# Patient Record
Sex: Male | Born: 1950 | Race: White | Hispanic: No | Marital: Married | Smoking: Former smoker
Health system: Southern US, Community
[De-identification: ages and names within clinical notes are randomized; demographics above are authoritative.]

## PROBLEM LIST (undated history)

## (undated) DIAGNOSIS — I499 Cardiac arrhythmia, unspecified: Secondary | ICD-10-CM

## (undated) DIAGNOSIS — E119 Type 2 diabetes mellitus without complications: Secondary | ICD-10-CM

## (undated) DIAGNOSIS — E785 Hyperlipidemia, unspecified: Secondary | ICD-10-CM

## (undated) DIAGNOSIS — I779 Disorder of arteries and arterioles, unspecified: Secondary | ICD-10-CM

## (undated) DIAGNOSIS — Z87442 Personal history of urinary calculi: Secondary | ICD-10-CM

## (undated) DIAGNOSIS — I2510 Atherosclerotic heart disease of native coronary artery without angina pectoris: Secondary | ICD-10-CM

## (undated) DIAGNOSIS — I639 Cerebral infarction, unspecified: Secondary | ICD-10-CM

## (undated) DIAGNOSIS — E7800 Pure hypercholesterolemia, unspecified: Secondary | ICD-10-CM

## (undated) DIAGNOSIS — K469 Unspecified abdominal hernia without obstruction or gangrene: Secondary | ICD-10-CM

## (undated) DIAGNOSIS — I10 Essential (primary) hypertension: Secondary | ICD-10-CM

## (undated) DIAGNOSIS — K449 Diaphragmatic hernia without obstruction or gangrene: Secondary | ICD-10-CM

## (undated) DIAGNOSIS — J939 Pneumothorax, unspecified: Secondary | ICD-10-CM

## (undated) DIAGNOSIS — N529 Male erectile dysfunction, unspecified: Secondary | ICD-10-CM

## (undated) DIAGNOSIS — M5090 Cervical disc disorder, unspecified, unspecified cervical region: Secondary | ICD-10-CM

## (undated) DIAGNOSIS — Z951 Presence of aortocoronary bypass graft: Secondary | ICD-10-CM

## (undated) DIAGNOSIS — M1990 Unspecified osteoarthritis, unspecified site: Secondary | ICD-10-CM

## (undated) DIAGNOSIS — J449 Chronic obstructive pulmonary disease, unspecified: Secondary | ICD-10-CM

## (undated) HISTORY — DX: Essential (primary) hypertension: I10

## (undated) HISTORY — PX: CORONARY ARTERY BYPASS GRAFT: SHX141

## (undated) HISTORY — DX: Male erectile dysfunction, unspecified: N52.9

## (undated) HISTORY — DX: Cervical disc disorder, unspecified, unspecified cervical region: M50.90

## (undated) HISTORY — DX: Hyperlipidemia, unspecified: E78.5

## (undated) HISTORY — DX: Disorder of arteries and arterioles, unspecified: I77.9

## (undated) HISTORY — PX: HEMORROIDECTOMY: SUR656

## (undated) HISTORY — DX: Type 2 diabetes mellitus without complications: E11.9

## (undated) HISTORY — PX: CAROTID ARTERY ANGIOPLASTY: SHX1300

## (undated) HISTORY — DX: Diaphragmatic hernia without obstruction or gangrene: K44.9

---

## 2002-12-23 ENCOUNTER — Emergency Department (HOSPITAL_COMMUNITY): Admission: EM | Admit: 2002-12-23 | Discharge: 2002-12-23 | Payer: Self-pay

## 2003-08-18 ENCOUNTER — Ambulatory Visit (HOSPITAL_COMMUNITY): Admission: RE | Admit: 2003-08-18 | Discharge: 2003-08-18 | Payer: Self-pay | Admitting: Internal Medicine

## 2004-12-10 ENCOUNTER — Ambulatory Visit (HOSPITAL_COMMUNITY): Admission: RE | Admit: 2004-12-10 | Discharge: 2004-12-10 | Payer: Self-pay | Admitting: Orthopaedic Surgery

## 2005-05-08 ENCOUNTER — Other Ambulatory Visit: Admission: RE | Admit: 2005-05-08 | Discharge: 2005-05-08 | Payer: Self-pay | Admitting: Family Medicine

## 2008-04-01 ENCOUNTER — Observation Stay (HOSPITAL_COMMUNITY): Admission: RE | Admit: 2008-04-01 | Discharge: 2008-04-02 | Payer: Self-pay | Admitting: General Surgery

## 2008-04-01 ENCOUNTER — Encounter (INDEPENDENT_AMBULATORY_CARE_PROVIDER_SITE_OTHER): Payer: Self-pay | Admitting: General Surgery

## 2008-08-19 DIAGNOSIS — Z8673 Personal history of transient ischemic attack (TIA), and cerebral infarction without residual deficits: Secondary | ICD-10-CM

## 2008-08-19 HISTORY — DX: Personal history of transient ischemic attack (TIA), and cerebral infarction without residual deficits: Z86.73

## 2008-11-22 ENCOUNTER — Ambulatory Visit (HOSPITAL_COMMUNITY): Admission: RE | Admit: 2008-11-22 | Discharge: 2008-11-22 | Payer: Self-pay | Admitting: Family Medicine

## 2008-11-24 ENCOUNTER — Encounter: Payer: Self-pay | Admitting: Family Medicine

## 2008-12-01 ENCOUNTER — Encounter: Admission: RE | Admit: 2008-12-01 | Discharge: 2008-12-01 | Payer: Self-pay | Admitting: Cardiovascular Disease

## 2008-12-02 ENCOUNTER — Ambulatory Visit (HOSPITAL_COMMUNITY): Admission: RE | Admit: 2008-12-02 | Discharge: 2008-12-02 | Payer: Self-pay | Admitting: Cardiovascular Disease

## 2008-12-20 ENCOUNTER — Ambulatory Visit (HOSPITAL_COMMUNITY): Admission: RE | Admit: 2008-12-20 | Discharge: 2008-12-21 | Payer: Self-pay | Admitting: Interventional Radiology

## 2009-01-03 ENCOUNTER — Encounter: Payer: Self-pay | Admitting: Interventional Radiology

## 2009-03-27 ENCOUNTER — Ambulatory Visit (HOSPITAL_COMMUNITY): Admission: RE | Admit: 2009-03-27 | Discharge: 2009-03-27 | Payer: Self-pay | Admitting: Interventional Radiology

## 2009-03-31 ENCOUNTER — Ambulatory Visit: Payer: Self-pay | Admitting: Thoracic Surgery (Cardiothoracic Vascular Surgery)

## 2009-04-04 ENCOUNTER — Encounter: Payer: Self-pay | Admitting: Thoracic Surgery (Cardiothoracic Vascular Surgery)

## 2009-04-05 ENCOUNTER — Encounter
Admission: RE | Admit: 2009-04-05 | Discharge: 2009-04-05 | Payer: Self-pay | Admitting: Thoracic Surgery (Cardiothoracic Vascular Surgery)

## 2009-04-06 ENCOUNTER — Ambulatory Visit
Admission: RE | Admit: 2009-04-06 | Discharge: 2009-04-06 | Payer: Self-pay | Admitting: Thoracic Surgery (Cardiothoracic Vascular Surgery)

## 2009-04-06 ENCOUNTER — Inpatient Hospital Stay (HOSPITAL_COMMUNITY)
Admission: RE | Admit: 2009-04-06 | Discharge: 2009-04-10 | Payer: Self-pay | Admitting: Thoracic Surgery (Cardiothoracic Vascular Surgery)

## 2009-04-06 ENCOUNTER — Ambulatory Visit: Payer: Self-pay | Admitting: Thoracic Surgery (Cardiothoracic Vascular Surgery)

## 2009-05-01 ENCOUNTER — Encounter
Admission: RE | Admit: 2009-05-01 | Discharge: 2009-05-01 | Payer: Self-pay | Admitting: Thoracic Surgery (Cardiothoracic Vascular Surgery)

## 2009-05-01 ENCOUNTER — Ambulatory Visit: Payer: Self-pay | Admitting: Thoracic Surgery (Cardiothoracic Vascular Surgery)

## 2009-05-15 ENCOUNTER — Ambulatory Visit: Payer: Self-pay | Admitting: Thoracic Surgery (Cardiothoracic Vascular Surgery)

## 2009-05-15 ENCOUNTER — Encounter
Admission: RE | Admit: 2009-05-15 | Discharge: 2009-05-15 | Payer: Self-pay | Admitting: Thoracic Surgery (Cardiothoracic Vascular Surgery)

## 2009-05-23 ENCOUNTER — Ambulatory Visit (HOSPITAL_COMMUNITY): Admission: RE | Admit: 2009-05-23 | Discharge: 2009-05-23 | Payer: Self-pay | Admitting: Family Medicine

## 2009-06-20 ENCOUNTER — Ambulatory Visit: Payer: Self-pay | Admitting: Thoracic Surgery (Cardiothoracic Vascular Surgery)

## 2009-06-20 ENCOUNTER — Encounter: Admission: RE | Admit: 2009-06-20 | Discharge: 2009-06-20 | Payer: Self-pay | Admitting: Thoracic Surgery

## 2009-09-25 ENCOUNTER — Ambulatory Visit: Payer: Self-pay | Admitting: Thoracic Surgery (Cardiothoracic Vascular Surgery)

## 2010-09-09 ENCOUNTER — Encounter: Payer: Self-pay | Admitting: Family Medicine

## 2010-11-24 LAB — POCT I-STAT 3, ART BLOOD GAS (G3+)
Acid-Base Excess: 1 mmol/L (ref 0.0–2.0)
Acid-Base Excess: 1 mmol/L (ref 0.0–2.0)
Acid-Base Excess: 2 mmol/L (ref 0.0–2.0)
Bicarbonate: 26.4 mEq/L — ABNORMAL HIGH (ref 20.0–24.0)
Bicarbonate: 27.6 mEq/L — ABNORMAL HIGH (ref 20.0–24.0)
Bicarbonate: 28.9 mEq/L — ABNORMAL HIGH (ref 20.0–24.0)
Bicarbonate: 31.1 mEq/L — ABNORMAL HIGH (ref 20.0–24.0)
O2 Saturation: 100 %
O2 Saturation: 100 %
O2 Saturation: 100 %
O2 Saturation: 96 %
O2 Saturation: 96 %
O2 Saturation: 98 %
Patient temperature: 38.1
Patient temperature: 38.2
TCO2: 28 mmol/L (ref 0–100)
TCO2: 29 mmol/L (ref 0–100)
TCO2: 30 mmol/L (ref 0–100)
TCO2: 33 mmol/L (ref 0–100)
pCO2 arterial: 47.1 mmHg — ABNORMAL HIGH (ref 35.0–45.0)
pCO2 arterial: 49.1 mmHg — ABNORMAL HIGH (ref 35.0–45.0)
pCO2 arterial: 58.1 mmHg (ref 35.0–45.0)
pH, Arterial: 7.308 — ABNORMAL LOW (ref 7.350–7.450)
pH, Arterial: 7.372 (ref 7.350–7.450)
pH, Arterial: 7.376 (ref 7.350–7.450)
pH, Arterial: 7.377 (ref 7.350–7.450)
pO2, Arterial: 102 mmHg — ABNORMAL HIGH (ref 80.0–100.0)
pO2, Arterial: 270 mmHg — ABNORMAL HIGH (ref 80.0–100.0)
pO2, Arterial: 92 mmHg (ref 80.0–100.0)

## 2010-11-24 LAB — BASIC METABOLIC PANEL
BUN: 14 mg/dL (ref 6–23)
BUN: 20 mg/dL (ref 6–23)
BUN: 8 mg/dL (ref 6–23)
BUN: 13 mg/dL (ref 6–23)
CO2: 27 mEq/L (ref 19–32)
CO2: 32 mEq/L (ref 19–32)
Calcium: 7.9 mg/dL — ABNORMAL LOW (ref 8.4–10.5)
Calcium: 8.5 mg/dL (ref 8.4–10.5)
Calcium: 9 mg/dL (ref 8.4–10.5)
Chloride: 102 mEq/L (ref 96–112)
Chloride: 105 mEq/L (ref 96–112)
Creatinine, Ser: 0.9 mg/dL (ref 0.4–1.5)
GFR calc non Af Amer: >60 mL/min (ref >60)
GFR calc non Af Amer: >60 mL/min (ref >60)
Glucose, Bld: 126 mg/dL — ABNORMAL HIGH (ref 70–99)
Glucose, Bld: 181 mg/dL — ABNORMAL HIGH (ref 70–99)
Glucose, Bld: 166 mg/dL — ABNORMAL HIGH (ref 70–99)
Potassium: 3.9 mEq/L (ref 3.5–5.1)
Sodium: 134 mEq/L — ABNORMAL LOW (ref 135–145)
Sodium: 139 mEq/L (ref 135–145)

## 2010-11-24 LAB — CBC
HCT: 31.8 % — ABNORMAL LOW (ref 39.0–52.0)
HCT: 32.8 % — ABNORMAL LOW (ref 39.0–52.0)
HCT: 32.9 % — ABNORMAL LOW (ref 39.0–52.0)
HCT: 33 % — ABNORMAL LOW (ref 39.0–52.0)
HCT: 43.8 % (ref 39.0–52.0)
HCT: 45.4 % (ref 39.0–52.0)
Hemoglobin: 11.1 g/dL — ABNORMAL LOW (ref 13.0–17.0)
Hemoglobin: 11.3 g/dL — ABNORMAL LOW (ref 13.0–17.0)
Hemoglobin: 11.5 g/dL — ABNORMAL LOW (ref 13.0–17.0)
Hemoglobin: 15.3 g/dL (ref 13.0–17.0)
MCHC: 34.8 g/dL (ref 30.0–36.0)
MCHC: 34.8 g/dL (ref 30.0–36.0)
MCHC: 35 g/dL (ref 30.0–36.0)
MCHC: 35.2 g/dL (ref 30.0–36.0)
MCHC: 35.1 g/dL (ref 30.0–36.0)
MCV: 95.8 fL (ref 78.0–100.0)
MCV: 95.9 fL (ref 78.0–100.0)
MCV: 98.2 fL (ref 78.0–100.0)
MCV: 95.5 fL (ref 78.0–100.0)
Platelets: 110 10*3/uL — ABNORMAL LOW (ref 150–400)
Platelets: 126 10*3/uL — ABNORMAL LOW (ref 150–400)
Platelets: 147 10*3/uL — ABNORMAL LOW (ref 150–400)
Platelets: 149 10*3/uL — ABNORMAL LOW (ref 150–400)
Platelets: 223 10*3/uL (ref 150–400)
Platelets: 222 10*3/uL (ref 150–400)
RBC: 3.36 MIL/uL — ABNORMAL LOW (ref 4.22–5.81)
RBC: 4.57 MIL/uL (ref 4.22–5.81)
RDW: 12.9 % (ref 11.5–15.5)
RDW: 12.9 % (ref 11.5–15.5)
RDW: 13.2 % (ref 11.5–15.5)
RDW: 13.2 % (ref 11.5–15.5)
RDW: 13.4 % (ref 11.5–15.5)
RDW: 13.1 % (ref 11.5–15.5)
WBC: 14.9 10*3/uL — ABNORMAL HIGH (ref 4.0–10.5)
WBC: 15.2 10*3/uL — ABNORMAL HIGH (ref 4.0–10.5)
WBC: 9.1 10*3/uL (ref 4.0–10.5)

## 2010-11-24 LAB — BLOOD GAS, ARTERIAL
Acid-Base Excess: 3.8 mmol/L — ABNORMAL HIGH (ref 0.0–2.0)
Bicarbonate: 27.9 mEq/L — ABNORMAL HIGH (ref 20.0–24.0)
Drawn by: 206361
FIO2: 0.21 %
O2 Saturation: 96.9 %
Patient temperature: 98.6
TCO2: 29.2 mmol/L (ref 0–100)
pCO2 arterial: 42.9 mmHg (ref 35.0–45.0)
pH, Arterial: 7.429 (ref 7.350–7.450)
pO2, Arterial: 83 mmHg (ref 80.0–100.0)

## 2010-11-24 LAB — GLUCOSE, CAPILLARY
Glucose-Capillary: 109 mg/dL — ABNORMAL HIGH (ref 70–99)
Glucose-Capillary: 113 mg/dL — ABNORMAL HIGH (ref 70–99)
Glucose-Capillary: 126 mg/dL — ABNORMAL HIGH (ref 70–99)
Glucose-Capillary: 126 mg/dL — ABNORMAL HIGH (ref 70–99)
Glucose-Capillary: 127 mg/dL — ABNORMAL HIGH (ref 70–99)
Glucose-Capillary: 142 mg/dL — ABNORMAL HIGH (ref 70–99)
Glucose-Capillary: 142 mg/dL — ABNORMAL HIGH (ref 70–99)
Glucose-Capillary: 152 mg/dL — ABNORMAL HIGH (ref 70–99)
Glucose-Capillary: 162 mg/dL — ABNORMAL HIGH (ref 70–99)
Glucose-Capillary: 206 mg/dL — ABNORMAL HIGH (ref 70–99)
Glucose-Capillary: 87 mg/dL (ref 70–99)
Glucose-Capillary: 94 mg/dL (ref 70–99)

## 2010-11-24 LAB — URINALYSIS, ROUTINE W REFLEX MICROSCOPIC
Hgb urine dipstick: NEGATIVE
Leukocytes, UA: NEGATIVE
Nitrite: NEGATIVE
Protein, ur: NEGATIVE mg/dL
Specific Gravity, Urine: 1.021 (ref 1.005–1.030)
Urobilinogen, UA: 0.2 mg/dL (ref 0.0–1.0)

## 2010-11-24 LAB — POCT I-STAT 4, (NA,K, GLUC, HGB,HCT)
Glucose, Bld: 172 mg/dL — ABNORMAL HIGH (ref 70–99)
Glucose, Bld: 172 mg/dL — ABNORMAL HIGH (ref 70–99)
Glucose, Bld: 180 mg/dL — ABNORMAL HIGH (ref 70–99)
Glucose, Bld: 194 mg/dL — ABNORMAL HIGH (ref 70–99)
HCT: 29 % — ABNORMAL LOW (ref 39.0–52.0)
HCT: 31 % — ABNORMAL LOW (ref 39.0–52.0)
HCT: 32 % — ABNORMAL LOW (ref 39.0–52.0)
HCT: 42 % (ref 39.0–52.0)
Hemoglobin: 10.5 g/dL — ABNORMAL LOW (ref 13.0–17.0)
Hemoglobin: 10.9 g/dL — ABNORMAL LOW (ref 13.0–17.0)
Hemoglobin: 13.6 g/dL (ref 13.0–17.0)
Hemoglobin: 14.3 g/dL (ref 13.0–17.0)
Potassium: 3.6 mEq/L (ref 3.5–5.1)
Potassium: 4.4 mEq/L (ref 3.5–5.1)
Potassium: 4.5 mEq/L (ref 3.5–5.1)
Sodium: 128 mEq/L — ABNORMAL LOW (ref 135–145)
Sodium: 132 mEq/L — ABNORMAL LOW (ref 135–145)
Sodium: 134 mEq/L — ABNORMAL LOW (ref 135–145)
Sodium: 137 mEq/L (ref 135–145)

## 2010-11-24 LAB — COMPREHENSIVE METABOLIC PANEL
ALT: 21 U/L (ref 0–53)
AST: 20 U/L (ref 0–37)
Albumin: 3.8 g/dL (ref 3.5–5.2)
CO2: 24 mEq/L (ref 19–32)
Calcium: 9.5 mg/dL (ref 8.4–10.5)
Chloride: 105 mEq/L (ref 96–112)
GFR calc Af Amer: >60 mL/min (ref >60)
GFR calc non Af Amer: >60 mL/min (ref >60)
Sodium: 138 mEq/L (ref 135–145)

## 2010-11-24 LAB — POCT I-STAT, CHEM 8
BUN: 9 mg/dL (ref 6–23)
Chloride: 98 mEq/L (ref 96–112)
Creatinine, Ser: 0.9 mg/dL (ref 0.4–1.5)
Glucose, Bld: 160 mg/dL — ABNORMAL HIGH (ref 70–99)
HCT: 34 % — ABNORMAL LOW (ref 39.0–52.0)
Hemoglobin: 11.2 g/dL — ABNORMAL LOW (ref 13.0–17.0)
Hemoglobin: 11.6 g/dL — ABNORMAL LOW (ref 13.0–17.0)
Potassium: 4 mEq/L (ref 3.5–5.1)
Sodium: 136 mEq/L (ref 135–145)
Sodium: 140 mEq/L (ref 135–145)
TCO2: 25 mmol/L (ref 0–100)

## 2010-11-24 LAB — POCT I-STAT 3, VENOUS BLOOD GAS (G3P V)
Bicarbonate: 28.2 mEq/L — ABNORMAL HIGH (ref 20.0–24.0)
O2 Saturation: 75 %
TCO2: 30 mmol/L (ref 0–100)
pCO2, Ven: 52.4 mmHg — ABNORMAL HIGH (ref 45.0–50.0)
pH, Ven: 7.338 — ABNORMAL HIGH (ref 7.250–7.300)

## 2010-11-24 LAB — CREATININE, SERUM
Creatinine, Ser: 0.82 mg/dL (ref 0.4–1.5)
Creatinine, Ser: 0.87 mg/dL (ref 0.4–1.5)
GFR calc Af Amer: >60 mL/min (ref >60)
GFR calc non Af Amer: >60 mL/min (ref >60)
GFR calc non Af Amer: >60 mL/min (ref >60)

## 2010-11-24 LAB — PROTIME-INR
INR: 1 (ref 0.00–1.49)
INR: 1.3 (ref 0.00–1.49)
Prothrombin Time: 16.5 seconds — ABNORMAL HIGH (ref 11.6–15.2)

## 2010-11-24 LAB — URINE MICROSCOPIC-ADD ON

## 2010-11-24 LAB — ABO/RH: ABO/RH(D): A POS

## 2010-11-24 LAB — TYPE AND SCREEN: ABO/RH(D): A POS

## 2010-11-24 LAB — MAGNESIUM
Magnesium: 2.3 mg/dL (ref 1.5–2.5)
Magnesium: 2.6 mg/dL — ABNORMAL HIGH (ref 1.5–2.5)

## 2010-11-24 LAB — HEMOGLOBIN AND HEMATOCRIT, BLOOD: HCT: 29.9 % — ABNORMAL LOW (ref 39.0–52.0)

## 2010-11-24 LAB — PLATELET COUNT: Platelets: 163 10*3/uL (ref 150–400)

## 2010-11-27 LAB — CBC
HCT: 43.9 % (ref 39.0–52.0)
MCV: 97.9 fL (ref 78.0–100.0)
Platelets: 219 10*3/uL (ref 150–400)
WBC: 9.8 10*3/uL (ref 4.0–10.5)

## 2010-11-27 LAB — BASIC METABOLIC PANEL
BUN: 7 mg/dL (ref 6–23)
Chloride: 103 mEq/L (ref 96–112)
Glucose, Bld: 135 mg/dL — ABNORMAL HIGH (ref 70–99)
Potassium: 3.8 mEq/L (ref 3.5–5.1)

## 2010-11-27 LAB — URINALYSIS, DIPSTICK ONLY
Glucose, UA: NEGATIVE mg/dL
Ketones, ur: 15 mg/dL — AB
Protein, ur: NEGATIVE mg/dL

## 2010-11-27 LAB — HEPARIN LEVEL (UNFRACTIONATED): Heparin Unfractionated: <0.1 IU/mL — ABNORMAL LOW (ref 0.30–0.70)

## 2010-11-28 LAB — HEPATIC FUNCTION PANEL
ALT: 33 U/L (ref 0–53)
AST: 27 U/L (ref 0–37)
Albumin: 3.6 g/dL (ref 3.5–5.2)
Alkaline Phosphatase: 67 U/L (ref 39–117)
Total Bilirubin: 1.8 mg/dL — ABNORMAL HIGH (ref 0.3–1.2)
Total Protein: 6.4 g/dL (ref 6.0–8.3)

## 2010-11-28 LAB — CBC
HCT: 50.4 % (ref 39.0–52.0)
Hemoglobin: 17.5 g/dL — ABNORMAL HIGH (ref 13.0–17.0)
RBC: 5.12 MIL/uL (ref 4.22–5.81)
RDW: 12.4 % (ref 11.5–15.5)
WBC: 9.6 10*3/uL (ref 4.0–10.5)

## 2010-11-28 LAB — BASIC METABOLIC PANEL
Calcium: 10.2 mg/dL (ref 8.4–10.5)
GFR calc Af Amer: >60 mL/min (ref >60)
GFR calc non Af Amer: >60 mL/min (ref >60)
Potassium: 5.8 mEq/L — ABNORMAL HIGH (ref 3.5–5.1)
Sodium: 139 mEq/L (ref 135–145)

## 2010-11-28 LAB — LIPID PANEL
Cholesterol: 205 mg/dL — ABNORMAL HIGH (ref 0–200)
HDL: 27 mg/dL — ABNORMAL LOW (ref >39)
Total CHOL/HDL Ratio: 7.6 RATIO
VLDL: 48 mg/dL — ABNORMAL HIGH (ref 0–40)

## 2010-11-28 LAB — DIFFERENTIAL
Basophils Absolute: 0.1 10*3/uL (ref 0.0–0.1)
Eosinophils Relative: 7 % — ABNORMAL HIGH (ref 0–5)
Lymphocytes Relative: 20 % (ref 12–46)
Lymphs Abs: 1.9 10*3/uL (ref 0.7–4.0)
Monocytes Absolute: 1 10*3/uL (ref 0.1–1.0)
Neutro Abs: 6 10*3/uL (ref 1.7–7.7)

## 2010-11-28 LAB — PROTIME-INR: Prothrombin Time: 13.7 seconds (ref 11.6–15.2)

## 2010-11-28 LAB — APTT: aPTT: 30 seconds (ref 24–37)
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
## 2011-01-01 NOTE — Op Note (Signed)
Mark Rios, Mark Rios NO.:  [id number]   MEDICAL RECORD NO.:  [id number]          PATIENT TYPE:  INP   LOCATION:  2305                         FACILITY:  [facility]   PHYSICIAN:  Guadalupe Maple, M.D.  DATE OF BIRTH:  1951/03/19   DATE OF PROCEDURE:  04/06/2009  DATE OF DISCHARGE:                               OPERATIVE REPORT   PROCEDURE:  Intraoperative transesophageal echocardiography.   [name] is a 60 year old white male with a history of severe  coronary artery disease and left ventricular dysfunction who is  scheduled to undergo coronary artery bypass grafting by Dr. Dorris Fetch.  Intraoperative transesophageal echocardiography was requested to  evaluate the left and right ventricular function to serve as a monitor  for intraoperative volume status and to assess for any valvular  pathology.   The patient was brought to the operating room at [facility] and  following uneventful induction of general anesthesia and endotracheal  intubation, the transesophageal echocardiography probe was inserted into  the esophagus without difficulty.   IMPRESSION:  Prebypass Findings:  1. Aortic valve.  The aortic valve was trileaflet and opened normally.      There were no calcification of the leaflets, there was no evidence      of aortic stenosis, and no evidence of aortic insufficiency.  2. Mitral valve.  There was trace mitral insufficiency.  The leaflets      coapted well without prolapse or fluttering.  There was mild      thickening of the leaflets, but the valve otherwise appeared      normal.  3. Left ventricle.  There appeared to be diffuse hypokinesis of the      left ventricular myocardium, which appeared nonfocal.  Ejection      fraction was estimated to 25% to 30%.  Contractility appeared      reduced in all segments.  There was no thrombus noted in the left      ventricular apex.  Left ventricular wall thickness measured 0.9 to      1.0 cm at  the end diastole at the mid papillary level      concentrically.  4. Right ventricle.  The right ventricular function appeared to be      within normal limits.  There was good contractility of the right      ventricular free wall.  5. Tricuspid valve.  There was no tricuspid insufficiency that could      be appreciated and the valve leaflets appeared normal.  6. Interatrial septum.  The interatrial septum was intact without      evidence of patent foramen ovale or atrial septal defect by color      Doppler and bubble study.  7. Left atrium.  There was no thrombus noted in the left atrium or      left atrial appendage.  8. Ascending aorta.  The ascending aorta showed a well-defined aortic      root and sinotubular ridge and there did not appear to be any      severe atheromatous plaques noted or  mobile plaques appreciated.  9. Descending aorta.  The descending aorta appeared normal with no      atheromatous disease appreciated and measured 1.9 to 2.0 cm in      diameter.   Postbypass Findings:  1. Aortic valve.  The aortic valve was unchanged from the prebypass      study.  There was no aortic insufficiency and the valve opened      normally.  2. Mitral valve.  The mitral valve again showed trace mitral      insufficiency with good coaptation of the leaflets and no prolapse      or fluttering.  3. Left ventricle.  Again the left ventricular function was diminished      and there appeared to be global hypokinesis of the left ventricular      myocardium and ejection fraction was estimated at 30% to 35%.  4. Right ventricle.  There was good contractility of the right      ventricular free wall and normal-appearing right      ventricular function.  5. Tricuspid valve.  The tricuspid valve was unchanged from the      prebypass study.  There was no tricuspid insufficiency appreciated.           ______________________________  Guadalupe Maple, M.D.     DCJ/MEDQ  D:  04/06/2009  T:   04/07/2009  Job:  [id number]
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
## 2011-01-01 NOTE — Op Note (Signed)
NAMELAKEEM, ROZO NO.:  [id number]   MEDICAL RECORD NO.:  [id number]          PATIENT TYPE:  OBV   LOCATION:  A313                          FACILITY:  [facility]   PHYSICIAN:  Barbaraann Barthel, M.D. DATE OF BIRTH:  1950-10-14   DATE OF PROCEDURE:  04/01/2008  DATE OF DISCHARGE:                               OPERATIVE REPORT   This is 60 year old white male who had prolapsed bleeding internal  hemorrhoids.  These were treated with vigorous nonoperative care as an  outpatient.  He had really very little response to treatment.  He has  had no previous colonoscopy.  We tried to allow the inflammation to  subside somewhat with vigorous treatment a week or 4-5 days  preoperatively.  There was some response, but not very much.  Certainly,  we could not get a colonoscopy due to the amount of inflammation that  will be addressed later after we take care of this acute problem.   GROSS OPERATIVE FINDINGS:  The patient had some prolapsed bleeding with  focal necrosis area of hemorrhoids primarily in the left lateral bundle  with some perianal inflammation, it was subsiding.   Rigid proctoscopy was performed to 20 cm, this was normal other than the  inflammation at hand.   PROCEDURE:  1. Rigid proctoscopy.  2. Internal hemorrhoidectomy.   SPECIMEN:  Left lateral bundle hemorrhoid.   WOUND CLASSIFICATION:  Contaminated.   TECHNIQUE:  The patient was placed in the jackknife prone position.  The  buttocks were separated with tape.  Digital examination proctoscopy was  performed to 20 cm.  There were no abnormalities beyond the area that  was acutely inflamed to my vision.  We then prepped and draped in the  usual manner with Betadine and then irrigated his rectum as well and  then dissected then clamped the largest hemorrhoid which really took  care of most of the inflammation on the left lateral side and these were  clamped with a Buie hemorrhoidal clamp and removed and  oversewn doubly  using 2-0 gut suture.  This really improved the area quite a bit.  I did  not feel like any further  dissection was necessary in order to avoid any possibility of anal  stenosis.  We irrigated.  I used 0.5% Sensorcaine for perianal block and  used a dressing with 2% viscous Xylocaine for comfort measures.  The  patient will be admitted for observation, we will discharge him in the  a.m.      Barbaraann Barthel, M.D.  Electronically Signed     WB/MEDQ  D:  04/01/2008  T:  04/02/2008  Job:  [id number]   cc:   Angus G. Renard Matter, MD  Fax: [phone]

## 2011-01-01 NOTE — Assessment & Plan Note (Signed)
OFFICE VISIT   Mark, Rios  DOB:  [birth year]                                        May 01, 2009  CHART #:  [id number]   HISTORY:  The patient is a 60 year old gentleman who underwent coronary  bypass grafting x3 on August 19.  Postoperatively, he did well.  He is  not having significant complications.  He now returns for his scheduled  postoperative followup visit.  He says that he has been having pain.  He  has been taking 2-3 hydrocodone a day.  He has had some trouble  sleeping.  He had difficulty lying flat.  He has had some cough.  His  appetite has been poor.  He has been walking on a regular basis, but his  wife says he sleeps all the time and he is generally tired.   CURRENT MEDICATIONS:  Aspirin 325 mg daily, Plavix 75 mg daily,  simvastatin 40 mg daily, Niaspan 500 mg daily, metformin 500 mg b.i.d.,  Coreg 12.5 mg b.i.d., and p.r.n. Lortab 7.5/500.   ALLERGIES:  He has no known drug allergies.   PHYSICAL EXAMINATION:  The patient is a 60 year old gentleman in no  acute distress.  Blood pressure is 139/85, pulse 80, respirations were  18.  His oxygen saturation is 98% on room air.  His general affect is  somewhat flat.  Neurologically, he has no focal motor deficits.  He does  have some numbness on the fourth and fifth fingers of the left hand.  His sternal wound is healing well and the sternum is stable.  Chest tube  sites are healing well.  His leg incisions are healing well.  He does  have some hematoma along this saphenous venectomy tract.  Lungs have  diminished breath sounds at the left base.   IMAGING:  His chest x-ray shows a moderate left pleural effusion.   IMPRESSION:  The patient is a 60 year old gentleman.  He is now about 3  weeks out from surgery, and he is sort of at the low point in the  recovery phase.  I did encourage him to see some significant improvement  over the next 2-3 weeks.  He is still requiring some  pain medication.  I  gave him a prescription for additional 40 Lortab 7.5/500 tablets, he can  take one of those 3 times a day as needed for pain.  I discussed our  options of thoracentesis versus trying a steroid taper for his pleural  effusion biopsy.  Particularly with him on Plavix, I think it could be  reasonable to try a steroid taper first.  I am going to give him taper  from 50 mg to 10 mg over a 5-day period and then off.  He is still not  to lift any heavy objects, and he should not drive a car until he sees  me back in 2 weeks, at which time we will check another chest x-ray to  check on his progress.  Overall, I think the patient is doing fine.  He  should recover well, but he is still sort of at the nadir of the  recovery process at the present time.   Mark Rios, M.D.  Electronically Signed   SCH/MEDQ  D:  05/01/2009  T:  05/02/2009  Job:  [id number]   cc:  [name], M.D.  Mila Homer. Sudie Bailey, M.D.
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
## 2011-01-01 NOTE — Assessment & Plan Note (Signed)
OFFICE VISIT   Mark, Rios  DOB:  1950-10-09                                        June 20, 2009  CHART #:  [id number]   HISTORY OF PRESENT ILLNESS:  The patient returns today for followup.  He  is a 60 year old gentleman who had coronary bypass grafting on April 06, 2009.  He has been followed since that time.  He states that  presently he is having still some discomfort primarily along the right  side a couple of inches away from the incision.  He also still gets  short of breath particularly if he is walking up an incline.  He has  seen Dr. Allyson Sabal since his surgery.  He says he has a stress test  scheduled in December.  He has not had any chest pain consistent with  angina since his operation.   PHYSICAL EXAMINATION:  The patient is a 60 year old gentleman.  He is  anxious but no acute distress.  Blood pressure 129/76, pulse 87,  respirations 18, his oxygen saturation is 99% on room air.  His lungs  have diminished breath sounds at the left base.  Cardiac exam has a  regular rate and rhythm.  Normal S1 and S2.  No rubs, murmurs, or  gallops.  The sternum is stable.  The incision is well healed.  There is  point tenderness along the costal margin to the right of the sternum  primarily around the third and fourth costal cartilages.   IMPRESSION:  The patient is a 60 year old gentleman who is status post  coronary bypass grafting.  He is still having some discomfort which I  think is costochondritis.  I recommended that he try either Aleve or  Advil for that.  He also was complaining of shortness of breath with  exertion.  This has really been persistent since the surgery.  It is not  anything new.  I think a big component of this is his rather large  diaphragmatic hernia.  He has got a large amount of fatty tissue visible  in the left pleural space on his CT scan which was done back in August.  I reviewed those films with him and his wife today,  so they could be  clear as to what we are dealing with.  Roughly half the volume in his  left chest is filled with fatty omental tissue.  I once again explained  to them I thought it was absolutely necessary to have this repaired to  avoid permanent loss of lung function, and my initial impression would  be best to do this from the chest so that any adhesion in the chest  could be taken down to allow the tissue to be reduced and then defect in  the diaphragm could be closed.  This is a central defect that is not  associated with the hiatus and there does not appear to be any vital  organs in the chest which would prefer more urgent operation.  He also  has an umbilical hernia that he has talked to Dr. Malvin Johns in [place]  about needing that repaired.  He is going to check with Dr. Malvin Johns to  see if he would be comfortable trying to take care of the diaphragmatic  hernia as well, potentially that could be done as a laparoscopic type  approach and then this umbilical hernia could be fixed at the same time.  Otherwise, I would be happy to take care of the diaphragmatic hernia  through a thoracoscopic approach.  The patient will call us back after  he has seen Dr. Malvin Johns and left [place] know how he would like to proceed.   Mark Rios, M.D.  Electronically Signed   SCH/MEDQ  D:  06/20/2009  T:  06/20/2009  Job:  [id number]   cc:   Mark Rios, M.D.  Mark Rios, M.D.

## 2011-01-01 NOTE — Assessment & Plan Note (Signed)
OFFICE VISIT   Mark Rios, Mark Rios  DOB:  November 23, 1950                                        May 15, 2009  CHART #:  [id number]   The patient returns for followup.  He had coronary bypass grafting on  April 06, 2009.  He was last seen in the office on May 01, 2009,  at which time, his chest x-ray showed moderate pleural effusion.  He has  a known diaphragmatic hernia that we diagnosed preoperatively over  there.  He was treated with steroids, but now returns for followup of  that.  The patient states that he is still having some significant  incisional pain.  He has been taking 1-2 hydrocodone daily and still is  a little tender in the chest.  He has had a cough with some mucus  production.  He denies any fevers or chills.   PHYSICAL EXAMINATION:  General:  The patient is a 60 year old gentleman  in no acute distress.  Vital Signs:  His blood pressure is 137/80, pulse  88, respirations 18, and his oxygen saturation is 98% on room air.  Lungs:  Diminished breath sounds at the left base with dullness to  percussion.  Cardiac:  Regular rate and rhythm.  Normal S1 and S2.  No  rubs or murmurs.  Extremities:  There is no peripheral edema.  Chest:  His incision is clean, dry, and intact.  The sternum is stable.   His chest x-ray shows a pleural effusion in association with a  preexisting diaphragmatic hernia.  I recommended to the patient that we  attempt to drain this fluid with thoracentesis.  The risk of bleeding  and pneumothorax were discussed.  He understands and accepts the risks  and agrees to proceed.   PROCEDURE NOTE:  Using sterile technique and 1% lidocaine local  anesthetic, attempts were made to aspirate a left pleural effusion and 2  different interspaces.  No fluid was obtained on either attempt.  The  patient did have some discomfort particularly with injection of  lidocaine, but otherwise tolerated well.   IMPRESSION:  The patient  is a 60 year old gentleman status post coronary  bypass grafting.  He has a left diaphragmatic hernia with some  peritoneal fat within the chest cavity, it appears that he has pleural  effusion on top of that, but an attempt to drain the pleural effusion  was unsuccessful.  I suspect the fluids are relatively loculated or  maybe displacements of normal location because of the diaphragmatic  hernia.  I recommended to the patient that we treat again with steroids.  I will see him back in about 5 weeks.  I once again discussed the need  for eventual repair of that hernia.  There is no vital organs within the  hernia sac at present time, but that could be a subjective change, but  that is not an urgent procedure and can be done once he is fully  recovered from his bypass surgery.   I will plan to see the patient back in about 5 weeks.  We did give a  prescription for prednisone taper from 50 to 10 mg over 5 days.  He does  not need any additional pain medication at this time, but we will call  if he does need some.   Salvatore Decent  Dorris Fetch, M.D.  Electronically Signed   SCH/MEDQ  D:  05/15/2009  T:  05/16/2009  Job:  [id number]   cc:   [name], M.D.  Mila Homer. Sudie Bailey, M.D.

## 2011-05-17 LAB — BASIC METABOLIC PANEL
CO2: 31
Calcium: 9.1
Chloride: 103
Creatinine, Ser: 0.88
GFR calc Af Amer: >60
Sodium: 137

## 2011-05-17 LAB — CBC
Hemoglobin: 16.9
RBC: 4.9
WBC: 9.8

## 2011-05-17 LAB — DIFFERENTIAL
Lymphs Abs: 2.4
Monocytes Absolute: 0.9
Monocytes Relative: 9
Neutro Abs: 6.1
Neutrophils Relative %: 62

## 2013-03-01 ENCOUNTER — Emergency Department (HOSPITAL_COMMUNITY): Payer: Medicare Other

## 2013-03-01 ENCOUNTER — Encounter (HOSPITAL_COMMUNITY): Payer: Self-pay | Admitting: Emergency Medicine

## 2013-03-01 ENCOUNTER — Emergency Department (HOSPITAL_COMMUNITY)
Admission: EM | Admit: 2013-03-01 | Discharge: 2013-03-01 | Disposition: A | Discharge status: Home / Self Care | Payer: Medicare Other | Attending: Emergency Medicine | Admitting: Emergency Medicine

## 2013-03-01 DIAGNOSIS — E119 Type 2 diabetes mellitus without complications: Secondary | ICD-10-CM | POA: Insufficient documentation

## 2013-03-01 DIAGNOSIS — Z7982 Long term (current) use of aspirin: Secondary | ICD-10-CM | POA: Insufficient documentation

## 2013-03-01 DIAGNOSIS — R630 Anorexia: Secondary | ICD-10-CM | POA: Insufficient documentation

## 2013-03-01 DIAGNOSIS — R300 Dysuria: Secondary | ICD-10-CM | POA: Insufficient documentation

## 2013-03-01 DIAGNOSIS — R109 Unspecified abdominal pain: Secondary | ICD-10-CM

## 2013-03-01 DIAGNOSIS — I10 Essential (primary) hypertension: Secondary | ICD-10-CM | POA: Insufficient documentation

## 2013-03-01 DIAGNOSIS — F17200 Nicotine dependence, unspecified, uncomplicated: Secondary | ICD-10-CM | POA: Insufficient documentation

## 2013-03-01 DIAGNOSIS — R509 Fever, unspecified: Secondary | ICD-10-CM | POA: Insufficient documentation

## 2013-03-01 DIAGNOSIS — Z8709 Personal history of other diseases of the respiratory system: Secondary | ICD-10-CM | POA: Insufficient documentation

## 2013-03-01 DIAGNOSIS — R61 Generalized hyperhidrosis: Secondary | ICD-10-CM | POA: Insufficient documentation

## 2013-03-01 DIAGNOSIS — J449 Chronic obstructive pulmonary disease, unspecified: Secondary | ICD-10-CM | POA: Insufficient documentation

## 2013-03-01 DIAGNOSIS — Z8719 Personal history of other diseases of the digestive system: Secondary | ICD-10-CM | POA: Insufficient documentation

## 2013-03-01 DIAGNOSIS — G8929 Other chronic pain: Secondary | ICD-10-CM | POA: Insufficient documentation

## 2013-03-01 DIAGNOSIS — Z8679 Personal history of other diseases of the circulatory system: Secondary | ICD-10-CM | POA: Insufficient documentation

## 2013-03-01 DIAGNOSIS — H539 Unspecified visual disturbance: Secondary | ICD-10-CM | POA: Insufficient documentation

## 2013-03-01 DIAGNOSIS — Z79899 Other long term (current) drug therapy: Secondary | ICD-10-CM | POA: Insufficient documentation

## 2013-03-01 DIAGNOSIS — E7800 Pure hypercholesterolemia, unspecified: Secondary | ICD-10-CM | POA: Insufficient documentation

## 2013-03-01 DIAGNOSIS — J4489 Other specified chronic obstructive pulmonary disease: Secondary | ICD-10-CM | POA: Insufficient documentation

## 2013-03-01 HISTORY — DX: Type 2 diabetes mellitus without complications: E11.9

## 2013-03-01 HISTORY — DX: Essential (primary) hypertension: I10

## 2013-03-01 HISTORY — DX: Pure hypercholesterolemia, unspecified: E78.00

## 2013-03-01 HISTORY — DX: Unspecified abdominal hernia without obstruction or gangrene: K46.9

## 2013-03-01 HISTORY — DX: Cerebral infarction, unspecified: I63.9

## 2013-03-01 HISTORY — DX: Cardiac arrhythmia, unspecified: I49.9

## 2013-03-01 HISTORY — DX: Pneumothorax, unspecified: J93.9

## 2013-03-01 HISTORY — DX: Chronic obstructive pulmonary disease, unspecified: J44.9

## 2013-03-01 LAB — CBC WITH DIFFERENTIAL/PLATELET
Basophils Relative: 5 % — ABNORMAL HIGH (ref 0–1)
Eosinophils Absolute: 0.2 10*3/uL (ref 0.0–0.7)
Eosinophils Relative: 1 % (ref 0–5)
HCT: 44.7 % (ref 39.0–52.0)
Hemoglobin: 15.7 g/dL (ref 13.0–17.0)
Lymphs Abs: 6.3 10*3/uL — ABNORMAL HIGH (ref 0.7–4.0)
MCH: 31.6 pg (ref 26.0–34.0)
MCHC: 35.1 g/dL (ref 30.0–36.0)
MCV: 89.9 fL (ref 78.0–100.0)
Monocytes Absolute: 1.2 10*3/uL — ABNORMAL HIGH (ref 0.1–1.0)
Monocytes Relative: 11 % (ref 3–12)
RBC: 4.97 MIL/uL (ref 4.22–5.81)

## 2013-03-01 LAB — URINALYSIS, ROUTINE W REFLEX MICROSCOPIC: Glucose, UA: NEGATIVE mg/dL

## 2013-03-01 LAB — BASIC METABOLIC PANEL
BUN: 19 mg/dL (ref 6–23)
CO2: 27 mEq/L (ref 19–32)
Calcium: 9.3 mg/dL (ref 8.4–10.5)
Creatinine, Ser: 0.96 mg/dL (ref 0.50–1.35)
Glucose, Bld: 116 mg/dL — ABNORMAL HIGH (ref 70–99)
Sodium: 132 mEq/L — ABNORMAL LOW (ref 135–145)

## 2013-03-01 LAB — GLUCOSE, CAPILLARY: Glucose-Capillary: 107 mg/dL — ABNORMAL HIGH (ref 70–99)

## 2013-03-01 MED ORDER — IOHEXOL 300 MG/ML  SOLN
50.0000 mL | Freq: Once | INTRAMUSCULAR | Status: AC | PRN
  Administered 2013-03-01: 50 mL via ORAL

## 2013-03-01 MED ORDER — HYDROCODONE-ACETAMINOPHEN 5-325 MG PO TABS: 1.0000 | ORAL_TABLET | Freq: Four times a day (QID) | ORAL | Status: DC | PRN

## 2013-03-01 MED ORDER — IOHEXOL 300 MG/ML  SOLN
100.0000 mL | Freq: Once | INTRAMUSCULAR | Status: AC | PRN
  Administered 2013-03-01: 100 mL via INTRAVENOUS

## 2013-03-01 MED ORDER — SODIUM CHLORIDE 0.9 % IV SOLN: INTRAVENOUS | Status: DC

## 2013-03-01 MED ORDER — ONDANSETRON HCL 4 MG/2ML IJ SOLN
4.0000 mg | Freq: Once | INTRAMUSCULAR | Status: AC
  Administered 2013-03-01: 4 mg via INTRAVENOUS
  Filled 2013-03-01: qty 2

## 2013-03-01 MED ORDER — SODIUM CHLORIDE 0.9 % IV BOLUS (SEPSIS)
1000.0000 mL | Freq: Once | INTRAVENOUS | Status: AC
  Administered 2013-03-01: 1000 mL via INTRAVENOUS

## 2013-03-01 MED ORDER — HYDROMORPHONE HCL PF 1 MG/ML IJ SOLN
1.0000 mg | Freq: Once | INTRAMUSCULAR | Status: AC
  Administered 2013-03-01: 1 mg via INTRAVENOUS
  Filled 2013-03-01: qty 1

## 2013-03-01 NOTE — ED Notes (Addendum)
Patient c/o lower abd pain. Per patient has had abd pain x4 years but has gotten worse in past 2 weeks. Per patient decrease in appetite, "hot flashes, cold chills, and occasional dizzy spells." Patient reports abd hernia. Denies any nausea, vomiting, diarrhea, or constipation. Last BM this morning-per patient normal.

## 2013-03-01 NOTE — ED Notes (Signed)
Per wife patient is extremely sleepy with no energy recently. Per wife patient is usually very active.

## 2013-03-01 NOTE — ED Notes (Signed)
Patient would like to know results. MD at bedside.

## 2013-03-01 NOTE — ED Notes (Signed)
MD at bedside. 

## 2013-03-01 NOTE — ED Provider Notes (Signed)
History  This chart was scribed for Shelda Jakes, MD by Bennett Scrape, ED Scribe. This patient was seen in room [facility]09/[facility]09 and the patient's care was started at 9:03 AM.  CSN: [id number]  Arrival date & time 03/01/13  0825   First MD Initiated Contact with Patient 03/01/13 [phone]     Chief Complaint  Patient presents with  . Abdominal Pain    Patient is a 62 y.o. male presenting with abdominal pain. The history is provided by the patient. No language interpreter was used.  Abdominal Pain This is a chronic problem. The current episode started more than 1 week ago. The problem occurs constantly. The problem has been gradually worsening. Associated symptoms include abdominal pain. Pertinent negatives include no chest pain, no headaches and no shortness of breath. Exacerbated by: touch.    HPI Comments: Mark Rios is a 63 y.o. male with a h/o umbilical hernia x4 years who presents to the Emergency Department complaining of 2 weeks of worsening of chronic RLQ and periumblical abdominal pain. The pain occasionally radiates into the LLQ and he rates his pain a 6 out of 10 currently and a 10 out of 10 at its worst. He reports associated decreased appetite, chills, episodes of diaphoresis, blurred vision and subjective fevers since the onset. Pt denies any noticeable changes in the hernia since the onset. He reports that his PCP is aware of the hernia but has not referred him to a surgeon or GI specialist. He denies nausea, emesis, diarrhea and SOB as associated symptoms. He has a h/o DM, HTN and COPD. Pt is a current everyday smoker but denies alcohol use.  PCP is Dr. Sudie Bailey   Past Medical History  Diagnosis Date  . Abdominal hernia   . Diabetes mellitus without complication   . COPD (chronic obstructive pulmonary disease)   . Hypertension   . High cholesterol   . Stroke   . Arrhythmia   . Pneumothorax    Past Surgical History  Procedure Laterality Date  . Coronary artery  bypass graft      x3  . Hemorroidectomy     History reviewed. No pertinent family history. History  Substance Use Topics  . Smoking status: Current Some Day Smoker -- 48 years    Types: Cigarettes  . Smokeless tobacco: Never Used  . Alcohol Use: No    Review of Systems  Constitutional: Positive for fever, chills, diaphoresis and appetite change.  HENT: Negative for congestion, sore throat and neck pain.   Eyes: Positive for visual disturbance (blurred vision x2-3 weeks).  Respiratory: Negative for cough and shortness of breath.   Cardiovascular: Negative for chest pain and leg swelling.  Gastrointestinal: Positive for abdominal pain. Negative for nausea, vomiting and diarrhea.  Genitourinary: Positive for dysuria (mild). Negative for hematuria.  Musculoskeletal: Negative for back pain.  Skin: Negative for rash.  Neurological: Negative for headaches.  Hematological: Does not bruise/bleed easily.  Psychiatric/Behavioral: Negative for confusion.    Allergies  Review of patient's allergies indicates no known allergies.  Home Medications   Current Outpatient Rx  Name  Route  Sig  Dispense  Refill  . albuterol (PROVENTIL HFA;VENTOLIN HFA) 108 (90 BASE) MCG/ACT inhaler   Inhalation   Inhale 2 puffs into the lungs every 6 (six) hours as needed for wheezing or shortness of breath.         Marland Kitchen albuterol (PROVENTIL) (2.5 MG/3ML) 0.083% nebulizer solution   Nebulization   Take 2.5 mg by nebulization every  4 (four) hours as needed for wheezing.         Marland Kitchen aspirin 325 MG EC tablet   Oral   Take 325 mg by mouth daily.         . carvedilol (COREG) 12.5 MG tablet   Oral   Take 12.5 mg by mouth 2 (two) times daily with a meal.         . ipratropium (ATROVENT) 0.02 % nebulizer solution   Nebulization   Take 500 mcg by nebulization every 4 (four) hours as needed for wheezing.         Marland Kitchen lisinopril (PRINIVIL,ZESTRIL) 10 MG tablet   Oral   Take 10 mg by mouth daily.          . metFORMIN (GLUCOPHAGE) 500 MG tablet   Oral   Take 500 mg by mouth 2 (two) times daily with a meal.         . niacin (NIASPAN) 500 MG CR tablet   Oral   Take 500 mg by mouth at bedtime.         . simvastatin (ZOCOR) 40 MG tablet   Oral   Take 40 mg by mouth every evening.         Marland Kitchen HYDROcodone-acetaminophen (NORCO/VICODIN) 5-325 MG per tablet   Oral   Take 1-2 tablets by mouth every 6 (six) hours as needed for pain.   14 tablet   0    Triage Vitals: BP 84/46  Pulse 102  Temp(Src) 98 F (36.7 C) (Oral)  Resp 16  Ht 5\' 8"  (1.727 m)  Wt 165 lb (74.844 kg)  BMI 25.09 kg/m2  SpO2 93%  Physical Exam  Nursing note and vitals reviewed. Constitutional: He is oriented to person, place, and time. He appears well-developed and well-nourished. No distress.  HENT:  Head: Normocephalic and atraumatic.  Dry MM  Eyes: Conjunctivae and EOM are normal. Pupils are equal, round, and reactive to light.  Sclera are clear  Neck: Neck supple. No tracheal deviation present.  Cardiovascular: Normal rate and regular rhythm.   No murmur heard. Pulses:      Dorsalis pedis pulses are 2+ on the right side, and 2+ on the left side.  Pulmonary/Chest: Effort normal and breath sounds normal. No respiratory distress. He has no wheezes.  Abdominal: Soft. Bowel sounds are normal. He exhibits no distension. There is tenderness.  Tenderness at the umbilicus with a small bulge, no erythema   Musculoskeletal: Normal range of motion. He exhibits no edema (no ankle swelling).  Lymphadenopathy:    He has no cervical adenopathy.  Neurological: He is alert and oriented to person, place, and time. No cranial nerve deficit.  Pt able to move both sets of fingers and toes  Skin: Skin is warm and dry. No rash noted.  Psychiatric: He has a normal mood and affect. His behavior is normal.    ED Course  Procedures (including critical care time)  DIAGNOSTIC STUDIES: Oxygen Saturation is 94% on room air,  adequate by my interpretation.    COORDINATION OF CARE: 9:33 AM-Discussed treatment plan which includes CT of abdomen, CBC panel, BMP and UA with pt at bedside and pt agreed to plan.   Labs Reviewed  CBC WITH DIFFERENTIAL - Abnormal; Notable for the following:    WBC 10.9 (*)    Neutrophils Relative % 25 (*)    Lymphocytes Relative 58 (*)    Lymphs Abs 6.3 (*)    Monocytes Absolute 1.2 (*)  Basophils Relative 5 (*)    Basophils Absolute 0.5 (*)    All other components within normal limits  BASIC METABOLIC PANEL - Abnormal; Notable for the following:    Sodium 132 (*)    Glucose, Bld 116 (*)    GFR calc non Af Amer 88 (*)    All other components within normal limits  URINALYSIS, ROUTINE W REFLEX MICROSCOPIC - Abnormal; Notable for the following:    Hgb urine dipstick TRACE (*)    Bilirubin Urine SMALL (*)    Ketones, ur TRACE (*)    Protein, ur TRACE (*)    All other components within normal limits  GLUCOSE, CAPILLARY - Abnormal; Notable for the following:    Glucose-Capillary 107 (*)    All other components within normal limits  URINE MICROSCOPIC-ADD ON  PATHOLOGIST SMEAR REVIEW    Date: 03/01/2013  Rate: 100  Rhythm: normal sinus rhythm  QRS Axis: normal  Intervals: normal  ST/T Wave abnormalities: normal  Conduction Disutrbances:none  Narrative Interpretation:   Old EKG Reviewed: changes noted  Today's EKG with occasional premature ventricular traction. Occasional premature atrial contraction. Has left axis deviation. Other than the PVCs no significant change in EKG compared to 04/07/2009.  Results for orders placed during the hospital encounter of 03/01/13  CBC WITH DIFFERENTIAL      Result Value Range   WBC 10.9 (*) 4.0 - 10.5 K/uL   RBC 4.97  4.22 - 5.81 MIL/uL   Hemoglobin 15.7  13.0 - 17.0 g/dL   HCT 47.8  29.5 - 62.1 %   MCV 89.9  78.0 - 100.0 fL   MCH 31.6  26.0 - 34.0 pg   MCHC 35.1  30.0 - 36.0 g/dL   RDW 30.8  65.7 - 84.6 %   Platelets 177  150 -  400 K/uL   Neutrophils Relative % 25 (*) 43 - 77 %   Neutro Abs 2.8  1.7 - 7.7 K/uL   Lymphocytes Relative 58 (*) 12 - 46 %   Lymphs Abs 6.3 (*) 0.7 - 4.0 K/uL   Monocytes Relative 11  3 - 12 %   Monocytes Absolute 1.2 (*) 0.1 - 1.0 K/uL   Eosinophils Relative 1  0 - 5 %   Eosinophils Absolute 0.2  0.0 - 0.7 K/uL   Basophils Relative 5 (*) 0 - 1 %   Basophils Absolute 0.5 (*) 0.0 - 0.1 K/uL   WBC Morphology ATYPICAL LYMPHOCYTES    BASIC METABOLIC PANEL      Result Value Range   Sodium 132 (*) 135 - 145 mEq/L   Potassium 4.5  3.5 - 5.1 mEq/L   Chloride 97  96 - 112 mEq/L   CO2 27  19 - 32 mEq/L   Glucose, Bld 116 (*) 70 - 99 mg/dL   BUN 19  6 - 23 mg/dL   Creatinine, Ser 9.62  0.50 - 1.35 mg/dL   Calcium 9.3  8.4 - 95.2 mg/dL   GFR calc non Af Amer 88 (*) >90 mL/min   GFR calc Af Amer >90  >90 mL/min  URINALYSIS, ROUTINE W REFLEX MICROSCOPIC      Result Value Range   Color, Urine YELLOW  YELLOW   APPearance CLEAR  CLEAR   Specific Gravity, Urine 1.020  1.005 - 1.030   pH 6.0  5.0 - 8.0   Glucose, UA NEGATIVE  NEGATIVE mg/dL   Hgb urine dipstick TRACE (*) NEGATIVE   Bilirubin Urine SMALL (*) NEGATIVE  Ketones, ur TRACE (*) NEGATIVE mg/dL   Protein, ur TRACE (*) NEGATIVE mg/dL   Urobilinogen, UA 0.2  0.0 - 1.0 mg/dL   Nitrite NEGATIVE  NEGATIVE   Leukocytes, UA NEGATIVE  NEGATIVE  GLUCOSE, CAPILLARY      Result Value Range   Glucose-Capillary 107 (*) 70 - 99 mg/dL  URINE MICROSCOPIC-ADD ON      Result Value Range   RBC / HPF 0-2  <3 RBC/hpf     Ct Abdomen Pelvis W Contrast  03/01/2013   *RADIOLOGY REPORT*  Clinical Data: Right upper quadrant abdominal pain for 2 weeks, history of diabetes, hypertension, COPD, hemorrhoids, coronary artery disease post CABG, umbilical hernia, hypercholesterolemia  CT ABDOMEN AND PELVIS WITH CONTRAST  Technique:  Multidetector CT imaging of the abdomen and pelvis was performed following the standard protocol during bolus administration of  intravenous contrast. Sagittal and coronal MPR images reconstructed from axial data set.  Contrast: 50mL OMNIPAQUE IOHEXOL 300 MG/ML  SOLN, OMNIPAQUE IOHEXOL 300 MG/ML  SOLN  Comparison: None  Findings: Subsegmental atelectasis right lower lobe. Large amount of fat at inferior aspect of the left hemithorax appears to be due to diaphragmatic herniation of intra-abdominal fat, diaphragmatic defect approximately 2.2 cm diameter image 13. Small nodules within the fat likely represent lymph nodes, largest 9 mm short axis. Dependent density in gallbladder question tiny gallstones. No biliary dilatation.  Small cyst near dome of liver 11 x 8 mm image 12. Remainder of liver, spleen, pancreas, kidneys, and adrenal glands normal appearance. Tiny umbilical hernia containing fat, fascial defect 7 mm diameter. Diverticulosis of the descending and sigmoid colon without evidence of diverticulitis. Atherosclerotic calcifications aorta and coronary arteries with significant calcified plaque at proximal SMA and at celiac artery origin.  Stomach and remaining bowel loops normal appearance. Normal appendix. Mildly enlarged periportal lymph nodes 11-13 mm short axis images 19, 20, & 22. No mass, free fluid, inflammatory process or free air. No acute osseous findings.  IMPRESSION: Nonspecific minimally enlarged periportal lymph nodes. Question cholelithiasis without gross evidence of cholecystitis or biliary dilatation. Tiny umbilical hernia containing fat. 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 Sigmoid and descending colonic diverticulosis. Significant atherosclerotic disease.   Original Report Authenticated By: Ulyses Southward, M.D.    1. Abdominal pain     MDM  Patient workup without specific findings. Patient feeling much better currently once to be discharged home. Review of vital signs while in the emergency department showed one isolated blood pressure  below 90. Patient's had some other blood pressures in the 90s mostly upper 90s. Patient feels fine abdomen soft nontender no acute surgical abdomen. We'll discharge patient home he will return for any new or worse symptoms. He has referral to followup with his primary care Dr. Maryclare Labrador treat with pain medication.  I personally performed the services described in this documentation, which was scribed in my presence. The recorded information has been reviewed and is accurate.      Shelda Jakes, MD 03/01/13 [phone]

## 2014-06-28 ENCOUNTER — Emergency Department (HOSPITAL_COMMUNITY)
Admission: EM | Admit: 2014-06-28 | Discharge: 2014-06-28 | Disposition: A | Discharge status: Home / Self Care | Payer: Medicare Other | Source: Home / Self Care | Attending: Emergency Medicine | Admitting: Emergency Medicine

## 2014-06-28 ENCOUNTER — Emergency Department (HOSPITAL_COMMUNITY): Payer: Medicare Other

## 2014-06-28 ENCOUNTER — Encounter (HOSPITAL_COMMUNITY): Payer: Self-pay

## 2014-06-28 DIAGNOSIS — R1033 Periumbilical pain: Secondary | ICD-10-CM | POA: Insufficient documentation

## 2014-06-28 DIAGNOSIS — Z9889 Other specified postprocedural states: Secondary | ICD-10-CM

## 2014-06-28 DIAGNOSIS — R109 Unspecified abdominal pain: Secondary | ICD-10-CM

## 2014-06-28 DIAGNOSIS — J449 Chronic obstructive pulmonary disease, unspecified: Secondary | ICD-10-CM

## 2014-06-28 DIAGNOSIS — Z7982 Long term (current) use of aspirin: Secondary | ICD-10-CM

## 2014-06-28 DIAGNOSIS — I10 Essential (primary) hypertension: Secondary | ICD-10-CM

## 2014-06-28 DIAGNOSIS — Z8719 Personal history of other diseases of the digestive system: Secondary | ICD-10-CM | POA: Insufficient documentation

## 2014-06-28 DIAGNOSIS — Z8673 Personal history of transient ischemic attack (TIA), and cerebral infarction without residual deficits: Secondary | ICD-10-CM | POA: Insufficient documentation

## 2014-06-28 DIAGNOSIS — K449 Diaphragmatic hernia without obstruction or gangrene: Secondary | ICD-10-CM | POA: Diagnosis not present

## 2014-06-28 DIAGNOSIS — Z720 Tobacco use: Secondary | ICD-10-CM | POA: Insufficient documentation

## 2014-06-28 DIAGNOSIS — Z951 Presence of aortocoronary bypass graft: Secondary | ICD-10-CM

## 2014-06-28 DIAGNOSIS — Z79899 Other long term (current) drug therapy: Secondary | ICD-10-CM | POA: Insufficient documentation

## 2014-06-28 DIAGNOSIS — R1084 Generalized abdominal pain: Secondary | ICD-10-CM | POA: Diagnosis not present

## 2014-06-28 DIAGNOSIS — E119 Type 2 diabetes mellitus without complications: Secondary | ICD-10-CM

## 2014-06-28 DIAGNOSIS — E780 Pure hypercholesterolemia: Secondary | ICD-10-CM

## 2014-06-28 LAB — CBC WITH DIFFERENTIAL/PLATELET
BASOS ABS: 0.1 10*3/uL (ref 0.0–0.1)
Basophils Relative: 1 % (ref 0–1)
EOS ABS: 0.4 10*3/uL (ref 0.0–0.7)
EOS PCT: 3 % (ref 0–5)
HCT: 47.9 % (ref 39.0–52.0)
Hemoglobin: 16.9 g/dL (ref 13.0–17.0)
Lymphocytes Relative: 34 % (ref 12–46)
Lymphs Abs: 4.2 10*3/uL — ABNORMAL HIGH (ref 0.7–4.0)
MCH: 32.2 pg (ref 26.0–34.0)
MCHC: 35.3 g/dL (ref 30.0–36.0)
MCV: 91.2 fL (ref 78.0–100.0)
Monocytes Absolute: 0.9 10*3/uL (ref 0.1–1.0)
Monocytes Relative: 7 % (ref 3–12)
Neutro Abs: 7 10*3/uL (ref 1.7–7.7)
Neutrophils Relative %: 55 % (ref 43–77)
PLATELETS: 227 10*3/uL (ref 150–400)
RBC: 5.25 MIL/uL (ref 4.22–5.81)
RDW: 13.6 % (ref 11.5–15.5)
WBC: 12.6 10*3/uL — ABNORMAL HIGH (ref 4.0–10.5)

## 2014-06-28 LAB — COMPREHENSIVE METABOLIC PANEL
ALK PHOS: 60 U/L (ref 39–117)
ALT: 16 U/L (ref 0–53)
AST: 18 U/L (ref 0–37)
Albumin: 4.7 g/dL (ref 3.5–5.2)
Anion gap: 14 (ref 5–15)
BILIRUBIN TOTAL: 0.6 mg/dL (ref 0.3–1.2)
BUN: 21 mg/dL (ref 6–23)
CHLORIDE: 100 meq/L (ref 96–112)
CO2: 26 meq/L (ref 19–32)
CREATININE: 0.87 mg/dL (ref 0.50–1.35)
Calcium: 10 mg/dL (ref 8.4–10.5)
GFR calc Af Amer: >90 mL/min (ref >90)
Glucose, Bld: 130 mg/dL — ABNORMAL HIGH (ref 70–99)
Potassium: 4.1 mEq/L (ref 3.7–5.3)
SODIUM: 140 meq/L (ref 137–147)
Total Protein: 7.9 g/dL (ref 6.0–8.3)

## 2014-06-28 LAB — URINE MICROSCOPIC-ADD ON

## 2014-06-28 LAB — TROPONIN I: Troponin I: <0.3 ng/mL (ref <0.30)

## 2014-06-28 LAB — URINALYSIS, ROUTINE W REFLEX MICROSCOPIC
Bilirubin Urine: NEGATIVE
GLUCOSE, UA: NEGATIVE mg/dL
LEUKOCYTES UA: NEGATIVE
Nitrite: NEGATIVE
PH: 6 (ref 5.0–8.0)
Protein, ur: NEGATIVE mg/dL
Specific Gravity, Urine: 1.015 (ref 1.005–1.030)
Urobilinogen, UA: 0.2 mg/dL (ref 0.0–1.0)

## 2014-06-28 LAB — LIPASE, BLOOD: LIPASE: 73 U/L — AB (ref 11–59)

## 2014-06-28 MED ORDER — PROMETHAZINE HCL 25 MG PO TABS: 25.0000 mg | ORAL_TABLET | Freq: Four times a day (QID) | ORAL | Status: DC | PRN

## 2014-06-28 MED ORDER — ONDANSETRON HCL 4 MG/2ML IJ SOLN
4.0000 mg | Freq: Once | INTRAMUSCULAR | Status: AC
  Administered 2014-06-28: 4 mg via INTRAMUSCULAR
  Filled 2014-06-28: qty 2

## 2014-06-28 MED ORDER — ONDANSETRON HCL 4 MG/2ML IJ SOLN
4.0000 mg | Freq: Once | INTRAMUSCULAR | Status: AC
  Administered 2014-06-28: 4 mg via INTRAVENOUS
  Filled 2014-06-28: qty 2

## 2014-06-28 MED ORDER — MORPHINE SULFATE 4 MG/ML IJ SOLN
4.0000 mg | Freq: Once | INTRAMUSCULAR | Status: AC
  Administered 2014-06-28: 4 mg via INTRAVENOUS
  Filled 2014-06-28: qty 1

## 2014-06-28 MED ORDER — IOHEXOL 300 MG/ML  SOLN
100.0000 mL | Freq: Once | INTRAMUSCULAR | Status: AC | PRN
  Administered 2014-06-28: 100 mL via INTRAVENOUS

## 2014-06-28 MED ORDER — SODIUM CHLORIDE 0.9 % IV SOLN
Freq: Once | INTRAVENOUS | Status: AC
  Administered 2014-06-28: 01:00:00 via INTRAVENOUS

## 2014-06-28 MED ORDER — OXYCODONE-ACETAMINOPHEN 5-325 MG PO TABS: 2.0000 | ORAL_TABLET | ORAL | Status: DC | PRN

## 2014-06-28 MED ORDER — IOHEXOL 300 MG/ML  SOLN
50.0000 mL | Freq: Once | INTRAMUSCULAR | Status: AC | PRN
  Administered 2014-06-28: 50 mL via ORAL

## 2014-06-28 NOTE — Discharge Instructions (Signed)
Prescriptions for pain and nausea medicine. Follow-up with Dr. Malvin JohnsBradford on Wednesday.

## 2014-06-28 NOTE — ED Notes (Signed)
I have a hernia. I am supposed to see the doctor on Wednesday. Around 3 hours ago I started having real bad shooting pain.

## 2014-06-29 ENCOUNTER — Emergency Department (HOSPITAL_COMMUNITY): Payer: Medicare Other

## 2014-06-29 ENCOUNTER — Encounter (HOSPITAL_COMMUNITY): Payer: Self-pay | Admitting: *Deleted

## 2014-06-29 ENCOUNTER — Inpatient Hospital Stay (HOSPITAL_COMMUNITY)
Admission: EM | Admit: 2014-06-29 | Discharge: 2014-07-07 | DRG: 327 | Disposition: A | Discharge status: Home Health | Payer: Medicare Other | Attending: Thoracic Surgery (Cardiothoracic Vascular Surgery) | Admitting: Thoracic Surgery (Cardiothoracic Vascular Surgery)

## 2014-06-29 DIAGNOSIS — R1084 Generalized abdominal pain: Secondary | ICD-10-CM | POA: Diagnosis present

## 2014-06-29 DIAGNOSIS — K913 Postprocedural intestinal obstruction: Secondary | ICD-10-CM | POA: Diagnosis not present

## 2014-06-29 DIAGNOSIS — I10 Essential (primary) hypertension: Secondary | ICD-10-CM | POA: Diagnosis present

## 2014-06-29 DIAGNOSIS — E780 Pure hypercholesterolemia: Secondary | ICD-10-CM | POA: Diagnosis present

## 2014-06-29 DIAGNOSIS — Z7982 Long term (current) use of aspirin: Secondary | ICD-10-CM

## 2014-06-29 DIAGNOSIS — K429 Umbilical hernia without obstruction or gangrene: Secondary | ICD-10-CM | POA: Diagnosis present

## 2014-06-29 DIAGNOSIS — K567 Ileus, unspecified: Secondary | ICD-10-CM

## 2014-06-29 DIAGNOSIS — I2510 Atherosclerotic heart disease of native coronary artery without angina pectoris: Secondary | ICD-10-CM | POA: Diagnosis present

## 2014-06-29 DIAGNOSIS — F17210 Nicotine dependence, cigarettes, uncomplicated: Secondary | ICD-10-CM | POA: Diagnosis present

## 2014-06-29 DIAGNOSIS — J9811 Atelectasis: Secondary | ICD-10-CM | POA: Diagnosis not present

## 2014-06-29 DIAGNOSIS — N179 Acute kidney failure, unspecified: Secondary | ICD-10-CM | POA: Diagnosis not present

## 2014-06-29 DIAGNOSIS — H5462 Unqualified visual loss, left eye, normal vision right eye: Secondary | ICD-10-CM | POA: Diagnosis present

## 2014-06-29 DIAGNOSIS — R109 Unspecified abdominal pain: Secondary | ICD-10-CM

## 2014-06-29 DIAGNOSIS — I69898 Other sequelae of other cerebrovascular disease: Secondary | ICD-10-CM | POA: Diagnosis not present

## 2014-06-29 DIAGNOSIS — T17590A Other foreign object in bronchus causing asphyxiation, initial encounter: Secondary | ICD-10-CM | POA: Diagnosis not present

## 2014-06-29 DIAGNOSIS — D72829 Elevated white blood cell count, unspecified: Secondary | ICD-10-CM | POA: Diagnosis not present

## 2014-06-29 DIAGNOSIS — J449 Chronic obstructive pulmonary disease, unspecified: Secondary | ICD-10-CM | POA: Diagnosis present

## 2014-06-29 DIAGNOSIS — K449 Diaphragmatic hernia without obstruction or gangrene: Principal | ICD-10-CM | POA: Diagnosis present

## 2014-06-29 DIAGNOSIS — Z951 Presence of aortocoronary bypass graft: Secondary | ICD-10-CM | POA: Diagnosis not present

## 2014-06-29 DIAGNOSIS — K560 Paralytic ileus: Secondary | ICD-10-CM

## 2014-06-29 DIAGNOSIS — E119 Type 2 diabetes mellitus without complications: Secondary | ICD-10-CM | POA: Diagnosis present

## 2014-06-29 DIAGNOSIS — R079 Chest pain, unspecified: Secondary | ICD-10-CM

## 2014-06-29 HISTORY — DX: Diaphragmatic hernia without obstruction or gangrene: K44.9

## 2014-06-29 LAB — CBC WITH DIFFERENTIAL/PLATELET
Basophils Absolute: 0 10*3/uL (ref 0.0–0.1)
Basophils Relative: 0 % (ref 0–1)
EOS PCT: 1 % (ref 0–5)
Eosinophils Absolute: 0.2 10*3/uL (ref 0.0–0.7)
HCT: 49.8 % (ref 39.0–52.0)
HEMOGLOBIN: 17.5 g/dL — AB (ref 13.0–17.0)
LYMPHS ABS: 3.5 10*3/uL (ref 0.7–4.0)
LYMPHS PCT: 26 % (ref 12–46)
MCH: 32.3 pg (ref 26.0–34.0)
MCHC: 35.1 g/dL (ref 30.0–36.0)
MCV: 92.1 fL (ref 78.0–100.0)
MONOS PCT: 13 % — AB (ref 3–12)
Monocytes Absolute: 1.8 10*3/uL — ABNORMAL HIGH (ref 0.1–1.0)
NEUTROS ABS: 8.3 10*3/uL — AB (ref 1.7–7.7)
Neutrophils Relative %: 60 % (ref 43–77)
Platelets: 219 10*3/uL (ref 150–400)
RBC: 5.41 MIL/uL (ref 4.22–5.81)
RDW: 13.7 % (ref 11.5–15.5)
WBC: 13.7 10*3/uL — AB (ref 4.0–10.5)

## 2014-06-29 LAB — URINALYSIS, ROUTINE W REFLEX MICROSCOPIC
BILIRUBIN URINE: NEGATIVE
GLUCOSE, UA: NEGATIVE mg/dL
Ketones, ur: 40 mg/dL — AB
Leukocytes, UA: NEGATIVE
Nitrite: NEGATIVE
PH: 6 (ref 5.0–8.0)
Protein, ur: NEGATIVE mg/dL
SPECIFIC GRAVITY, URINE: 1.008 (ref 1.005–1.030)
UROBILINOGEN UA: 0.2 mg/dL (ref 0.0–1.0)

## 2014-06-29 LAB — COMPREHENSIVE METABOLIC PANEL
ALT: 15 U/L (ref 0–53)
AST: 15 U/L (ref 0–37)
Albumin: 4.2 g/dL (ref 3.5–5.2)
Alkaline Phosphatase: 56 U/L (ref 39–117)
Anion gap: 12 (ref 5–15)
BUN: 12 mg/dL (ref 6–23)
CALCIUM: 9.7 mg/dL (ref 8.4–10.5)
CHLORIDE: 96 meq/L (ref 96–112)
CO2: 29 meq/L (ref 19–32)
CREATININE: 0.75 mg/dL (ref 0.50–1.35)
GFR calc Af Amer: >90 mL/min (ref >90)
GFR calc non Af Amer: >90 mL/min (ref >90)
Glucose, Bld: 121 mg/dL — ABNORMAL HIGH (ref 70–99)
Potassium: 3.9 mEq/L (ref 3.7–5.3)
SODIUM: 137 meq/L (ref 137–147)
Total Bilirubin: 1.5 mg/dL — ABNORMAL HIGH (ref 0.3–1.2)
Total Protein: 7.3 g/dL (ref 6.0–8.3)

## 2014-06-29 LAB — LIPASE, BLOOD: LIPASE: 18 U/L (ref 11–59)

## 2014-06-29 LAB — URINE MICROSCOPIC-ADD ON

## 2014-06-29 LAB — I-STAT CG4 LACTIC ACID, ED: Lactic Acid, Venous: 1.24 mmol/L (ref 0.5–2.2)

## 2014-06-29 MED ORDER — OXYCODONE HCL 5 MG PO TABS
5.0000 mg | ORAL_TABLET | ORAL | Status: DC | PRN
  Administered 2014-06-30 – 2014-07-01 (×5): 5 mg via ORAL
  Filled 2014-06-29 (×5): qty 1

## 2014-06-29 MED ORDER — ONDANSETRON HCL 4 MG PO TABS: 4.0000 mg | ORAL_TABLET | Freq: Once | ORAL | Status: DC

## 2014-06-29 MED ORDER — ENOXAPARIN SODIUM 30 MG/0.3ML ~~LOC~~ SOLN
30.0000 mg | SUBCUTANEOUS | Status: DC
Start: 2014-06-30 — End: 2014-07-01
  Administered 2014-06-30: 30 mg via SUBCUTANEOUS
  Filled 2014-06-29 (×2): qty 0.3

## 2014-06-29 MED ORDER — ONDANSETRON HCL 4 MG PO TABS: 4.0000 mg | ORAL_TABLET | Freq: Four times a day (QID) | ORAL | Status: DC | PRN

## 2014-06-29 MED ORDER — OXYCODONE-ACETAMINOPHEN 5-325 MG PO TABS
1.0000 | ORAL_TABLET | Freq: Once | ORAL | Status: AC
  Administered 2014-06-29: 1 via ORAL
  Filled 2014-06-29: qty 1

## 2014-06-29 MED ORDER — NICOTINE 14 MG/24HR TD PT24
14.0000 mg | MEDICATED_PATCH | Freq: Every day | TRANSDERMAL | Status: DC
  Administered 2014-06-30: 14 mg via TRANSDERMAL
  Filled 2014-06-29 (×2): qty 1

## 2014-06-29 MED ORDER — CARVEDILOL 12.5 MG PO TABS
12.5000 mg | ORAL_TABLET | Freq: Two times a day (BID) | ORAL | Status: DC
  Administered 2014-06-30 – 2014-07-04 (×10): 12.5 mg via ORAL
  Filled 2014-06-29 (×13): qty 1

## 2014-06-29 MED ORDER — LEVALBUTEROL HCL 0.63 MG/3ML IN NEBU
0.6300 mg | INHALATION_SOLUTION | Freq: Four times a day (QID) | RESPIRATORY_TRACT | Status: DC | PRN
  Administered 2014-06-30 (×2): 0.63 mg via RESPIRATORY_TRACT
  Filled 2014-06-29 (×2): qty 3

## 2014-06-29 MED ORDER — BUDESONIDE-FORMOTEROL FUMARATE 160-4.5 MCG/ACT IN AERO
2.0000 | INHALATION_SPRAY | Freq: Two times a day (BID) | RESPIRATORY_TRACT | Status: DC
  Administered 2014-06-30 – 2014-07-06 (×12): 2 via RESPIRATORY_TRACT
  Filled 2014-06-29 (×2): qty 6

## 2014-06-29 MED ORDER — KCL IN DEXTROSE-NACL 10-5-0.45 MEQ/L-%-% IV SOLN
INTRAVENOUS | Status: DC
  Administered 2014-06-30: 100 mL/h via INTRAVENOUS
  Administered 2014-07-01: 02:00:00 via INTRAVENOUS
  Filled 2014-06-29 (×6): qty 1000

## 2014-06-29 MED ORDER — INSULIN ASPART 100 UNIT/ML ~~LOC~~ SOLN: 0.0000 [IU] | Freq: Every day | SUBCUTANEOUS | Status: DC

## 2014-06-29 MED ORDER — OXYCODONE-ACETAMINOPHEN 5-325 MG PO TABS: 1.0000 | ORAL_TABLET | Freq: Once | ORAL | Status: DC

## 2014-06-29 MED ORDER — ASPIRIN EC 81 MG PO TBEC
81.0000 mg | DELAYED_RELEASE_TABLET | Freq: Every day | ORAL | Status: DC
  Administered 2014-06-30: 81 mg via ORAL
  Filled 2014-06-29 (×2): qty 1

## 2014-06-29 MED ORDER — SODIUM CHLORIDE 0.9 % IV BOLUS (SEPSIS)
1000.0000 mL | Freq: Once | INTRAVENOUS | Status: AC
  Administered 2014-06-29: 1000 mL via INTRAVENOUS

## 2014-06-29 MED ORDER — IOHEXOL 350 MG/ML SOLN
100.0000 mL | Freq: Once | INTRAVENOUS | Status: AC | PRN
  Administered 2014-06-29: 100 mL via INTRAVENOUS

## 2014-06-29 MED ORDER — ONDANSETRON HCL 4 MG/2ML IJ SOLN: 4.0000 mg | Freq: Four times a day (QID) | INTRAMUSCULAR | Status: DC | PRN

## 2014-06-29 MED ORDER — FENTANYL CITRATE 0.05 MG/ML IJ SOLN
25.0000 ug | INTRAMUSCULAR | Status: DC | PRN
  Administered 2014-06-30 – 2014-07-01 (×3): 25 ug via INTRAVENOUS
  Filled 2014-06-29 (×3): qty 2

## 2014-06-29 MED ORDER — PIPERACILLIN-TAZOBACTAM 3.375 G IVPB
3.3750 g | Freq: Three times a day (TID) | INTRAVENOUS | Status: DC
  Administered 2014-06-30 – 2014-07-01 (×5): 3.375 g via INTRAVENOUS
  Filled 2014-06-29 (×8): qty 50

## 2014-06-29 MED ORDER — INSULIN ASPART 100 UNIT/ML ~~LOC~~ SOLN
0.0000 [IU] | Freq: Three times a day (TID) | SUBCUTANEOUS | Status: DC
Start: 2014-06-30 — End: 2014-07-01
  Administered 2014-06-30: 2 [IU] via SUBCUTANEOUS
  Administered 2014-07-01: 1 [IU] via SUBCUTANEOUS

## 2014-06-29 NOTE — ED Notes (Signed)
Surgeon at the bedside.
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
## 2014-06-29 NOTE — ED Provider Notes (Signed)
CSN: [id number]     Arrival date & time 06/29/14  1543 History   First MD Initiated Contact with Patient 06/29/14 1552     Chief Complaint  Patient presents with  . Abdominal Pain  . Hernia     (Consider location/radiation/quality/duration/timing/severity/associated sxs/prior Treatment) Patient is a 63 y.o. male presenting with abdominal pain.  Abdominal Pain Pain location:  Generalized Pain quality: cramping   Pain radiates to:  Does not radiate Pain severity:  Moderate Onset quality:  Gradual Duration:  2 days Timing:  Constant Progression:  Worsening Chronicity:  Recurrent Context comment:  Known diaphragmatic/periumbilical/bil inguinal hernias Relieved by:  Nothing Exacerbated by: standing. Associated symptoms: anorexia, chills and nausea   Associated symptoms: no constipation, no diarrhea, no fever and no vomiting     Past Medical History  Diagnosis Date  . Abdominal hernia   . Diabetes mellitus without complication   . COPD (chronic obstructive pulmonary disease)   . Hypertension   . High cholesterol   . Stroke   . Arrhythmia   . Pneumothorax    Past Surgical History  Procedure Laterality Date  . Coronary artery bypass graft      x3  . Hemorroidectomy     History reviewed. No pertinent family history. History  Substance Use Topics  . Smoking status: Current Some Day Smoker -- 48 years    Types: Cigarettes  . Smokeless tobacco: Never Used  . Alcohol Use: No    Review of Systems  Constitutional: Positive for chills. Negative for fever.  Gastrointestinal: Positive for nausea, abdominal pain and anorexia. Negative for vomiting, diarrhea and constipation.  All other systems reviewed and are negative.     Allergies  Review of patient's allergies indicates no known allergies.  Home Medications   Prior to Admission medications   Medication Sig Start Date End Date Taking? Authorizing Provider  albuterol (PROVENTIL) (2.5 MG/3ML) 0.083% nebulizer  solution Take 2.5 mg by nebulization every 4 (four) hours as needed for wheezing.   Yes Historical Provider, MD  aspirin 325 MG EC tablet Take 325 mg by mouth at bedtime.    Yes Historical Provider, MD  carvedilol (COREG) 12.5 MG tablet Take 18.25 mg by mouth 2 (two) times daily with a meal.    Yes Historical Provider, MD  ipratropium (ATROVENT) 0.02 % nebulizer solution Take 500 mcg by nebulization every 4 (four) hours as needed for wheezing.   Yes Historical Provider, MD  lisinopril (PRINIVIL,ZESTRIL) 10 MG tablet Take 10 mg by mouth daily.   Yes Historical Provider, MD  metFORMIN (GLUCOPHAGE) 500 MG tablet Take 500 mg by mouth 2 (two) times daily with a meal.   Yes Historical Provider, MD  oxyCODONE-acetaminophen (PERCOCET) 5-325 MG per tablet Take 2 tablets by mouth every 4 (four) hours as needed. Patient taking differently: Take 1-2 tablets by mouth every 4 (four) hours as needed for moderate pain.  06/28/14  Yes Donnetta HutchingBrian Cook, MD  promethazine (PHENERGAN) 25 MG tablet Take 1 tablet (25 mg total) by mouth every 6 (six) hours as needed. 06/28/14  Yes Donnetta HutchingBrian Cook, MD  simvastatin (ZOCOR) 40 MG tablet Take 40 mg by mouth every evening.   Yes Historical Provider, MD  albuterol (PROVENTIL HFA;VENTOLIN HFA) 108 (90 BASE) MCG/ACT inhaler Inhale 2 puffs into the lungs every 6 (six) hours as needed for wheezing or shortness of breath.    Historical Provider, MD   BP 138/85 mmHg  Pulse 113  Temp(Src) 97.6 F (36.4 C) (Oral)  Resp 24  SpO2 91% Physical Exam  Constitutional: He is oriented to person, place, and time. He appears well-developed and well-nourished.  HENT:  Head: Normocephalic and atraumatic.  Eyes: Conjunctivae and EOM are normal.  Neck: Normal range of motion. Neck supple.  Cardiovascular: Normal rate, regular rhythm and normal heart sounds.   Pulmonary/Chest: Effort normal and breath sounds normal. No respiratory distress.  Abdominal: He exhibits no distension. There is no tenderness.  There is no rebound and no guarding.  Musculoskeletal: Normal range of motion.  Neurological: He is alert and oriented to person, place, and time.  Skin: Skin is warm and dry.  Vitals reviewed.   ED Course  Procedures (including critical care time) Labs Review Labs Reviewed  COMPREHENSIVE METABOLIC PANEL - Abnormal; Notable for the following:    Glucose, Bld 121 (*)    Total Bilirubin 1.5 (*)    All other components within normal limits  CBC WITH DIFFERENTIAL - Abnormal; Notable for the following:    WBC 13.7 (*)    Hemoglobin 17.5 (*)    Neutro Abs 8.3 (*)    Monocytes Relative 13 (*)    Monocytes Absolute 1.8 (*)    All other components within normal limits  URINALYSIS, ROUTINE W REFLEX MICROSCOPIC - Abnormal; Notable for the following:    APPearance CLOUDY (*)    Hgb urine dipstick SMALL (*)    Ketones, ur 40 (*)    All other components within normal limits  LIPASE, BLOOD  URINE MICROSCOPIC-ADD ON  LACTIC ACID, PLASMA  CBC  CREATININE, SERUM  BLOOD GAS, ARTERIAL  CBC  COMPREHENSIVE METABOLIC PANEL  PROTIME-INR  APTT  I-STAT CG4 LACTIC ACID, ED    Imaging Review Dg Chest 2 View  06/28/2014   CLINICAL DATA:  Lower abdominal pain.  History of COPD.  EXAM: CHEST  2 VIEW  COMPARISON:  CT abdomen and pelvis 03/01/2013. PA and lateral chest 03/01/2013.  FINDINGS: The chest is hyperexpanded but the lungs are clear. Chronic blunting of the left costophrenic angle compatible with diaphragmatic hernia as seen on prior CT is unchanged. Heart size is normal. The patient is status post CABG. No focal bony abnormality is identified.  IMPRESSION: No acute disease.  Stable compared to prior exam.   Electronically Signed   By: Drusilla Kanner M.D.   On: 06/28/2014 03:07   Ct Angio Abdomen W/cm &/or Wo Contrast  06/29/2014   CLINICAL DATA:  Abdominal pain.  Chest pain.  EXAM: CT ANGIOGRAPHY CHEST AND ABDOMEN  TECHNIQUE: Multidetector CT imaging of the chest and abdomen was performed  using the standard protocol during bolus administration of intravenous contrast. Multiplanar CT image reconstructions and MIPs were obtained to evaluate the vascular anatomy.  CONTRAST:  OMNIPAQUE IOHEXOL 350 MG/ML SOLN  COMPARISON:  None.  FINDINGS: CTA CHEST FINDINGS  THORACIC INLET/BODY WALL:  No acute abnormality.  MEDIASTINUM:  Normal heart size. No pericardial effusion. There is diffuse coronary atherosclerosis, status post CABG. Due to motion, cannot confidently evaluate the patency of the saphenous vein and LIMA graft. There is no evidence of intramural hematoma within the aorta. No aortic dissection or aneurysm. 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  LUNG WINDOWS:  There is subtle ground-glass centrilobular nodularity throughout the lungs. This is often seen with arterial times imaging, but could also be related to a respiratory bronchiolitis (as seen with smoking). No consolidation, edema, effusion, or pneumothorax.  Patchy filling defects within the right mainstem bronchus have an appearance most suggestive of mucus.  Narrow necked left diaphragmatic hernia containing fat and splenic flexure. No evidence for obstruction.  OSSEOUS:  No acute fracture.  No suspicious lytic or blastic lesions.  Review of the MIP images confirms the above findings.  CTA ABDOMEN FINDINGS  BODY WALL: Unremarkable.  Liver: Few small low-density foci within liver are too small to characterize, but likely reflects cysts. Transient, based on previous imaging, perfusion anomaly in the right liver.  Biliary: Layering gallstones.  Pancreas: Unremarkable.  Spleen: Unremarkable.  Adrenals: Unremarkable.  Kidneys and ureters: No hydronephrosis or stone.  Bladder: Unremarkable.  Reproductive: Unremarkable.  Bowel: Splenic flexure herniation through the left diaphragm, as above. There is no related bowel obstruction.  When accounting for under distended segments, no colonic wall thickening. Distal colonic diverticulosis. Normal appendix.  Retroperitoneum: No mass or adenopathy.  Peritoneum: No ascites or pneumoperitoneum.  Vascular: Standard aortic branching pattern. There is diffuse atheromatous changes with advanced narrowing of the proximal SMA. Celiac axis is patent and there is no significant collateral formation. Extensive plaque deposition within the bilateral iliac arteries with moderate diffuse common iliac artery stenosis. Corrugated appearance of the renal arteries on axial imaging is not confirmed on reformatted imaging to suggest fibromuscular dysplasia.  OSSEOUS: Multiple remote appearing superior endplate concavities in the thoracic spine.  Review of the MIP images confirms the above findings.  IMPRESSION: 1. No aortic hematoma or dissection. 2. 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 3. Narrow necked left diaphragm hernia containing omental fat and the splenic flexure. The colonic herniation is new from 2014. 4. Cholelithiasis. 5. Colonic diverticulosis.   Electronically Signed   By: Tiburcio Pea M.D.   On: 06/29/2014 21:11   Ct Abdomen Pelvis W Contrast  06/28/2014   CLINICAL DATA:  Shooting abdominal pain beginning approximately 3 hours prior to the CT scan. Initial encounter.  EXAM: CT ABDOMEN AND PELVIS WITH CONTRAST  TECHNIQUE: Multidetector CT imaging of the abdomen and pelvis was performed using the standard protocol following bolus administration of intravenous contrast.  CONTRAST:  50 mL OMNIPAQUE IOHEXOL 300 MG/ML SOLN, 100 mL OMNIPAQUE IOHEXOL 300 MG/ML SOLN  COMPARISON:  CT abdomen and pelvis 03/01/2013.  FINDINGS: Diaphragmatic hernia on the left containing and the splenic flexure of the colon is identified. On the prior examination, the hernia did not contain bowel but there is no bowel obstruction or other complicating  feature. Small hiatal hernia is identified. There is no pleural or pericardial effusion.  Small stones are seen in the gallbladder without evidence of cholecystitis. The adrenal glands, spleen, pancreas and kidneys appear normal. Two small hypo attenuating liver lesions are most consistent with cysts.  The small bowel appears normal. Scattered aortoiliac atherosclerosis without aneurysm is identified. Tiny fat containing umbilical hernia is unchanged. No lymphadenopathy or fluid collection. No focal bony abnormality is identified.  IMPRESSION: No acute finding abdomen or pelvis.  Diaphragmatic hernia on the left contains the splenic flexure of the colon on today's study without obstruction other complicating feature.  Gallstones without evidence of cholecystitis.  Atherosclerosis.   Electronically Signed   By: Drusilla Kanner M.D.   On: 06/28/2014 03:22   Ct Angio Chest Aorta W/cm &/or Wo/cm  06/29/2014   CLINICAL DATA:  Abdominal pain.  Chest pain.  EXAM: CT ANGIOGRAPHY CHEST AND ABDOMEN  TECHNIQUE: Multidetector CT imaging of the chest and abdomen was  performed using the standard protocol during bolus administration of intravenous contrast. Multiplanar CT image reconstructions and MIPs were obtained to evaluate the vascular anatomy.  CONTRAST:  OMNIPAQUE IOHEXOL 350 MG/ML SOLN  COMPARISON:  None.  FINDINGS: CTA CHEST FINDINGS  THORACIC INLET/BODY WALL:  No acute abnormality.  MEDIASTINUM:  Normal heart size. No pericardial effusion. There is diffuse coronary atherosclerosis, status post CABG. Due to motion, cannot confidently evaluate the patency of the saphenous vein and LIMA graft. There is no evidence of intramural hematoma within the aorta. No aortic dissection or aneurysm. 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  LUNG WINDOWS:  There is subtle ground-glass centrilobular nodularity  throughout the lungs. This is often seen with arterial times imaging, but could also be related to a respiratory bronchiolitis (as seen with smoking). No consolidation, edema, effusion, or pneumothorax.  Patchy filling defects within the right mainstem bronchus have an appearance most suggestive of mucus.  Narrow necked left diaphragmatic hernia containing fat and splenic flexure. No evidence for obstruction.  OSSEOUS:  No acute fracture.  No suspicious lytic or blastic lesions.  Review of the MIP images confirms the above findings.  CTA ABDOMEN FINDINGS  BODY WALL: Unremarkable.  Liver: Few small low-density foci within liver are too small to characterize, but likely reflects cysts. Transient, based on previous imaging, perfusion anomaly in the right liver.  Biliary: Layering gallstones.  Pancreas: Unremarkable.  Spleen: Unremarkable.  Adrenals: Unremarkable.  Kidneys and ureters: No hydronephrosis or stone.  Bladder: Unremarkable.  Reproductive: Unremarkable.  Bowel: Splenic flexure herniation through the left diaphragm, as above. There is no related bowel obstruction. When accounting for under distended segments, no colonic wall thickening. Distal colonic diverticulosis. Normal appendix.  Retroperitoneum: No mass or adenopathy.  Peritoneum: No ascites or pneumoperitoneum.  Vascular: Standard aortic branching pattern. There is diffuse atheromatous changes with advanced narrowing of the proximal SMA. Celiac axis is patent and there is no significant collateral formation. Extensive plaque deposition within the bilateral iliac arteries with moderate diffuse common iliac artery stenosis. Corrugated appearance of the renal arteries on axial imaging is not confirmed on reformatted imaging to suggest fibromuscular dysplasia.  OSSEOUS: Multiple remote appearing superior endplate concavities in the thoracic spine.  Review of the MIP images confirms the above findings.  IMPRESSION: 1. No aortic hematoma or dissection. 2.  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 3. Narrow necked left diaphragm hernia containing omental fat and the splenic flexure. The colonic herniation is new from 2014. 4. Cholelithiasis. 5. Colonic diverticulosis.   Electronically Signed   By: Tiburcio Pea M.D.   On: 06/29/2014 21:11     EKG Interpretation None      MDM   Final diagnoses:  Abdominal pain, acute  Chest pain   63 y.o. male with pertinent PMH of known diaphragmatic hernia, COPD, DM, CAD presents with acute on chronic abd pain.  Pt was seen by Dr. Malvin Johns in [place] earlier today for same and told to present to the ED.  I spoke with Dr. 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 Pt had a CT scan yesterday which showed continued herniation.  CTA chest/abd/pelvis obtained and without acute dissection or other emergent pathology, however demonstrated atherosclerosis of SMA,  as well as sigmoid colon through hernia.  Consulted thoracic surgery and pt admitted.    1. Abdominal pain, acute   2. Chest pain   3. Diaphragmatic hernia without obstruction         Mirian MoMatthew Gentry, MD 06/30/14 0004

## 2014-06-29 NOTE — ED Notes (Signed)
Patient returned from CT

## 2014-06-29 NOTE — ED Notes (Signed)
Pt reports having multiple hernias and abd pain. Pt was seen at [facility] yesterday for same. Went to surgeons office today and was told unable to do surgery at this time, came here due to still having pain despite meds at home.

## 2014-06-30 ENCOUNTER — Inpatient Hospital Stay (HOSPITAL_COMMUNITY): Payer: Medicare Other

## 2014-06-30 DIAGNOSIS — I2510 Atherosclerotic heart disease of native coronary artery without angina pectoris: Secondary | ICD-10-CM

## 2014-06-30 LAB — GLUCOSE, CAPILLARY
Glucose-Capillary: 120 mg/dL — ABNORMAL HIGH (ref 70–99)
Glucose-Capillary: 155 mg/dL — ABNORMAL HIGH (ref 70–99)
Glucose-Capillary: 99 mg/dL (ref 70–99)
Glucose-Capillary: 128 mg/dL — ABNORMAL HIGH (ref 70–99)

## 2014-06-30 LAB — COMPREHENSIVE METABOLIC PANEL
ALT: 15 U/L (ref 0–53)
AST: 14 U/L (ref 0–37)
Albumin: 3.6 g/dL (ref 3.5–5.2)
Alkaline Phosphatase: 49 U/L (ref 39–117)
Anion gap: 16 — ABNORMAL HIGH (ref 5–15)
BUN: 12 mg/dL (ref 6–23)
CO2: 24 mEq/L (ref 19–32)
Calcium: 8.9 mg/dL (ref 8.4–10.5)
Chloride: 94 mEq/L — ABNORMAL LOW (ref 96–112)
Creatinine, Ser: 0.68 mg/dL (ref 0.50–1.35)
GFR calc Af Amer: >90 mL/min (ref >90)
GFR calc non Af Amer: >90 mL/min (ref >90)
Glucose, Bld: 114 mg/dL — ABNORMAL HIGH (ref 70–99)
Potassium: 3.5 mEq/L — ABNORMAL LOW (ref 3.7–5.3)
Sodium: 134 mEq/L — ABNORMAL LOW (ref 137–147)
Total Bilirubin: 2.2 mg/dL — ABNORMAL HIGH (ref 0.3–1.2)
Total Protein: 6.6 g/dL (ref 6.0–8.3)

## 2014-06-30 LAB — CBC
HCT: 46.3 % (ref 39.0–52.0)
Hemoglobin: 16.3 g/dL (ref 13.0–17.0)
MCH: 31.5 pg (ref 26.0–34.0)
MCHC: 35.2 g/dL (ref 30.0–36.0)
MCV: 89.6 fL (ref 78.0–100.0)
Platelets: 191 10*3/uL (ref 150–400)
RBC: 5.17 MIL/uL (ref 4.22–5.81)
RDW: 13.6 % (ref 11.5–15.5)
WBC: 14.9 10*3/uL — ABNORMAL HIGH (ref 4.0–10.5)

## 2014-06-30 LAB — BLOOD GAS, ARTERIAL
Acid-Base Excess: 0.9 mmol/L (ref 0.0–2.0)
Bicarbonate: 24.6 mEq/L — ABNORMAL HIGH (ref 20.0–24.0)
Drawn by: 418751
FIO2: 0.21 %
O2 Saturation: 93.7 %
Patient temperature: 98.6
TCO2: 25.7 mmol/L (ref 0–100)
pCO2 arterial: 36.6 mmHg (ref 35.0–45.0)
pH, Arterial: 7.443 (ref 7.350–7.450)
pO2, Arterial: 65.2 mmHg — ABNORMAL LOW (ref 80.0–100.0)

## 2014-06-30 LAB — PROTIME-INR
INR: 1.2 (ref 0.00–1.49)
Prothrombin Time: 15.4 seconds — ABNORMAL HIGH (ref 11.6–15.2)

## 2014-06-30 LAB — SURGICAL PCR SCREEN
MRSA, PCR: NEGATIVE
STAPHYLOCOCCUS AUREUS: NEGATIVE

## 2014-06-30 LAB — APTT: aPTT: 36 seconds (ref 24–37)

## 2014-06-30 LAB — TYPE AND SCREEN
ABO/RH(D): A POS
Antibody Screen: NEGATIVE

## 2014-06-30 LAB — LACTIC ACID, PLASMA: Lactic Acid, Venous: 1 mmol/L (ref 0.5–2.2)

## 2014-06-30 MED ORDER — CETYLPYRIDINIUM CHLORIDE 0.05 % MT LIQD
7.0000 mL | Freq: Two times a day (BID) | OROMUCOSAL | Status: DC
  Administered 2014-06-30 – 2014-07-07 (×12): 7 mL via OROMUCOSAL

## 2014-06-30 MED ORDER — VANCOMYCIN HCL IN DEXTROSE 1-5 GM/200ML-% IV SOLN
1000.0000 mg | INTRAVENOUS | Status: AC
  Administered 2014-07-01: 1000 mg via INTRAVENOUS
  Filled 2014-06-30: qty 200

## 2014-06-30 NOTE — Progress Notes (Signed)
Utilization review completed. Eunique Balik, RN, BSN. 

## 2014-06-30 NOTE — Progress Notes (Signed)
Echocardiogram 2D Echocardiogram has been performed.  Mark Rios 06/30/2014, 10:48 AM

## 2014-06-30 NOTE — Progress Notes (Signed)
Procedure(s) (LRB): repair left diaphragmatic hernia (N/A) Subjective: Still having some discomfort in abdomen C/o hernia in right groin being swollen from time to time No nausea/ vomiting  No chest pain  Objective: Vital signs in last 24 hours: Temp:  [98.7 F (37.1 C)-99.9 F (37.7 C)] 99.2 F (37.3 C) (11/12 1551) Pulse Rate:  [94-122] 107 (11/12 1500) Cardiac Rhythm:  [-] Sinus tachycardia (11/12 0800) Resp:  [16-24] 21 (11/12 1500) BP: (102-166)/(64-90) 130/77 mmHg (11/12 1500) SpO2:  [89 %-95 %] 92 % (11/12 1500) Weight:  [147 lb 11.3 oz (67 kg)] 147 lb 11.3 oz (67 kg) (11/12 0500)  Hemodynamic parameters for last 24 hours:    Intake/Output from previous day: 11/11 0701 - 11/12 0700 In: 50 [IV Piggyback:50] Out: 700 [Urine:700] Intake/Output this shift: Total I/O In: 1294.2 [I.V.:1256.7; IV Piggyback:37.5] Out: 600 [Urine:600]  General appearance: alert, cooperative and no distress Neurologic: intact Heart: regular rate and rhythm Lungs: diminished breath sounds left base Abdomen: non distended, mildly tender in epigastric area with no peritoneal signs  Lab Results:  Recent Labs  06/29/14 1626 06/30/14 0509  WBC 13.7* 14.9*  HGB 17.5* 16.3  HCT 49.8 46.3  PLT 219 191   BMET:  Recent Labs  06/29/14 1626 06/30/14 0509  NA 137 134*  K 3.9 3.5*  CL 96 94*  CO2 29 24  GLUCOSE 121* 114*  BUN 12 12  CREATININE 0.75 0.68  CALCIUM 9.7 8.9    PT/INR:  Recent Labs  06/30/14 0509  LABPROT 15.4*  INR 1.20   ABG    Component Value Date/Time   PHART 7.443 06/30/2014 0417   HCO3 24.6* 06/30/2014 0417   TCO2 25.7 06/30/2014 0417   O2SAT 93.7 06/30/2014 0417   CBG (last 3)   Recent Labs  06/30/14 0754 06/30/14 1202  GLUCAP 120* 155*    Assessment/Plan: S/P Procedure(s) (LRB): repair left diaphragmatic hernia (N/A) 63 yo man with a 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. We discussed the expected hospital stay, overall recovery and short and long term outcomes. I informed him of the indications, risks, benefits and alternatives. He understands the risks include, but are not limited to death, stroke, MI, DVT/PE, bleeding, possible need for transfusion, infections, possible need for bowel resection, air leaks, as well as other organ system dysfunction including respiratory, renal, or GI complications.   He accepts the risks and agrees to proceed    LOS: 1 day    Rios,Mark C 06/30/2014
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
## 2014-06-30 NOTE — Progress Notes (Signed)
eLink Physician-Brief Progress Note Patient Name: Mark Rios DOB: 06-Feb-1951 MRN: [id number]   Date of Service  06/30/2014  HPI/Events of Note  [facility] of remote diaphragmatic hernia, now symptomatic. May require repair - currently stable in ICU.   eICU Interventions  Will follow. No interventions needed at this time. Consider stress PPI if he is to remain NPO      Intervention Category Evaluation Type: New Patient Evaluation  Antanette Richwine S. 06/30/2014, 12:55 AM

## 2014-07-01 ENCOUNTER — Encounter (HOSPITAL_COMMUNITY)
Admission: EM | Disposition: A | Discharge status: Home Health | Payer: Self-pay | Source: Home / Self Care | Attending: Thoracic Surgery (Cardiothoracic Vascular Surgery)

## 2014-07-01 ENCOUNTER — Encounter (HOSPITAL_COMMUNITY): Payer: Self-pay | Admitting: Certified Registered Nurse Anesthetist

## 2014-07-01 ENCOUNTER — Inpatient Hospital Stay (HOSPITAL_COMMUNITY): Payer: Medicare Other

## 2014-07-01 ENCOUNTER — Inpatient Hospital Stay (HOSPITAL_COMMUNITY): Payer: Medicare Other | Admitting: Anesthesiology

## 2014-07-01 HISTORY — PX: VIDEO ASSISTED THORACOSCOPY (VATS)/THOROCOTOMY: SHX6173

## 2014-07-01 HISTORY — PX: VIDEO BRONCHOSCOPY: SHX5072

## 2014-07-01 LAB — POCT I-STAT 3, ART BLOOD GAS (G3+)
Acid-Base Excess: 1 mmol/L (ref 0.0–2.0)
Acid-base deficit: 5 mmol/L — ABNORMAL HIGH (ref 0.0–2.0)
Bicarbonate: 21.8 mEq/L (ref 20.0–24.0)
Bicarbonate: 26.4 mEq/L — ABNORMAL HIGH (ref 20.0–24.0)
O2 Saturation: 99 %
O2 Saturation: 96 %
TCO2: 23 mmol/L (ref 0–100)
TCO2: 28 mmol/L (ref 0–100)
pCO2 arterial: 44 mmHg (ref 35.0–45.0)
pCO2 arterial: 44.3 mmHg (ref 35.0–45.0)
pH, Arterial: 7.303 — ABNORMAL LOW (ref 7.350–7.450)
pH, Arterial: 7.383 (ref 7.350–7.450)
pO2, Arterial: 138 mmHg — ABNORMAL HIGH (ref 80.0–100.0)
pO2, Arterial: 87 mmHg (ref 80.0–100.0)

## 2014-07-01 LAB — GLUCOSE, CAPILLARY
Glucose-Capillary: 128 mg/dL — ABNORMAL HIGH (ref 70–99)
Glucose-Capillary: 115 mg/dL — ABNORMAL HIGH (ref 70–99)
Glucose-Capillary: 128 mg/dL — ABNORMAL HIGH (ref 70–99)
Glucose-Capillary: 212 mg/dL — ABNORMAL HIGH (ref 70–99)

## 2014-07-01 SURGERY — VIDEO ASSISTED THORACOSCOPY (VATS)/THOROCOTOMY
Anesthesia: General | Site: Chest

## 2014-07-01 MED ORDER — ASPIRIN EC 325 MG PO TBEC
325.0000 mg | DELAYED_RELEASE_TABLET | Freq: Every day | ORAL | Status: DC
  Administered 2014-07-02 – 2014-07-07 (×5): 325 mg via ORAL
  Filled 2014-07-01 (×7): qty 1

## 2014-07-01 MED ORDER — SODIUM CHLORIDE 0.9 % IJ SOLN: 9.0000 mL | INTRAMUSCULAR | Status: DC | PRN

## 2014-07-01 MED ORDER — PHENYLEPHRINE HCL 10 MG/ML IJ SOLN
INTRAMUSCULAR | Status: DC | PRN
  Administered 2014-07-01: 40 ug via INTRAVENOUS
  Administered 2014-07-01 (×3): 80 ug via INTRAVENOUS
  Administered 2014-07-01: 40 ug via INTRAVENOUS
  Administered 2014-07-01: 80 ug via INTRAVENOUS

## 2014-07-01 MED ORDER — FENTANYL CITRATE 0.05 MG/ML IJ SOLN
INTRAMUSCULAR | Status: DC | PRN
  Administered 2014-07-01 (×4): 50 ug via INTRAVENOUS
  Administered 2014-07-01: 100 ug via INTRAVENOUS
  Administered 2014-07-01 (×2): 50 ug via INTRAVENOUS
  Administered 2014-07-01: 100 ug via INTRAVENOUS

## 2014-07-01 MED ORDER — OXYCODONE HCL 5 MG/5ML PO SOLN: 5.0000 mg | Freq: Once | ORAL | Status: DC | PRN

## 2014-07-01 MED ORDER — 0.9 % SODIUM CHLORIDE (POUR BTL) OPTIME
TOPICAL | Status: DC | PRN
  Administered 2014-07-01 (×3): 1000 mL

## 2014-07-01 MED ORDER — INSULIN ASPART 100 UNIT/ML ~~LOC~~ SOLN
0.0000 [IU] | SUBCUTANEOUS | Status: DC
  Administered 2014-07-01: 5 [IU] via SUBCUTANEOUS
  Administered 2014-07-02: 3 [IU] via SUBCUTANEOUS
  Administered 2014-07-02: 2 [IU] via SUBCUTANEOUS
  Administered 2014-07-02 (×2): 3 [IU] via SUBCUTANEOUS
  Administered 2014-07-03: 2 [IU] via SUBCUTANEOUS
  Administered 2014-07-03: 3 [IU] via SUBCUTANEOUS
  Administered 2014-07-03 – 2014-07-04 (×6): 2 [IU] via SUBCUTANEOUS

## 2014-07-01 MED ORDER — HYDROMORPHONE HCL 1 MG/ML IJ SOLN: 0.2500 mg | INTRAMUSCULAR | Status: DC | PRN

## 2014-07-01 MED ORDER — VECURONIUM BROMIDE 10 MG IV SOLR
INTRAVENOUS | Status: AC
  Filled 2014-07-01: qty 10

## 2014-07-01 MED ORDER — ALBUTEROL SULFATE (2.5 MG/3ML) 0.083% IN NEBU
2.5000 mg | INHALATION_SOLUTION | RESPIRATORY_TRACT | Status: DC | PRN
  Administered 2014-07-02 – 2014-07-04 (×3): 2.5 mg via RESPIRATORY_TRACT
  Filled 2014-07-01 (×4): qty 3

## 2014-07-01 MED ORDER — NALOXONE HCL 0.4 MG/ML IJ SOLN: 0.4000 mg | INTRAMUSCULAR | Status: DC | PRN

## 2014-07-01 MED ORDER — MIDAZOLAM HCL 2 MG/2ML IJ SOLN
INTRAMUSCULAR | Status: AC
  Filled 2014-07-01: qty 2

## 2014-07-01 MED ORDER — OXYCODONE HCL 5 MG PO TABS: 5.0000 mg | ORAL_TABLET | Freq: Once | ORAL | Status: DC | PRN

## 2014-07-01 MED ORDER — SODIUM CHLORIDE 0.9 % IV SOLN: INTRAVENOUS | Status: DC | PRN

## 2014-07-01 MED ORDER — SIMVASTATIN 40 MG PO TABS
40.0000 mg | ORAL_TABLET | Freq: Every evening | ORAL | Status: DC
  Filled 2014-07-01 (×2): qty 1

## 2014-07-01 MED ORDER — SENNOSIDES-DOCUSATE SODIUM 8.6-50 MG PO TABS
1.0000 | ORAL_TABLET | Freq: Every day | ORAL | Status: DC
  Administered 2014-07-02 – 2014-07-07 (×5): 1 via ORAL
  Filled 2014-07-01 (×8): qty 1

## 2014-07-01 MED ORDER — ONDANSETRON HCL 4 MG/2ML IJ SOLN
INTRAMUSCULAR | Status: DC | PRN
  Administered 2014-07-01: 4 mg via INTRAVENOUS

## 2014-07-01 MED ORDER — PROPOFOL 10 MG/ML IV BOLUS
INTRAVENOUS | Status: AC
  Filled 2014-07-01: qty 20

## 2014-07-01 MED ORDER — DIPHENHYDRAMINE HCL 12.5 MG/5ML PO ELIX
12.5000 mg | ORAL_SOLUTION | Freq: Four times a day (QID) | ORAL | Status: DC | PRN
  Filled 2014-07-01: qty 5

## 2014-07-01 MED ORDER — DEXTROSE 5 % IV SOLN: 1.5000 g | Freq: Two times a day (BID) | INTRAVENOUS | Status: DC

## 2014-07-01 MED ORDER — DEXAMETHASONE SODIUM PHOSPHATE 4 MG/ML IJ SOLN
INTRAMUSCULAR | Status: AC
  Filled 2014-07-01: qty 2

## 2014-07-01 MED ORDER — LORAZEPAM 2 MG/ML IJ SOLN
INTRAMUSCULAR | Status: AC
  Filled 2014-07-01: qty 1

## 2014-07-01 MED ORDER — DIPHENHYDRAMINE HCL 50 MG/ML IJ SOLN: 12.5000 mg | Freq: Four times a day (QID) | INTRAMUSCULAR | Status: DC | PRN

## 2014-07-01 MED ORDER — FENTANYL CITRATE 0.05 MG/ML IJ SOLN
INTRAMUSCULAR | Status: AC
  Filled 2014-07-01: qty 5

## 2014-07-01 MED ORDER — DEXAMETHASONE SODIUM PHOSPHATE 4 MG/ML IJ SOLN
INTRAMUSCULAR | Status: DC | PRN
  Administered 2014-07-01: 8 mg via INTRAVENOUS

## 2014-07-01 MED ORDER — ROCURONIUM BROMIDE 100 MG/10ML IV SOLN
INTRAVENOUS | Status: DC | PRN
  Administered 2014-07-01: 30 mg via INTRAVENOUS
  Administered 2014-07-01: 20 mg via INTRAVENOUS

## 2014-07-01 MED ORDER — BUPIVACAINE HCL (PF) 0.5 % IJ SOLN
INTRAMUSCULAR | Status: DC | PRN
  Administered 2014-07-01: 10 mL

## 2014-07-01 MED ORDER — FENTANYL CITRATE 0.05 MG/ML IJ SOLN
50.0000 ug | INTRAMUSCULAR | Status: DC | PRN
  Administered 2014-07-01: 50 ug via INTRAVENOUS

## 2014-07-01 MED ORDER — BISACODYL 5 MG PO TBEC
10.0000 mg | DELAYED_RELEASE_TABLET | Freq: Every day | ORAL | Status: DC
  Administered 2014-07-03: 10 mg via ORAL
  Filled 2014-07-01 (×3): qty 2

## 2014-07-01 MED ORDER — ONDANSETRON HCL 4 MG/2ML IJ SOLN: 4.0000 mg | Freq: Four times a day (QID) | INTRAMUSCULAR | Status: DC | PRN

## 2014-07-01 MED ORDER — ACETAMINOPHEN 500 MG PO TABS
1000.0000 mg | ORAL_TABLET | Freq: Four times a day (QID) | ORAL | Status: AC
  Administered 2014-07-02 – 2014-07-06 (×15): 1000 mg via ORAL
  Filled 2014-07-01 (×19): qty 2

## 2014-07-01 MED ORDER — STERILE WATER FOR INJECTION IJ SOLN
INTRAMUSCULAR | Status: AC
  Filled 2014-07-01: qty 10

## 2014-07-01 MED ORDER — FENTANYL CITRATE 0.05 MG/ML IJ SOLN
INTRAMUSCULAR | Status: AC
  Filled 2014-07-01: qty 2

## 2014-07-01 MED ORDER — ALBUTEROL SULFATE HFA 108 (90 BASE) MCG/ACT IN AERS
INHALATION_SPRAY | RESPIRATORY_TRACT | Status: DC | PRN
  Administered 2014-07-01 (×2): 4 via RESPIRATORY_TRACT

## 2014-07-01 MED ORDER — PIPERACILLIN-TAZOBACTAM 3.375 G IVPB
3.3750 g | Freq: Three times a day (TID) | INTRAVENOUS | Status: DC
  Administered 2014-07-01 – 2014-07-03 (×6): 3.375 g via INTRAVENOUS
  Filled 2014-07-01 (×8): qty 50

## 2014-07-01 MED ORDER — LACTATED RINGERS IV SOLN
INTRAVENOUS | Status: DC | PRN
  Administered 2014-07-01 (×2): via INTRAVENOUS

## 2014-07-01 MED ORDER — NEOSTIGMINE METHYLSULFATE 10 MG/10ML IV SOLN
INTRAVENOUS | Status: DC | PRN
Start: 2014-07-01 — End: 2014-07-01
  Administered 2014-07-01: 3 mg via INTRAVENOUS

## 2014-07-01 MED ORDER — DEXMEDETOMIDINE HCL IN NACL 200 MCG/50ML IV SOLN
INTRAVENOUS | Status: AC
  Filled 2014-07-01: qty 50

## 2014-07-01 MED ORDER — LACTATED RINGERS IV SOLN
INTRAVENOUS | Status: DC
  Administered 2014-07-01 (×2): via INTRAVENOUS

## 2014-07-01 MED ORDER — SUCCINYLCHOLINE CHLORIDE 20 MG/ML IJ SOLN
INTRAMUSCULAR | Status: DC | PRN
  Administered 2014-07-01: 100 mg via INTRAVENOUS

## 2014-07-01 MED ORDER — POTASSIUM CHLORIDE 10 MEQ/50ML IV SOLN
10.0000 meq | Freq: Every day | INTRAVENOUS | Status: DC | PRN
  Filled 2014-07-01: qty 50

## 2014-07-01 MED ORDER — GLYCOPYRROLATE 0.2 MG/ML IJ SOLN
INTRAMUSCULAR | Status: DC | PRN
  Administered 2014-07-01: 0.4 mg via INTRAVENOUS

## 2014-07-01 MED ORDER — FENTANYL CITRATE 0.05 MG/ML IJ SOLN: 25.0000 ug | INTRAMUSCULAR | Status: DC | PRN

## 2014-07-01 MED ORDER — BUPIVACAINE HCL (PF) 0.5 % IJ SOLN
INTRAMUSCULAR | Status: AC
  Filled 2014-07-01: qty 10

## 2014-07-01 MED ORDER — ALBUTEROL SULFATE HFA 108 (90 BASE) MCG/ACT IN AERS
INHALATION_SPRAY | RESPIRATORY_TRACT | Status: AC
  Filled 2014-07-01: qty 6.7

## 2014-07-01 MED ORDER — PHENYLEPHRINE HCL 10 MG/ML IJ SOLN
10.0000 mg | INTRAVENOUS | Status: DC | PRN
  Administered 2014-07-01: 20 ug/min via INTRAVENOUS

## 2014-07-01 MED ORDER — VECURONIUM BROMIDE 10 MG IV SOLR
INTRAVENOUS | Status: DC | PRN
  Administered 2014-07-01 (×2): 1 mg via INTRAVENOUS

## 2014-07-01 MED ORDER — ALBUMIN HUMAN 5 % IV SOLN
INTRAVENOUS | Status: DC | PRN
  Administered 2014-07-01 (×2): via INTRAVENOUS

## 2014-07-01 MED ORDER — ACETAMINOPHEN 160 MG/5ML PO SOLN
1000.0000 mg | Freq: Four times a day (QID) | ORAL | Status: AC
  Filled 2014-07-01: qty 40

## 2014-07-01 MED ORDER — OXYCODONE HCL 5 MG PO TABS: 5.0000 mg | ORAL_TABLET | ORAL | Status: DC | PRN

## 2014-07-01 MED ORDER — BUPIVACAINE 0.5 % ON-Q PUMP SINGLE CATH 400 ML
400.0000 mL | INJECTION | Status: AC
  Administered 2014-07-01: 400 mL
  Filled 2014-07-01: qty 400

## 2014-07-01 MED ORDER — LORAZEPAM 2 MG/ML IJ SOLN
1.0000 mg | Freq: Once | INTRAMUSCULAR | Status: AC
  Administered 2014-07-01: 1 mg via INTRAVENOUS

## 2014-07-01 MED ORDER — IPRATROPIUM BROMIDE 0.02 % IN SOLN
500.0000 ug | RESPIRATORY_TRACT | Status: DC | PRN
  Administered 2014-07-02: 500 ug via RESPIRATORY_TRACT
  Filled 2014-07-01: qty 2.5

## 2014-07-01 MED ORDER — PROPOFOL 10 MG/ML IV BOLUS
INTRAVENOUS | Status: DC | PRN
  Administered 2014-07-01: 20 mg via INTRAVENOUS
  Administered 2014-07-01: 50 mg via INTRAVENOUS
  Administered 2014-07-01: 30 mg via INTRAVENOUS
  Administered 2014-07-01: 150 mg via INTRAVENOUS
  Administered 2014-07-01: 20 mg via INTRAVENOUS

## 2014-07-01 MED ORDER — ENOXAPARIN SODIUM 40 MG/0.4ML ~~LOC~~ SOLN
40.0000 mg | SUBCUTANEOUS | Status: DC
  Administered 2014-07-02: 40 mg via SUBCUTANEOUS
  Filled 2014-07-01 (×2): qty 0.4

## 2014-07-01 MED ORDER — IPRATROPIUM BROMIDE 0.02 % IN SOLN: 0.5000 mg | RESPIRATORY_TRACT | Status: DC

## 2014-07-01 MED ORDER — DEXTROSE-NACL 5-0.9 % IV SOLN
INTRAVENOUS | Status: DC
  Administered 2014-07-01: 16:00:00 via INTRAVENOUS
  Administered 2014-07-02: 125 mL/h via INTRAVENOUS
  Administered 2014-07-02: via INTRAVENOUS
  Administered 2014-07-02 – 2014-07-03 (×2): 125 mL/h via INTRAVENOUS
  Administered 2014-07-04: 18:00:00 via INTRAVENOUS
  Filled 2014-07-01: qty 1000

## 2014-07-01 MED ORDER — TRAMADOL HCL 50 MG PO TABS: 50.0000 mg | ORAL_TABLET | Freq: Four times a day (QID) | ORAL | Status: DC | PRN

## 2014-07-01 MED ORDER — FENTANYL 10 MCG/ML IV SOLN
INTRAVENOUS | Status: DC
  Administered 2014-07-01: 17:00:00 via INTRAVENOUS
  Administered 2014-07-01: 0 ug via INTRAVENOUS
  Administered 2014-07-02 – 2014-07-03 (×6): 15 ug via INTRAVENOUS
  Administered 2014-07-04: 30 ug via INTRAVENOUS
  Administered 2014-07-04 (×4): 0 ug via INTRAVENOUS
  Administered 2014-07-04: 15 ug via INTRAVENOUS
  Administered 2014-07-05 (×2): 0 ug via INTRAVENOUS
  Filled 2014-07-01: qty 50

## 2014-07-01 SURGICAL SUPPLY — 135 items
ADH SKN CLS APL DERMABOND .7 (GAUZE/BANDAGES/DRESSINGS) ×2
BANDAGE HEMOSTAT MRDH 4X4 STRL (MISCELLANEOUS) IMPLANT
BNDG HEMOSTAT MRDH 4X4 STRL (MISCELLANEOUS)
CANISTER SUCTION 2500CC (MISCELLANEOUS) ×6 IMPLANT
CATH FOLEY 2WAY SLVR 30CC 16FR (CATHETERS) ×2 IMPLANT
CATH KIT ON Q 5IN SLV (PAIN MANAGEMENT) ×2 IMPLANT
CATH ROBINSON RED A/P 14FR (CATHETERS) IMPLANT
CATH ROBINSON RED A/P 18FR (CATHETERS) ×4 IMPLANT
CATH THORACIC 28FR (CATHETERS) IMPLANT
CATH THORACIC 36FR (CATHETERS) IMPLANT
CATH THORACIC 36FR RT ANG (CATHETERS) IMPLANT
CLIP TI LARGE 6 (CLIP) IMPLANT
CLIP TI MEDIUM 24 (CLIP) IMPLANT
CLIP TI MEDIUM 6 (CLIP) ×4 IMPLANT
CLIP TI WIDE RED SMALL 24 (CLIP) IMPLANT
CLOSURE WOUND 1/2 X4 (GAUZE/BANDAGES/DRESSINGS) ×1
CONN ST 1/4X3/8  BEN (MISCELLANEOUS) ×2
CONN ST 1/4X3/8 BEN (MISCELLANEOUS) IMPLANT
CONT SPEC 4OZ CLIKSEAL STRL BL (MISCELLANEOUS) ×2 IMPLANT
COVER SURGICAL LIGHT HANDLE (MISCELLANEOUS) ×6 IMPLANT
DERMABOND ADVANCED (GAUZE/BANDAGES/DRESSINGS) ×2
DERMABOND ADVANCED .7 DNX12 (GAUZE/BANDAGES/DRESSINGS) IMPLANT
DRAIN CHANNEL 32F RND 10.7 FF (WOUND CARE) ×2 IMPLANT
DRAIN PENROSE 1/2X36 STERILE (WOUND CARE) ×4 IMPLANT
DRAIN SUMP SARATOGA 24F (WOUND CARE) ×2 IMPLANT
DRAPE BILATERAL SPLIT (DRAPES) ×2 IMPLANT
DRAPE CAMERA VIDEO/LASER (DRAPES) ×2 IMPLANT
DRAPE CV SPLIT W-CLR ANES SCRN (DRAPES) ×2 IMPLANT
DRAPE INCISE IOBAN 66X45 STRL (DRAPES) ×4 IMPLANT
DRAPE PROXIMA HALF (DRAPES) ×4 IMPLANT
DRAPE SLUSH/WARMER DISC (DRAPES) IMPLANT
DRAPE STERI WOUND 35X35 8 5/8 (DRAPES) ×2 IMPLANT
DRSG COVADERM 4X14 (GAUZE/BANDAGES/DRESSINGS) ×2 IMPLANT
ELECT BLADE 4.0 EZ CLEAN MEGAD (MISCELLANEOUS) ×4
ELECT BLADE 6.5 EXT (BLADE) ×4 IMPLANT
ELECT NDL TIP 2.8 STRL (NEEDLE) ×2 IMPLANT
ELECT NEEDLE TIP 2.8 STRL (NEEDLE) IMPLANT
ELECT REM PT RETURN 9FT ADLT (ELECTROSURGICAL) ×4
ELECTRODE BLDE 4.0 EZ CLN MEGD (MISCELLANEOUS) ×2 IMPLANT
ELECTRODE REM PT RTRN 9FT ADLT (ELECTROSURGICAL) ×4 IMPLANT
EVACUATOR SILICONE 100CC (DRAIN) IMPLANT
GAUZE SPONGE 4X4 12PLY STRL (GAUZE/BANDAGES/DRESSINGS) ×4 IMPLANT
GLOVE BIO SURGEON STRL SZ 6 (GLOVE) ×2 IMPLANT
GLOVE BIO SURGEON STRL SZ 6.5 (GLOVE) ×4 IMPLANT
GLOVE BIO SURGEON STRL SZ7.5 (GLOVE) ×4 IMPLANT
GLOVE BIO SURGEONS STRL SZ 6.5 (GLOVE)
GLOVE BIOGEL PI IND STRL 6.5 (GLOVE) IMPLANT
GLOVE BIOGEL PI INDICATOR 6.5 (GLOVE) ×8
GLOVE SURG SS PI 6.5 STRL IVOR (GLOVE) ×2 IMPLANT
GLOVE SURG SS PI 7.0 STRL IVOR (GLOVE) ×6 IMPLANT
GOWN BRE IMP SLV AUR LG STRL (GOWN DISPOSABLE) ×4 IMPLANT
GOWN BRE IMP SLV AUR XL STRL (GOWN DISPOSABLE) ×4 IMPLANT
GOWN STRL REUS W/ TWL LRG LVL3 (GOWN DISPOSABLE) ×4 IMPLANT
GOWN STRL REUS W/ TWL XL LVL3 (GOWN DISPOSABLE) IMPLANT
GOWN STRL REUS W/TWL LRG LVL3 (GOWN DISPOSABLE) ×8
GOWN STRL REUS W/TWL XL LVL3 (GOWN DISPOSABLE) ×16
HEMOSTAT SURGICEL 2X14 (HEMOSTASIS) ×2 IMPLANT
INSERT FOGARTY 61MM (MISCELLANEOUS) IMPLANT
KIT BASIN OR (CUSTOM PROCEDURE TRAY) ×4 IMPLANT
KIT ROOM TURNOVER OR (KITS) ×4 IMPLANT
LIGASURE IMPACT 36 18CM CVD LR (INSTRUMENTS) ×2 IMPLANT
LOOP VESSEL MAXI BLUE (MISCELLANEOUS) ×2 IMPLANT
LOOP VESSEL MINI RED (MISCELLANEOUS) IMPLANT
NDL 18GX1X1/2 (RX/OR ONLY) (NEEDLE) IMPLANT
NEEDLE 18GX1X1/2 (RX/OR ONLY) (NEEDLE) ×4 IMPLANT
NS IRRIG 1000ML POUR BTL (IV SOLUTION) ×8 IMPLANT
PACK CHEST (CUSTOM PROCEDURE TRAY) ×4 IMPLANT
PAD ARMBOARD 7.5X6 YLW CONV (MISCELLANEOUS) ×8 IMPLANT
PATCH GORETEX 5X7 (Vascular Products) ×2 IMPLANT
PENCIL BUTTON HOLSTER BLD 10FT (ELECTRODE) ×2 IMPLANT
PIN SAFETY STERILE (MISCELLANEOUS) IMPLANT
PLUG CATH AND CAP STER (CATHETERS) ×4 IMPLANT
PROBE NERVBE PRASS .33 (MISCELLANEOUS) ×2 IMPLANT
RETAINER VISCERA MED (MISCELLANEOUS) ×2 IMPLANT
SOLUTION ANTI FOG 6CC (MISCELLANEOUS) ×2 IMPLANT
SPECIMEN JAR LARGE (MISCELLANEOUS) IMPLANT
SPECIMEN JAR MEDIUM (MISCELLANEOUS) ×2 IMPLANT
SPONGE GAUZE 4X4 12PLY STER LF (GAUZE/BANDAGES/DRESSINGS) ×2 IMPLANT
SPONGE LAP 18X18 X RAY DECT (DISPOSABLE) ×8 IMPLANT
SPONGE TONSIL 1.25 RF SGL STRG (GAUZE/BANDAGES/DRESSINGS) IMPLANT
STAPLER VISISTAT 35W (STAPLE) ×2 IMPLANT
STRIP CLOSURE SKIN 1/2X4 (GAUZE/BANDAGES/DRESSINGS) ×1 IMPLANT
SUCTION POOLE TIP (SUCTIONS) IMPLANT
SUT ETHILON 2 0 FS 18 (SUTURE) ×2 IMPLANT
SUT ETHILON 3 0 FSL (SUTURE) ×2 IMPLANT
SUT PROLENE 0 CT (SUTURE) ×10 IMPLANT
SUT PROLENE 0 CT 1 CR/8 (SUTURE) IMPLANT
SUT PROLENE 1 XLH (SUTURE) ×6 IMPLANT
SUT PROLENE 2 0 MH 48 (SUTURE) ×2 IMPLANT
SUT PROLENE 2 TP 1 (SUTURE) ×4 IMPLANT
SUT PROLENE 3 0 RB 1 (SUTURE) IMPLANT
SUT PROLENE 4 0 RB 1 (SUTURE)
SUT PROLENE 4-0 RB1 .5 CRCL 36 (SUTURE) IMPLANT
SUT SILK  1 MH (SUTURE) ×4
SUT SILK 1 MH (SUTURE) ×2 IMPLANT
SUT SILK 1 TIES 10X30 (SUTURE) ×2 IMPLANT
SUT SILK 2 0 (SUTURE) ×4
SUT SILK 2 0 SH CR/8 (SUTURE) ×2 IMPLANT
SUT SILK 2 0SH CR/8 30 (SUTURE) ×2 IMPLANT
SUT SILK 2-0 18XBRD TIE 12 (SUTURE) IMPLANT
SUT SILK 3 0 SH 30 (SUTURE) ×2 IMPLANT
SUT SILK 3 0 SH CR/8 (SUTURE) IMPLANT
SUT SILK 3 0SH CR/8 30 (SUTURE) IMPLANT
SUT SILK 4 0 SH CR/8 (SUTURE) ×2 IMPLANT
SUT VIC AB 1 CTX 27 (SUTURE) ×2 IMPLANT
SUT VIC AB 1 CTX 36 (SUTURE) ×4
SUT VIC AB 1 CTX36XBRD ANBCTR (SUTURE) IMPLANT
SUT VIC AB 2-0 CT1 18 (SUTURE) ×2 IMPLANT
SUT VIC AB 2-0 CTX 36 (SUTURE) ×6 IMPLANT
SUT VIC AB 3-0 MH 27 (SUTURE) IMPLANT
SUT VIC AB 3-0 SH 18 (SUTURE) IMPLANT
SUT VIC AB 3-0 X1 27 (SUTURE) ×8 IMPLANT
SUT VIC AB 4-0 SH 18 (SUTURE) ×4 IMPLANT
SUT VICRYL 2 TP 1 (SUTURE) ×2 IMPLANT
SYR 20CC LL (SYRINGE) IMPLANT
SYR 5ML LUER SLIP (SYRINGE) ×2 IMPLANT
SYR CONTROL 10ML LL (SYRINGE) ×2 IMPLANT
SYR TOOMEY 50ML (SYRINGE) ×2 IMPLANT
SYRINGE 10CC LL (SYRINGE) IMPLANT
SYSTEM SAHARA CHEST DRAIN RE-I (WOUND CARE) ×6 IMPLANT
TAPE CLOTH SURG 4X10 WHT LF (GAUZE/BANDAGES/DRESSINGS) ×2 IMPLANT
TAPE UMBILICAL 1/8 X36 TWILL (MISCELLANEOUS) ×2 IMPLANT
TOWEL OR 17X24 6PK STRL BLUE (TOWEL DISPOSABLE) ×2 IMPLANT
TOWEL OR 17X26 10 PK STRL BLUE (TOWEL DISPOSABLE) ×4 IMPLANT
TRAP SPECIMEN MUCOUS 40CC (MISCELLANEOUS) ×2 IMPLANT
TRAY FOLEY CATH 14FRSI W/METER (CATHETERS) ×2 IMPLANT
TRAY FOLEY CATH 16FRSI W/METER (SET/KITS/TRAYS/PACK) ×2 IMPLANT
TROCAR XCEL BLADELESS 5X75MML (TROCAR) ×2 IMPLANT
TUBE CONNECTING 12'X1/4 (SUCTIONS)
TUBE CONNECTING 12X1/4 (SUCTIONS) IMPLANT
TUBE ENDOTRAC EMG 7X10.2 (MISCELLANEOUS) IMPLANT
TUBE ENDOTRAC EMG 8X11.3 (MISCELLANEOUS) IMPLANT
TUBE ENDOTRACH  EMG 6MMTUBE EN (MISCELLANEOUS)
TUBE ENDOTRACH EMG 6MMTUBE EN (MISCELLANEOUS) IMPLANT
WATER STERILE IRR 1000ML POUR (IV SOLUTION) ×4 IMPLANT

## 2014-07-01 NOTE — Progress Notes (Addendum)
NUTRITION FOLLOW UP  INTERVENTION: No nutrition intervention warranted at this time RD to continue to follow  NUTRITION DIAGNOSIS: Inadequate oral intake, resolved  Goal: Pt to meet >/= 90% of their estimated nutrition needs, met  Monitor:  PO intake, weight, labs, I/O's  ASSESSMENT: 63 year old Male seen in the ED at [facility] with complaints of cramping abdominal pain. CT scans performed showed evidence of splenic flexure colon in the hernia.  Patient s/p procedures 11/14: LEFT THORACOTOMY REPAIR OF DIAPHRAGMATIC HERNIA  Pt currently on a Carbohydrate Modified.  Reports a good appetite.  PO intake 100% for breakfast this AM.  Declined addition of oral nutrition supplements.  Height: Ht Readings from Last 1 Encounters:  06/30/14 _0  (1.676 m)    Weight: Wt Readings from Last 1 Encounters:  07/01/14 147 lb 11.3 oz (67 kg)    BMI:  Body mass index is 23.85 kg/(m^2).  Estimated Nutritional Needs: Kcal: 1800-2000 Protein: 100-110 gm Fluid: 1.8-2.0 L  Skin: Intact  Diet Order: Carbohydrate Modified    Intake/Output Summary (Last 24 hours) at 07/01/14 1148 Last data filed at 07/01/14 1145  Gross per 24 hour  Intake   1295 ml  Output   1740 ml  Net   -445 ml    Labs:   Recent Labs Lab 06/28/14 0110 06/29/14 1626 06/30/14 0509  NA 140 137 134*  K 4.1 3.9 3.5*  CL 100 96 94*  CO2 _1 BUN _2 CREATININE 0.87 0.75 0.68  CALCIUM 10.0 9.7 8.9  GLUCOSE 130* 121* 114*    CBG (last 3)   Recent Labs  06/30/14 1811 06/30/14 2138 07/01/14 0727  GLUCAP 99 128* 128*    Scheduled Meds: . [MAR Hold] antiseptic oral rinse  7 mL Mouth Rinse BID  . aspirin EC  81 mg Oral Daily  . [MAR Hold] budesonide-formoterol  2 puff Inhalation BID  . [MAR Hold] carvedilol  12.5 mg Oral BID WC  . [MAR Hold] enoxaparin (LOVENOX) injection  30 mg Subcutaneous Q24H  . insulin aspart  0-5 Units Subcutaneous QHS  . insulin aspart  0-9 Units Subcutaneous  TID WC  . LORazepam      . nicotine  14 mg Transdermal Daily  . piperacillin-tazobactam (ZOSYN)  IV  3.375 g Intravenous 3 times per day  . vancomycin  1,000 mg Intravenous On Call to OR    Continuous Infusions: . dextrose 5 % and 0.45 % NaCl with KCl 10 mEq/L 100 mL/hr at 07/01/14 1000  . lactated ringers 50 mL/hr at 07/01/14 1044    Past Medical History  Diagnosis Date  . Abdominal hernia   . Diabetes mellitus without complication   . COPD (chronic obstructive pulmonary disease)   . Hypertension   . High cholesterol   . Stroke   . Arrhythmia   . Pneumothorax     Past Surgical History  Procedure Laterality Date  . Coronary artery bypass graft      x3  . Hemorroidectomy      Arthur Holms, RD, LDN Pager #: [phone] After-Hours Pager #: [phone]

## 2014-07-01 NOTE — Anesthesia Preprocedure Evaluation (Signed)
Anesthesia Evaluation  Patient identified by MRN, date of birth, ID band Patient awake    Reviewed: Allergy & Precautions, H&P , NPO status , Patient's Chart, lab work & pertinent test results  Airway Mallampati: II   Neck ROM: full    Dental   Pulmonary COPDCurrent Smoker,          Cardiovascular hypertension, + CAD and + CABG + dysrhythmias     Neuro/Psych  Neuromuscular disease CVA    GI/Hepatic   Endo/Other  diabetes, Type 2  Renal/GU      Musculoskeletal   Abdominal   Peds  Hematology   Anesthesia Other Findings   Reproductive/Obstetrics                             Anesthesia Physical Anesthesia Plan  ASA: III  Anesthesia Plan: General   Post-op Pain Management:    Induction: Intravenous  Airway Management Planned: Double Lumen EBT  Additional Equipment: Arterial line, CVP and Ultrasound Guidance Line Placement  Intra-op Plan:   Post-operative Plan: Extubation in OR  Informed Consent: I have reviewed the patients History and Physical, chart, labs and discussed the procedure including the risks, benefits and alternatives for the proposed anesthesia with the patient or authorized representative who has indicated his/her understanding and acceptance.     Plan Discussed with: CRNA, Anesthesiologist and Surgeon  Anesthesia Plan Comments:         Anesthesia Quick Evaluation

## 2014-07-01 NOTE — Anesthesia Procedure Notes (Signed)
Procedure Name: Intubation Date/Time: 07/01/2014 11:09 AM Performed by: Adonis HousekeeperNGELL, Rondel Episcopo M Pre-anesthesia Checklist: Patient identified, Emergency Drugs available, Suction available and Patient being monitored Patient Re-evaluated:Patient Re-evaluated prior to inductionOxygen Delivery Method: Circle system utilized Preoxygenation: Pre-oxygenation with 100% oxygen Intubation Type: IV induction and Rapid sequence Laryngoscope Size: Mac and 4 Grade View: Grade I Endobronchial tube: Left, Double lumen EBT, EBT position confirmed by auscultation and EBT position confirmed by fiberoptic bronchoscope and 37 Fr Number of attempts: 1 Airway Equipment and Method: Stylet Placement Confirmation: ETT inserted through vocal cords under direct vision,  positive ETCO2 and breath sounds checked- equal and bilateral Tube secured with: Tape Dental Injury: Teeth and Oropharynx as per pre-operative assessment

## 2014-07-01 NOTE — Progress Notes (Signed)
TCTS BRIEF SICU PROGRESS NOTE  Day of Surgery  S/P Procedure(s) (LRB): with repair left diaphragmatic hernia  (Left) VIDEO BRONCHOSCOPY (N/A)   Extubated approx 30 min ago Awake and alert, breathing comfortably NSR w/ stable vitals Chest tube output low UOP adequate  Plan: Continue routine early postop  OWEN,CLARENCE H 07/01/2014 8:20 PM

## 2014-07-01 NOTE — Brief Op Note (Addendum)
      [address]       [place] [id number]             [phone]     06/29/2014 - 07/01/2014  2:52 PM  PATIENT:  Mark Rios  63 y.o. male  PRE-OPERATIVE DIAGNOSIS:  LEFT DIAPHRAGMATIC HERNIA  POST-OPERATIVE DIAGNOSIS:  LEFT DIAPHRAGMATIC HERNIA  PROCEDURE:  Procedure(s): LEFT THORACOTOMY WITH REPAIR OF DIAPHRAGMATIC HERNIA WITH GORTEX PATCH VIDEO BRONCHOSCOPY  SURGEON:  Surgeon(s): Loreli SlotSteven C Anistyn Graddy, MD  PHYSICIAN ASSISTANT: WAYNE GOLD PA-C  ANESTHESIA:   general  SPECIMEN:  Source of Specimen:  OMENTUM  DISPOSITION OF SPECIMEN:  Pathology  DRAINS: (1 26F) Blake drain(s) in the LEFT HEMITHORAX   PATIENT CONDITION:  PACU - hemodynamically stable.  PRE-OPERATIVE WEIGHT: 67kg  EBL 100 CC  COMPLICATIONS: NO KNOWN  FINDINGS: Defect in central tendon of left hemidiaphragm. Omentum and colon herniated into chest. Omentum resected. Colon viable. Mucous plugging prevented left lung from re-expanding. Bronchoscopy performed to evacuate mucous plugs.

## 2014-07-01 NOTE — Procedures (Signed)
Extubation Procedure Note  Patient Details:   Name: Alanda AmassWoodrow S Earll DOB: Jun 17, 1951 MRN: [id number]   Airway Documentation:  Airway 8.5 mm (Active)  Secured at (cm) 23 cm 07/01/2014  3:57 PM  Measured From Lips 07/01/2014  3:57 PM  Secured Location Right 07/01/2014  3:57 PM  Secured By Pink Tape 07/01/2014  3:57 PM  Site Condition Dry 07/01/2014  3:57 PM    Evaluation  O2 sats: stable throughout Complications: No apparent complications Patient did tolerate procedure well. Bilateral Breath Sounds: Clear Suctioning: Airway, Oral Yes   Patient demonstrated NIF -30, VC 1.1L prior to extubation.  Patient was able to follow commands, stick out tongue, and leak was present when cuff was deflated before extubation.  Patient extubated to 4L [place], sats 95%.  Donnelly AngelicaMcCollum, Zuly Belkin Ann 07/01/2014, 7:54 PM

## 2014-07-01 NOTE — Interval H&P Note (Signed)
History and Physical Interval Note:  07/01/2014 9:51 AM  Mark Rios  has presented today for surgery, with the diagnosis of DIAPHRAGMATIC HERNIA  The various methods of treatment have been discussed with the patient and family. After consideration of risks, benefits and other options for treatment, the patient has consented to  Procedure(s): repair left diaphragmatic hernia (N/A) as a surgical intervention .  The patient's history has been reviewed, patient examined, no change in status, stable for surgery.  I have reviewed the patient's chart and labs.  Questions were answered to the patient's satisfaction.     [name]

## 2014-07-01 NOTE — Progress Notes (Signed)
Pt. Awake sitting at the side of the bed moaning, stated not feeling good as if he is gonna pass out, with severe abdominal pain, flushed face. Abdomen softly distended with hypoactive bowel sound. HR 110's-120's due to pain. BP stable. Dr. Dorris FetchHendrickson was paged and with orders made. Fentanyl dose was changed and given 50mcg. IV and ativan 1mg . X1.  Pt. Back lying in bed and appears to be comfortable. Will continue to monitor pts. pain and vital signs.

## 2014-07-01 NOTE — Plan of Care (Signed)
Problem: Phase I Progression Outcomes Goal: Pain controlled with appropriate interventions Outcome: Progressing Goal: Incision/dressings dry and intact Outcome: Completed/Met Date Met:  07/01/14 Goal: Tubes/drains patent Outcome: Completed/Met Date Met:  07/01/14

## 2014-07-01 NOTE — Plan of Care (Signed)
Problem: Phase I Progression Outcomes Goal: Vital signs/hemodynamically stable Outcome: Completed/Met Date Met:  07/01/14

## 2014-07-01 NOTE — Transfer of Care (Signed)
Immediate Anesthesia Transfer of Care Note  Patient: Mark Rios  Procedure(s) Performed: Procedure(s): with repair left diaphragmatic hernia  (Left) VIDEO BRONCHOSCOPY (N/A)  Patient Location: SICU  Anesthesia Type:General  Level of Consciousness: sedated  Airway & Oxygen Therapy: Patient Spontanous Breathing, Patient remains intubated per anesthesia plan and Patient placed on Ventilator (see vital sign flow sheet for setting)  Post-op Assessment: Report given to PACU RN and Post -op Vital signs reviewed and stable  Post vital signs: Reviewed and stable  Complications: No apparent anesthesia complications

## 2014-07-01 NOTE — Progress Notes (Signed)
Pt transported with transporter via bed to holding area (pre-op).  Per Dr. Dorris FetchHendrickson, okay to travel without RN.  VSS. Pt alert and oriented.  Wife at bedside.  Cell phone sent with wife.

## 2014-07-02 ENCOUNTER — Inpatient Hospital Stay (HOSPITAL_COMMUNITY): Payer: Medicare Other

## 2014-07-02 LAB — BASIC METABOLIC PANEL
ANION GAP: 13 (ref 5–15)
Anion gap: 13 (ref 5–15)
BUN: 26 mg/dL — ABNORMAL HIGH (ref 6–23)
BUN: 29 mg/dL — ABNORMAL HIGH (ref 6–23)
CALCIUM: 8.1 mg/dL — AB (ref 8.4–10.5)
CHLORIDE: 100 meq/L (ref 96–112)
CHLORIDE: 99 meq/L (ref 96–112)
CO2: 23 mEq/L (ref 19–32)
CO2: 22 mEq/L (ref 19–32)
Calcium: 7.9 mg/dL — ABNORMAL LOW (ref 8.4–10.5)
Creatinine, Ser: 2.43 mg/dL — ABNORMAL HIGH (ref 0.50–1.35)
Creatinine, Ser: 3.04 mg/dL — ABNORMAL HIGH (ref 0.50–1.35)
GFR calc Af Amer: 31 mL/min — ABNORMAL LOW (ref >90)
GFR calc non Af Amer: 27 mL/min — ABNORMAL LOW (ref >90)
GFR calc non Af Amer: 21 mL/min — ABNORMAL LOW (ref >90)
GFR, EST AFRICAN AMERICAN: 24 mL/min — AB (ref >90)
Glucose, Bld: 154 mg/dL — ABNORMAL HIGH (ref 70–99)
Glucose, Bld: 154 mg/dL — ABNORMAL HIGH (ref 70–99)
POTASSIUM: 4.2 meq/L (ref 3.7–5.3)
Potassium: 4.3 mEq/L (ref 3.7–5.3)
Sodium: 136 mEq/L — ABNORMAL LOW (ref 137–147)
Sodium: 134 mEq/L — ABNORMAL LOW (ref 137–147)

## 2014-07-02 LAB — CBC
HCT: 40.7 % (ref 39.0–52.0)
Hemoglobin: 14 g/dL (ref 13.0–17.0)
MCH: 31.6 pg (ref 26.0–34.0)
MCHC: 34.4 g/dL (ref 30.0–36.0)
MCV: 91.9 fL (ref 78.0–100.0)
PLATELETS: 184 10*3/uL (ref 150–400)
RBC: 4.43 MIL/uL (ref 4.22–5.81)
RDW: 14 % (ref 11.5–15.5)
WBC: 15.4 10*3/uL — AB (ref 4.0–10.5)

## 2014-07-02 LAB — GLUCOSE, CAPILLARY
Glucose-Capillary: 153 mg/dL — ABNORMAL HIGH (ref 70–99)
Glucose-Capillary: 128 mg/dL — ABNORMAL HIGH (ref 70–99)
Glucose-Capillary: 186 mg/dL — ABNORMAL HIGH (ref 70–99)

## 2014-07-02 LAB — POCT I-STAT 3, ART BLOOD GAS (G3+)
Acid-base deficit: 1 mmol/L (ref 0.0–2.0)
Bicarbonate: 24.6 mEq/L — ABNORMAL HIGH (ref 20.0–24.0)
O2 Saturation: 96 %
Patient temperature: 98
TCO2: 26 mmol/L (ref 0–100)
pCO2 arterial: 40.9 mmHg (ref 35.0–45.0)
pH, Arterial: 7.385 (ref 7.350–7.450)
pO2, Arterial: 80 mmHg (ref 80.0–100.0)

## 2014-07-02 MED ORDER — ENOXAPARIN SODIUM 30 MG/0.3ML ~~LOC~~ SOLN
30.0000 mg | SUBCUTANEOUS | Status: DC
  Administered 2014-07-03 – 2014-07-07 (×4): 30 mg via SUBCUTANEOUS
  Filled 2014-07-02 (×6): qty 0.3

## 2014-07-02 NOTE — ED Provider Notes (Signed)
CSN: [id number]     Arrival date & time 06/28/14  0048 History   First MD Initiated Contact with Patient 06/28/14 0050     Chief Complaint  Patient presents with  . Hernia     (Consider location/radiation/quality/duration/timing/severity/associated sxs/prior Treatment) HPI.Marland Kitchen.Marland Kitchen.Marland Kitchen.Marland Kitchen.periumbilical abdominal pain for 24 hours. Patient has known diaphragmatic and periumbilical hernia. He is able to eat without vomiting. No fever or chills.he is scheduled to see general surgeon in the morning for his symptoms. Severity is moderate. Palpation makes symptoms worse. No radiation of pain.    Past Medical History  Diagnosis Date  . Abdominal hernia   . Diabetes mellitus without complication   . COPD (chronic obstructive pulmonary disease)   . Hypertension   . High cholesterol   . Stroke   . Arrhythmia   . Pneumothorax    Past Surgical History  Procedure Laterality Date  . Coronary artery bypass graft      x3  . Hemorroidectomy     No family history on file. History  Substance Use Topics  . Smoking status: Current Some Day Smoker -- 48 years    Types: Cigarettes  . Smokeless tobacco: Never Used  . Alcohol Use: No    Review of Systems  All other systems reviewed and are negative.     Allergies  Review of patient's allergies indicates no known allergies.  Home Medications   Prior to Admission medications   Medication Sig Start Date End Date Taking? Authorizing Provider  albuterol (PROVENTIL HFA;VENTOLIN HFA) 108 (90 BASE) MCG/ACT inhaler Inhale 2 puffs into the lungs every 6 (six) hours as needed for wheezing or shortness of breath.   Yes Historical Provider, MD  albuterol (PROVENTIL) (2.5 MG/3ML) 0.083% nebulizer solution Take 2.5 mg by nebulization every 4 (four) hours as needed for wheezing.   Yes Historical Provider, MD  aspirin 325 MG EC tablet Take 325 mg by mouth at bedtime.    Yes Historical Provider, MD  carvedilol (COREG) 12.5 MG tablet Take 18.25 mg by mouth 2 (two)  times daily with a meal.    Yes Historical Provider, MD  ipratropium (ATROVENT) 0.02 % nebulizer solution Take 500 mcg by nebulization every 4 (four) hours as needed for wheezing.   Yes Historical Provider, MD  lisinopril (PRINIVIL,ZESTRIL) 10 MG tablet Take 10 mg by mouth daily.   Yes Historical Provider, MD  metFORMIN (GLUCOPHAGE) 500 MG tablet Take 500 mg by mouth 2 (two) times daily with a meal.   Yes Historical Provider, MD  simvastatin (ZOCOR) 40 MG tablet Take 40 mg by mouth every evening.   Yes Historical Provider, MD  oxyCODONE-acetaminophen (PERCOCET) 5-325 MG per tablet Take 2 tablets by mouth every 4 (four) hours as needed. Patient taking differently: Take 1-2 tablets by mouth every 4 (four) hours as needed for moderate pain.  06/28/14   Donnetta HutchingBrian Giovanni Bath, MD  promethazine (PHENERGAN) 25 MG tablet Take 1 tablet (25 mg total) by mouth every 6 (six) hours as needed. 06/28/14   Donnetta HutchingBrian Enoch Moffa, MD   BP 143/79 mmHg  Pulse 84  Temp(Src) 97.9 F (36.6 C) (Oral)  Resp 17  Ht 5\' 6"  (1.676 m)  Wt 165 lb (74.844 kg)  BMI 26.64 kg/m2  SpO2 93% Physical Exam  Constitutional: He is oriented to person, place, and time. He appears well-developed and well-nourished.  HENT:  Head: Normocephalic and atraumatic.  Eyes: Conjunctivae and EOM are normal. Pupils are equal, round, and reactive to light.  Neck: Normal range of motion. Neck  supple.  Cardiovascular: Normal rate, regular rhythm and normal heart sounds.   Pulmonary/Chest: Effort normal and breath sounds normal.  Abdominal: Soft. Bowel sounds are normal.  Minimal periumbilical tenderness with questionable small hernia. Small inguinal hernia reduced  Musculoskeletal: Normal range of motion.  Neurological: He is alert and oriented to person, place, and time.  Skin: Skin is warm and dry.  Psychiatric: He has a normal mood and affect. His behavior is normal.  Nursing note and vitals reviewed.   ED Course  Procedures (including critical care  time) Labs Review Labs Reviewed  URINALYSIS, ROUTINE W REFLEX MICROSCOPIC - Abnormal; Notable for the following:    Hgb urine dipstick TRACE (*)    Ketones, ur TRACE (*)    All other components within normal limits  COMPREHENSIVE METABOLIC PANEL - Abnormal; Notable for the following:    Glucose, Bld 130 (*)    All other components within normal limits  CBC WITH DIFFERENTIAL - Abnormal; Notable for the following:    WBC 12.6 (*)    Lymphs Abs 4.2 (*)    All other components within normal limits  LIPASE, BLOOD - Abnormal; Notable for the following:    Lipase 73 (*)    All other components within normal limits  TROPONIN I  URINE MICROSCOPIC-ADD ON    Imaging Review Dg Chest Port 1 View  07/02/2014   CLINICAL DATA:  Atelectasis  EXAM: PORTABLE CHEST - 1 VIEW  COMPARISON:  Chest radiograph 07/01/2014  FINDINGS: Patient has been extubated and the nasogastric tube is been removed. Right IJ central venous catheter terminates in the proximal superior vena cava. Left chest tube is directed medially in the upper left hemithorax. There are changes of median sternotomy for CABG.  Stable heart and mediastinal contours. Lung volumes are slightly low. There is volume loss at the left lung base. Negative for pulmonary edema. No definite pleural effusion. No visible pneumothorax. No subcutaneous emphysema identified. Right lung is clear. No acute bony abnormality identified. Diffuse surgical clips project over the left upper quadrant.  IMPRESSION: Left basilar atelectasis.  Left chest tube placed.  No visible pneumothorax.   Electronically Signed   By: Britta Mccreedy M.D.   On: 07/02/2014 14:34   Dg Chest Port 1 View  07/01/2014   CLINICAL DATA:  Diaphragmatic hernia repair today  EXAM: PORTABLE CHEST - 1 VIEW  COMPARISON:  06/30/2014  FINDINGS: Improved aeration at the left lung base with decreased left pleural fluid. Endotracheal and nasogastric tubes are appropriately positioned. Lucency at the cardiac  apex is most likely postoperative, likely trace basilar pneumothorax or subdiaphragmatic gas. Right IJ central line tip terminates over the mid SVC.  IMPRESSION: Trace basilar gas noted on the left likely postoperative due to diaphragmatic hernia repair.   Electronically Signed   By: Christiana Pellant M.D.   On: 07/01/2014 17:04     EKG Interpretation   Date/Time:  Tuesday June 28 2014 01:41:45 EST Ventricular Rate:  66 PR Interval:  179 QRS Duration: 105 QT Interval:  412 QTC Calculation: 432 R Axis:   -76 Text Interpretation:  Sinus rhythm Left anterior fascicular block Anterior  infarct, old Confirmed by Jaymeson Mengel  MD, Burk Hoctor ([id number]) on 06/28/2014 1:45:44 AM      MDM   Final diagnoses:  Abdominal pain    CT scan shows a diaphragmatic hernia in the left which contains bowel but no obstruction. Patient is hemodynamically stable. He will be evaluated by general surgery in several hours. Discharge home  with pain medication.  Discussed with patient and his wife.    Donnetta HutchingBrian Dawnelle Warman, MD 07/02/14 [phone]

## 2014-07-02 NOTE — Progress Notes (Signed)
TCTS BRIEF SICU PROGRESS NOTE  1 Day Post-Op  S/P Procedure(s) (LRB): with repair left diaphragmatic hernia  (Left) VIDEO BRONCHOSCOPY (N/A)   Stable day NSR w/ stable BP O2 sats 97% on 1 L/min UOP 50-100 mL/hr Repeat creatinine 3.0  Plan: Continue foley and watch UOP closely.  Continue IV fluids.  OWEN,CLARENCE H 07/02/2014 8:01 PM

## 2014-07-02 NOTE — Op Note (Signed)
NAMMelton Rios:  Mark Rios, Mark Rios               ACCOUNT NO.:  [id number]  MEDICAL RECORD NO.:  [id number]  LOCATION:  2S05C                        FACILITY:  [facility]  PHYSICIAN:  Salvatore DecentSteven C. Dorris FetchHendrickson, M.D.DATE OF BIRTH:  05/27/1951  DATE OF PROCEDURE:  07/01/2014 DATE OF DISCHARGE:                              OPERATIVE REPORT   PREOPERATIVE DIAGNOSIS:  Left diaphragmatic hernia.  POSTOPERATIVE DIAGNOSIS:  Left diaphragmatic hernia.  PROCEDURE:   Left thoracotomy Repair of diaphragmatic hernia with Gore-Tex patch On-Q local anesthetic catheter placement Bronchoscopy.  SURGEON:  Salvatore DecentSteven C. Dorris FetchHendrickson, M.D.  ASSISTANT:  Rowe ClackWayne E. Gold, PA-C.  ANESTHESIA:  General.  FINDINGS:  Rupture of the central tendon of the left hemidiaphragm with extensive herniation of colon and omentum into the chest.  Majority of omentum resected, colon reduced.  Patch repair with a Gore-Tex patch. Failure of left lung to reinflate due to mucus plugging.  Bronchoscopy was performed.  No endobronchial lesions, but extensive mucus plugging on the left and moderate mucus plugging on the right.  CLINICAL NOTE:  Mark Rios is a 63 year old gentleman, who has a known left diaphragmatic hernia, but refused to have it repaired previously.  He presented with abdominal pain and bloating.  A CT scan showed the hernia present and colon through the diaphragmatic tear into the chest with a partial bowel obstruction.  The patient was advised to undergo surgical repair.  The plan was to assess with video-assisted thoracoscopy, but the patient was advised that he would likely need a thoracotomy to repair the hernia.  The indications, risks, benefits, and alternatives were discussed in detail with the patient.  He understood and accepted the risks as outlined in the chart and agreed to proceed.  OPERATIVE NOTE:  Mark Rios was brought to the operating room on July 01, 2014.  He was anesthetized and intubated with a  double-lumen endotracheal tube.  A Foley catheter was placed.  Intravenous antibiotics were administered.  Sequential compressive devices were in place for DVT prophylaxis.  He was placed in a right lateral decubitus position and the left chest was prepped and draped in the usual sterile fashion.  Single lung ventilation of the right lung was initiated and was tolerated well throughout the procedure.  An incision was made in approximately the fourth intercostal space in the midaxillary line.  This was a small stab-type incision.  A 5 mm port was placed into the chest.  The thoracoscope was advanced into the chest.  There were adhesions of the upper lobe anteriorly and superiorly.  The lower lobe was essentially free of adhesions except for some minimal adhesions posteriorly.  There was a large amount of omentum present within the chest.  A small utility incision was made in the sixth interspace anterolaterally and then an initial inspection was made without rib spreading.  It was apparent due to the size of the viscera and omentum in the chest cavity that a thoracotomy would be necessary.  The incision was lengthened.  The inferior rib was cut and the retractor was placed.  The diaphragmatic tear was obscured by the size of the herniated omentum and viscera.  An incision was made in the central portion of  the diaphragm away from the defect and then the diaphragm was divided up to the defect.  Care was taken with division of the last portion which was in close proximity to the omentum and bowel. Adhesions were taken down anteriorly, medially and posteriorly, though the more lateral adhesions and the lateral portion of the hernia sac could not be taken down at this point.  Because of the large mass of omentum, this was taken out piecemeal and portions of the omentum were removed after ensuring that there was no retained viscera.  Once the colon had been mobilized, the remainder of the  omentum was resected and the colon was placed back into an intra-abdominal location.  The lateral portion of the sac then was divided and the diaphragmatic edges were clearly visible.  It was a rather large defect and it was felt that a patch would be necessary.  A Gore-Tex patch was used for the defect. The area that had been incised linearly was closed with #1 Prolene figure-of- eight sutures.  The Gore-Tex patch then was sewn into place over the remainder of the defect with a running 0 Vicryl suture.  Care was taken to protect the viscera during suture placement.  At this point, a separate stab incision was made and a 32-French Blake drain was placed posteriorly.  However, when attempting to reinflate the left lung, there was essentially no reinflation in either the upper or lower lobes.  Attempts to suction via the endotracheal tube and attempts to manipulate the tube were unsuccessful in ventilating this portion of the lung. It was felt there was likely mucus plugs, so the plan was made to do a bronchoscopy on the patient after the chest was closed.  The ribs were reapproximated with #2 Vicryl pericostal sutures.  The muscular fascia was closed with a running #1 Vicryl suture.  The subcutaneous tissue and skin were closed in standard fashion.  A 3-0 Vicryl subcuticular suture was used to close the other port incision.  The chest tube was secured in place with a #1 silk suture.  The chest tube was placed to suction.  The patient then was placed back into a supine position.  The Anesthesia Service reintubated the patient with a single-lumen endotracheal tube.  Flexible fiberoptic bronchoscopy was performed.  There were thick secretions and some plugs present on the right side.  On the left side, there were thick tenacious clear secretions that were totally occluding the bifurcation of the left main stem bronchus.  These were suctioned and irrigated with saline until clear.  Breath  sounds were present on both sides after clearing secretions.  The patient then was transported from the operating room to the Surgical Intensive Care Unit intubated and in good condition.     Salvatore DecentSteven C. Dorris FetchHendrickson, M.D.     SCH/MEDQ  D:  07/01/2014  T:  07/02/2014  Job:  [id number]

## 2014-07-02 NOTE — Anesthesia Postprocedure Evaluation (Signed)
  Anesthesia Post-op Note  Patient: Mark Rios  Procedure(s) Performed: Procedure(s): with repair left diaphragmatic hernia  (Left) VIDEO BRONCHOSCOPY (N/A)  Patient Location: ICU  Anesthesia Type:General  Level of Consciousness: sedated  Airway and Oxygen Therapy: Patient remains intubated per anesthesia plan  Post-op Pain: none  Post-op Assessment: Post-op Vital signs reviewed, Patient's Cardiovascular Status Stable and Respiratory Function Stable  Post-op Vital Signs: Reviewed and stable  Last Vitals:  Filed Vitals:   07/02/14 0700  BP: 126/84  Pulse: 107  Temp:   Resp: 24    Complications: No apparent anesthesia complications. Pt remains intubated due to poor respiratory efforts at end of case.  Wheeze is also heard bilaterally.

## 2014-07-02 NOTE — Progress Notes (Addendum)
      [address]       [place] [id number]             [phone]        CARDIOTHORACIC SURGERY PROGRESS NOTE   R1 Day Post-Op Procedure(s) (LRB): with repair left diaphragmatic hernia  (Left) VIDEO BRONCHOSCOPY (N/A)  Subjective: Feels okay.  Minimal soreness.  No SOB.  No nausea.  Objective: Vital signs: BP Readings from Last 1 Encounters:  07/02/14 132/69   Pulse Readings from Last 1 Encounters:  07/02/14 96   Resp Readings from Last 1 Encounters:  07/02/14 23   Temp Readings from Last 1 Encounters:  07/02/14 97.8 F (36.6 C) Oral    Hemodynamics:    Physical Exam:  Rhythm:   sinus  Breath sounds: clear  Heart sounds:  RRR  Incisions:  Dressings dry, intact  Abdomen:  Soft, non-distended, non-tender, + few bowel sounds  Extremities:  Warm, well-perfused  Chest tube:  Low volume serosanguinous output, no air leak    Intake/Output from previous day: 11/13 0701 - 11/14 0700 In: 4510 [I.V.:3800; NG/GT:60; IV Piggyback:650] Out: 2255 [Urine:1255; Emesis/NG output:200; Blood:400; Chest Tube:400] Intake/Output this shift: Total I/O In: 435 [I.V.:375; NG/GT:60] Out: 230 [Urine:230]  Lab Results:  CBC: Recent Labs  06/30/14 0509 07/02/14 0430  WBC 14.9* 15.4*  HGB 16.3 14.0  HCT 46.3 40.7  PLT 191 184    BMET:  Recent Labs  06/30/14 0509 07/02/14 0430  NA 134* 136*  K 3.5* 4.3  CL 94* 100  CO2 24 23  GLUCOSE 114* 154*  BUN 12 26*  CREATININE 0.68 2.43*  CALCIUM 8.9 8.1*     CBG (last 3)   Recent Labs  07/01/14 2027 07/02/14 0401 07/02/14 0931  GLUCAP 212* 153* 128*    ABG    Component Value Date/Time   PHART 7.385 07/02/2014 0430   PCO2ART 40.9 07/02/2014 0430   PO2ART 80.0 07/02/2014 0430   HCO3 24.6* 07/02/2014 0430   TCO2 26 07/02/2014 0430   ACIDBASEDEF 1.0 07/02/2014 0430   O2SAT 96.0 07/02/2014 0430    CXR: Not done  Assessment/Plan: S/P Procedure(s) (LRB): with repair left diaphragmatic  hernia  (Left) VIDEO BRONCHOSCOPY (N/A)  Overall looks good POD1 Sudden increase in serum creatinine level, ? Acute renal insufficiency vs lab error - making good UOP and no hypotension - not receiving any nephrotoxic agents No CXR done this morning + bowel sounds   Will get CXR and repeat BMET panel  D/C a-line  Mobilize  D/C NG tube but keep NPO for now  Lovenox for DVT prophylaxis  OWEN,CLARENCE H 07/02/2014 11:14 AM

## 2014-07-03 ENCOUNTER — Inpatient Hospital Stay (HOSPITAL_COMMUNITY): Payer: Medicare Other

## 2014-07-03 LAB — GLUCOSE, CAPILLARY
Glucose-Capillary: 100 mg/dL — ABNORMAL HIGH (ref 70–99)
Glucose-Capillary: 109 mg/dL — ABNORMAL HIGH (ref 70–99)
Glucose-Capillary: 124 mg/dL — ABNORMAL HIGH (ref 70–99)
Glucose-Capillary: 128 mg/dL — ABNORMAL HIGH (ref 70–99)
Glucose-Capillary: 131 mg/dL — ABNORMAL HIGH (ref 70–99)
Glucose-Capillary: 132 mg/dL — ABNORMAL HIGH (ref 70–99)
Glucose-Capillary: 149 mg/dL — ABNORMAL HIGH (ref 70–99)
Glucose-Capillary: 153 mg/dL — ABNORMAL HIGH (ref 70–99)
Glucose-Capillary: 162 mg/dL — ABNORMAL HIGH (ref 70–99)
Glucose-Capillary: 68 mg/dL — ABNORMAL LOW (ref 70–99)

## 2014-07-03 LAB — COMPREHENSIVE METABOLIC PANEL
ALK PHOS: 44 U/L (ref 39–117)
ALT: 13 U/L (ref 0–53)
AST: 25 U/L (ref 0–37)
Albumin: 2.4 g/dL — ABNORMAL LOW (ref 3.5–5.2)
Anion gap: 13 (ref 5–15)
BUN: 36 mg/dL — ABNORMAL HIGH (ref 6–23)
CALCIUM: 7.6 mg/dL — AB (ref 8.4–10.5)
CO2: 22 meq/L (ref 19–32)
Chloride: 105 mEq/L (ref 96–112)
Creatinine, Ser: 3.88 mg/dL — ABNORMAL HIGH (ref 0.50–1.35)
GFR calc Af Amer: 18 mL/min — ABNORMAL LOW (ref >90)
GFR calc non Af Amer: 15 mL/min — ABNORMAL LOW (ref >90)
Glucose, Bld: 138 mg/dL — ABNORMAL HIGH (ref 70–99)
Potassium: 4.3 mEq/L (ref 3.7–5.3)
SODIUM: 140 meq/L (ref 137–147)
TOTAL PROTEIN: 5.5 g/dL — AB (ref 6.0–8.3)
Total Bilirubin: 1.2 mg/dL (ref 0.3–1.2)

## 2014-07-03 LAB — CBC
HCT: 36.8 % — ABNORMAL LOW (ref 39.0–52.0)
HEMOGLOBIN: 12.6 g/dL — AB (ref 13.0–17.0)
MCH: 32 pg (ref 26.0–34.0)
MCHC: 34.2 g/dL (ref 30.0–36.0)
MCV: 93.4 fL (ref 78.0–100.0)
PLATELETS: 203 10*3/uL (ref 150–400)
RBC: 3.94 MIL/uL — AB (ref 4.22–5.81)
RDW: 14.6 % (ref 11.5–15.5)
WBC: 14.1 10*3/uL — ABNORMAL HIGH (ref 4.0–10.5)

## 2014-07-03 LAB — URINE MICROSCOPIC-ADD ON

## 2014-07-03 LAB — URINALYSIS, ROUTINE W REFLEX MICROSCOPIC
BILIRUBIN URINE: NEGATIVE
GLUCOSE, UA: NEGATIVE mg/dL
KETONES UR: NEGATIVE mg/dL
Leukocytes, UA: NEGATIVE
Nitrite: NEGATIVE
PROTEIN: 100 mg/dL — AB
Specific Gravity, Urine: 1.011 (ref 1.005–1.030)
UROBILINOGEN UA: 1 mg/dL (ref 0.0–1.0)
pH: 5.5 (ref 5.0–8.0)

## 2014-07-03 LAB — SODIUM, URINE, RANDOM: Sodium, Ur: 46 mEq/L

## 2014-07-03 MED ORDER — PIPERACILLIN-TAZOBACTAM IN DEX 2-0.25 GM/50ML IV SOLN
2.2500 g | Freq: Three times a day (TID) | INTRAVENOUS | Status: DC
  Administered 2014-07-03 – 2014-07-04 (×3): 2.25 g via INTRAVENOUS
  Filled 2014-07-03 (×5): qty 50

## 2014-07-03 NOTE — Plan of Care (Signed)
Problem: Phase I Progression Outcomes Goal: Pain controlled with appropriate interventions Outcome: Progressing Full dose fentanyl PCA

## 2014-07-03 NOTE — Progress Notes (Signed)
TCTS BRIEF SICU PROGRESS NOTE  2 Days Post-Op  S/P Procedure(s) (LRB): with repair left diaphragmatic hernia  (Left) VIDEO BRONCHOSCOPY (N/A)   Looks good and feels better Abdomen soft, non tender UOP adequate  Plan: Continue current plan  OWEN,CLARENCE H 07/03/2014 5:58 PM

## 2014-07-03 NOTE — Progress Notes (Signed)
Hypoglycemic Event  CBG: 68  Treatment: 15 GM carbohydrate snack  Symptoms: None  Follow-up CBG: Time:2100 CBG Result:131  Possible Reasons for Event: Inadequate meal intake  Comments/MD notified:NA    Mark Rios, Mark Rios  Remember to initiate Hypoglycemia Order Set & complete

## 2014-07-03 NOTE — Plan of Care (Signed)
Problem: Phase I Progression Outcomes Goal: OOB as tolerated unless otherwise ordered Outcome: Completed/Met Date Met:  07/03/14 Ambulating in hallway

## 2014-07-03 NOTE — Plan of Care (Signed)
Problem: Phase II Progression Outcomes Goal: Vital signs stable Outcome: Progressing

## 2014-07-03 NOTE — Plan of Care (Signed)
Problem: Phase II Progression Outcomes Goal: Return of bowel function (flatus, BM) IF ABDOMINAL SURGERY:  Outcome: Progressing Passing flatus     

## 2014-07-03 NOTE — Plan of Care (Signed)
Problem: Phase I Progression Outcomes Goal: Voiding-avoid urinary catheter unless indicated Outcome: Not Progressing Elevated serum creatinine

## 2014-07-03 NOTE — Progress Notes (Addendum)
      [address]       [place] [id number]             [phone]        CARDIOTHORACIC SURGERY PROGRESS NOTE   R2 Days Post-Op Procedure(s) (LRB): with repair left diaphragmatic hernia  (Left) VIDEO BRONCHOSCOPY (N/A)  Subjective: Feels okay.  Mild soreness in chest.   Objective: Vital signs: BP Readings from Last 1 Encounters:  07/03/14 135/76   Pulse Readings from Last 1 Encounters:  07/03/14 91   Resp Readings from Last 1 Encounters:  07/03/14 24   Temp Readings from Last 1 Encounters:  07/03/14 97.5 F (36.4 C) Axillary    Hemodynamics:    Physical Exam:  Rhythm:   sinus  Breath sounds: clear  Heart sounds:  RRR  Incisions:  Clean and dry  Abdomen:  Soft, mildly distended, mildly tender, few BS's  Extremities:  Warm, well-perfused    Intake/Output from previous day: 11/14 0701 - 11/15 0700 In: 3297.5 [I.V.:3000; NG/GT:60; IV Piggyback:237.5] Out: 1585 [Urine:1435; Chest Tube:150] Intake/Output this shift: Total I/O In: 500 [I.V.:500] Out: 460 [Urine:430; Chest Tube:30]  Lab Results:  CBC: Recent Labs  07/02/14 0430 07/03/14 0400  WBC 15.4* 14.1*  HGB 14.0 12.6*  HCT 40.7 36.8*  PLT 184 203    BMET:  Recent Labs  07/02/14 1120 07/03/14 0400  NA 134* 140  K 4.2 4.3  CL 99 105  CO2 22 22  GLUCOSE 154* 138*  BUN 29* 36*  CREATININE 3.04* 3.88*  CALCIUM 7.9* 7.6*     CBG (last 3)   Recent Labs  07/03/14 0008 07/03/14 0411 07/03/14 0901  GLUCAP 162* 149* 128*    ABG    Component Value Date/Time   PHART 7.385 07/02/2014 0430   PCO2ART 40.9 07/02/2014 0430   PO2ART 80.0 07/02/2014 0430   HCO3 24.6* 07/02/2014 0430   TCO2 26 07/02/2014 0430   ACIDBASEDEF 1.0 07/02/2014 0430   O2SAT 96.0 07/02/2014 0430    CXR: PORTABLE CHEST - 1 VIEW  COMPARISON: 07/02/2014 and earlier priors  FINDINGS: Status post median sternotomy for CABG. Stable cardiomegaly. Right IJ central venous catheter and  left-sided chest tube replaced. Pulmonary vascularity appears within normal limits. There is streaky atelectasis at the lung bases. Negative for pneumothorax. No visible pleural effusion.  There is a slight lead displaced acute appearing fracture of the lateral arc of the left seventh rib.  IMPRESSION: 1. Acute left seventh rib fracture. 2. Left chest tube in place, without pneumothorax. 3. Mild bibasilar atelectasis, without significant change. These results will be called to the ordering clinician or representative by the Radiologist Assistant, and communication documented in the PACS or zVision Dashboard.   Electronically Signed  By: Britta MccreedySusan Turner M.D.  On: 07/03/2014 09:37   Assessment/Plan: S/P Procedure(s) (LRB): with repair left diaphragmatic hernia  (Left) VIDEO BRONCHOSCOPY (N/A)  Overall stable POD2 Acute non-oliguric renal failure, etiology unclear Maintaining NSR w/ stable BP, somewhat hypertensive Maintaining good UOP, normal potassium level Expected post-op ileus, few BS's on exam Leukocytosis resolving   Continue IV fluids  Continue Foley catheter to monitor UOP  Check UA and spot urine sodium level - consider Nephrology consult if trend continues  Continue IV antibiotics  Keep NPO except sips H2O for now  Mobilize  D/C chest tube  Mark Rios 07/03/2014 12:16 PM

## 2014-07-03 NOTE — Plan of Care (Signed)
Problem: Phase II Progression Outcomes Goal: Pain controlled Outcome: Progressing                  

## 2014-07-04 ENCOUNTER — Encounter (HOSPITAL_COMMUNITY): Payer: Self-pay | Admitting: Thoracic Surgery (Cardiothoracic Vascular Surgery)

## 2014-07-04 ENCOUNTER — Inpatient Hospital Stay (HOSPITAL_COMMUNITY): Payer: Medicare Other

## 2014-07-04 LAB — COMPREHENSIVE METABOLIC PANEL
ALK PHOS: 45 U/L (ref 39–117)
ALT: 14 U/L (ref 0–53)
AST: 19 U/L (ref 0–37)
Albumin: 2.3 g/dL — ABNORMAL LOW (ref 3.5–5.2)
Anion gap: 13 (ref 5–15)
BILIRUBIN TOTAL: 1.2 mg/dL (ref 0.3–1.2)
BUN: 38 mg/dL — ABNORMAL HIGH (ref 6–23)
CO2: 22 meq/L (ref 19–32)
Calcium: 8 mg/dL — ABNORMAL LOW (ref 8.4–10.5)
Chloride: 107 mEq/L (ref 96–112)
Creatinine, Ser: 4.05 mg/dL — ABNORMAL HIGH (ref 0.50–1.35)
GFR, EST AFRICAN AMERICAN: 17 mL/min — AB (ref >90)
GFR, EST NON AFRICAN AMERICAN: 15 mL/min — AB (ref >90)
GLUCOSE: 127 mg/dL — AB (ref 70–99)
POTASSIUM: 3.8 meq/L (ref 3.7–5.3)
SODIUM: 142 meq/L (ref 137–147)
Total Protein: 5.4 g/dL — ABNORMAL LOW (ref 6.0–8.3)

## 2014-07-04 LAB — GLUCOSE, CAPILLARY
Glucose-Capillary: 126 mg/dL — ABNORMAL HIGH (ref 70–99)
Glucose-Capillary: 123 mg/dL — ABNORMAL HIGH (ref 70–99)
Glucose-Capillary: 107 mg/dL — ABNORMAL HIGH (ref 70–99)
Glucose-Capillary: 134 mg/dL — ABNORMAL HIGH (ref 70–99)
Glucose-Capillary: 96 mg/dL (ref 70–99)
Glucose-Capillary: 143 mg/dL — ABNORMAL HIGH (ref 70–99)

## 2014-07-04 LAB — CBC
HCT: 35.1 % — ABNORMAL LOW (ref 39.0–52.0)
Hemoglobin: 11.9 g/dL — ABNORMAL LOW (ref 13.0–17.0)
MCH: 31.8 pg (ref 26.0–34.0)
MCHC: 33.9 g/dL (ref 30.0–36.0)
MCV: 93.9 fL (ref 78.0–100.0)
Platelets: 202 10*3/uL (ref 150–400)
RBC: 3.74 MIL/uL — AB (ref 4.22–5.81)
RDW: 14.9 % (ref 11.5–15.5)
WBC: 10.8 10*3/uL — ABNORMAL HIGH (ref 4.0–10.5)

## 2014-07-04 LAB — BODY FLUID CULTURE: Culture: NO GROWTH

## 2014-07-04 NOTE — Plan of Care (Signed)
Problem: Phase II Progression Outcomes Goal: Pain controlled Outcome: Completed/Met Date Met:  07/04/14 Goal: Progressing with IS, TCDB Outcome: Completed/Met Date Met:  07/04/14 Goal: Vital signs stable Outcome: Completed/Met Date Met:  07/04/14 Goal: Dressings dry/intact Outcome: Completed/Met Date Met:  07/04/14

## 2014-07-04 NOTE — Progress Notes (Signed)
3 Days Post-Op Procedure(s) (LRB): with repair left diaphragmatic hernia  (Left) VIDEO BRONCHOSCOPY (N/A) Subjective: Multiple BM overnight Denies nausea, is hungry  Objective: Vital signs in last 24 hours: Temp:  [97.5 F (36.4 C)-98.1 F (36.7 C)] 98.1 F (36.7 C) (11/16 0732) Pulse Rate:  [70-97] 82 (11/16 0700) Cardiac Rhythm:  [-] Normal sinus rhythm (11/16 0400) Resp:  [15-24] 20 (11/16 0700) BP: (129-158)/(67-85) 157/75 mmHg (11/16 0600) SpO2:  [93 %-98 %] 98 % (11/16 0700) Weight:  [159 lb 6.3 oz (72.3 kg)] 159 lb 6.3 oz (72.3 kg) (11/16 0600)  Hemodynamic parameters for last 24 hours:    Intake/Output from previous day: 11/15 0701 - 11/16 0700 In: 3026 [I.V.:2876; IV Piggyback:150] Out: 1725 [Urine:1655; Chest Tube:70] Intake/Output this shift:    General appearance: alert and no distress Neurologic: intact Heart: regular rate and rhythm Lungs: diminished breath sounds bibasilar Abdomen: + BS, nondistended  Lab Results:  Recent Labs  07/03/14 0400 07/04/14 0400  WBC 14.1* 10.8*  HGB 12.6* 11.9*  HCT 36.8* 35.1*  PLT 203 202   BMET:  Recent Labs  07/03/14 0400 07/04/14 0400  NA 140 142  K 4.3 3.8  CL 105 107  CO2 22 22  GLUCOSE 138* 127*  BUN 36* 38*  CREATININE 3.88* 4.05*  CALCIUM 7.6* 8.0*    PT/INR: No results for input(s): LABPROT, INR in the last 72 hours. ABG    Component Value Date/Time   PHART 7.385 07/02/2014 0430   HCO3 24.6* 07/02/2014 0430   TCO2 26 07/02/2014 0430   ACIDBASEDEF 1.0 07/02/2014 0430   O2SAT 96.0 07/02/2014 0430   CBG (last 3)   Recent Labs  07/03/14 2028 07/03/14 2113 07/03/14 2325  GLUCAP 68* 131* 126*    Assessment/Plan: S/P Procedure(s) (LRB): with repair left diaphragmatic hernia  (Left) VIDEO BRONCHOSCOPY (N/A) -  CV- stable  RESP- bibasilar atelectasis- continue IS  RENAL- nonoliguric acute renal failure- creatinine up again this AM, but appears to be leveling off. Keep foley. Continue  to monitor. Dc zosyn  Enoxaparin + SCD for DVT prophylaxis  GI- ileus- clinically resolving- + flatus and multiple BM- start clears, decrease IVF  Transfer to 3S when bed available    LOS: 5 days    Shiloh Swopes C 07/04/2014

## 2014-07-04 NOTE — Progress Notes (Signed)
CT surgery p.m. Rounds  Sitting up in chair, comfortable No complaints of postthoracotomy pain Ambulating well Tolerating a liquid diet, positive bowel movement Creatinine rising postop now 4.0 with stable hematocrit, potassium 3.8 Doing well after repair left diaphragmatic traumatic hernia

## 2014-07-05 ENCOUNTER — Inpatient Hospital Stay (HOSPITAL_COMMUNITY): Payer: Medicare Other

## 2014-07-05 LAB — BASIC METABOLIC PANEL
Anion gap: 11 (ref 5–15)
BUN: 31 mg/dL — ABNORMAL HIGH (ref 6–23)
CALCIUM: 8.1 mg/dL — AB (ref 8.4–10.5)
CO2: 22 mEq/L (ref 19–32)
Chloride: 108 mEq/L (ref 96–112)
Creatinine, Ser: 3.29 mg/dL — ABNORMAL HIGH (ref 0.50–1.35)
GFR calc non Af Amer: 19 mL/min — ABNORMAL LOW (ref >90)
GFR, EST AFRICAN AMERICAN: 22 mL/min — AB (ref >90)
Glucose, Bld: 111 mg/dL — ABNORMAL HIGH (ref 70–99)
POTASSIUM: 3.6 meq/L — AB (ref 3.7–5.3)
SODIUM: 141 meq/L (ref 137–147)

## 2014-07-05 LAB — GLUCOSE, CAPILLARY
Glucose-Capillary: 80 mg/dL (ref 70–99)
Glucose-Capillary: 105 mg/dL — ABNORMAL HIGH (ref 70–99)
Glucose-Capillary: 115 mg/dL — ABNORMAL HIGH (ref 70–99)
Glucose-Capillary: 103 mg/dL — ABNORMAL HIGH (ref 70–99)

## 2014-07-05 LAB — CBC
HCT: 34.7 % — ABNORMAL LOW (ref 39.0–52.0)
Hemoglobin: 11.5 g/dL — ABNORMAL LOW (ref 13.0–17.0)
MCH: 30.3 pg (ref 26.0–34.0)
MCHC: 33.1 g/dL (ref 30.0–36.0)
MCV: 91.3 fL (ref 78.0–100.0)
Platelets: 226 10*3/uL (ref 150–400)
RBC: 3.8 MIL/uL — AB (ref 4.22–5.81)
RDW: 14.9 % (ref 11.5–15.5)
WBC: 8.7 10*3/uL (ref 4.0–10.5)

## 2014-07-05 MED ORDER — PROMETHAZINE HCL 25 MG PO TABS: 25.0000 mg | ORAL_TABLET | Freq: Four times a day (QID) | ORAL | Status: DC | PRN

## 2014-07-05 MED ORDER — CARVEDILOL 6.25 MG PO TABS
18.2500 mg | ORAL_TABLET | Freq: Two times a day (BID) | ORAL | Status: DC
  Administered 2014-07-05 – 2014-07-07 (×5): 18.75 mg via ORAL
  Filled 2014-07-05 (×7): qty 1

## 2014-07-05 MED ORDER — METFORMIN HCL 500 MG PO TABS: 500.0000 mg | ORAL_TABLET | Freq: Two times a day (BID) | ORAL | Status: DC

## 2014-07-05 MED ORDER — SIMVASTATIN 40 MG PO TABS
40.0000 mg | ORAL_TABLET | Freq: Every evening | ORAL | Status: DC
  Administered 2014-07-05 – 2014-07-06 (×2): 40 mg via ORAL
  Filled 2014-07-05 (×3): qty 1

## 2014-07-05 NOTE — Progress Notes (Signed)
Wasted 34mL of remaining PCA Fentanyl syringe down sink with Acey LavPhyllis McKinney, RN. Franki CabotBrianna Anastasio Wogan, RN

## 2014-07-05 NOTE — Plan of Care (Signed)
Problem: Consults Goal: Diabetes Guidelines if Diabetic/Glucose > 140 If diabetic or lab glucose is > 140 mg/dl - Initiate Diabetes/Hyperglycemia Guidelines & Document Interventions  Outcome: Not Applicable Date Met:  08/56/94

## 2014-07-05 NOTE — Plan of Care (Signed)
Problem: Consults Goal: General Surgical Patient Education (See Patient Education module for education specifics)  Outcome: Completed/Met Date Met:  07/05/14     

## 2014-07-05 NOTE — Progress Notes (Signed)
4 Days Post-Op Procedure(s) (LRB): with repair left diaphragmatic hernia  (Left) VIDEO BRONCHOSCOPY (N/A) Subjective: No complaints this AM Denies pain and nausea Tolerated clears  Objective: Vital signs in last 24 hours: Temp:  [97.5 F (36.4 C)-98.5 F (36.9 C)] 98.5 F (36.9 C) (11/17 0300) Pulse Rate:  [64-84] 69 (11/17 0600) Cardiac Rhythm:  [-] Normal sinus rhythm (11/17 0730) Resp:  [13-21] 19 (11/17 0600) BP: (138-167)/(61-92) 166/88 mmHg (11/17 0700) SpO2:  [94 %-100 %] 97 % (11/17 0818) FiO2 (%):  [21 %] 21 % (11/17 0700)  Hemodynamic parameters for last 24 hours:    Intake/Output from previous day: 11/16 0701 - 11/17 0700 In: 2079.2 [P.O.:240; I.V.:1839.2] Out: 2586 [Urine:2585; Stool:1] Intake/Output this shift:    General appearance: alert and no distress Neurologic: intact Heart: regular rate and rhythm Lungs: diminished breath sounds bibasilar Abdomen: normal findings: soft, non-tender  Lab Results:  Recent Labs  07/04/14 0400 07/05/14 0425  WBC 10.8* 8.7  HGB 11.9* 11.5*  HCT 35.1* 34.7*  PLT 202 226   BMET:  Recent Labs  07/04/14 0400 07/05/14 0425  NA 142 141  K 3.8 3.6*  CL 107 108  CO2 22 22  GLUCOSE 127* 111*  BUN 38* 31*  CREATININE 4.05* 3.29*  CALCIUM 8.0* 8.1*    PT/INR: No results for input(s): LABPROT, INR in the last 72 hours. ABG    Component Value Date/Time   PHART 7.385 07/02/2014 0430   HCO3 24.6* 07/02/2014 0430   TCO2 26 07/02/2014 0430   ACIDBASEDEF 1.0 07/02/2014 0430   O2SAT 96.0 07/02/2014 0430   CBG (last 3)   Recent Labs  07/04/14 2008 07/04/14 2316 07/05/14 0340  GLUCAP 143* 80 105*    Assessment/Plan: S/P Procedure(s) (LRB): with repair left diaphragmatic hernia  (Left) VIDEO BRONCHOSCOPY (N/A) Plan for transfer to step-down: see transfer orders   CV- hypertensive- increase coreg to home dose  Restart lisinopril prior to dc  RESP_ basilar atelectasis- continue IS  Nebs PRN  RENAL-  creatinine down today, good UO  Lytes OK  Dc foley, dc IVF, recheck BMET in AM  GI -ileus resolved, advance diet  Continue ambulation  On enoxaparin for DVT prophylaxis  transfer to 2W   LOS: 6 days    Miquan Tandon C 07/05/2014
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
## 2014-07-05 NOTE — Plan of Care (Signed)
Problem: Phase I Progression Outcomes Goal: Pain controlled with appropriate interventions Outcome: Completed/Met Date Met:  07/05/14

## 2014-07-05 NOTE — Plan of Care (Signed)
Problem: Consults Goal: Skin Care Protocol Initiated - if Braden Score 18 or less If consults are not indicated, leave blank or document N/A Outcome: Completed/Met Date Met:  07/05/14     

## 2014-07-05 NOTE — Plan of Care (Signed)
Problem: Consults Goal: Nutrition Consult-if indicated Outcome: Not Applicable Date Met:  13/68/59

## 2014-07-05 NOTE — Progress Notes (Signed)
Attempted to call report to [id number] RN. Unable to receive report, gave number to Diplomatic Services operational officersecretary. Franki CabotBrianna Trystyn Sitts, RN [phone]

## 2014-07-06 LAB — BASIC METABOLIC PANEL
Anion gap: 12 (ref 5–15)
BUN: 29 mg/dL — ABNORMAL HIGH (ref 6–23)
CO2: 22 mEq/L (ref 19–32)
Calcium: 8.8 mg/dL (ref 8.4–10.5)
Chloride: 106 mEq/L (ref 96–112)
Creatinine, Ser: 2.44 mg/dL — ABNORMAL HIGH (ref 0.50–1.35)
GFR, EST AFRICAN AMERICAN: 31 mL/min — AB (ref >90)
GFR, EST NON AFRICAN AMERICAN: 27 mL/min — AB (ref >90)
Glucose, Bld: 98 mg/dL (ref 70–99)
Potassium: 3.3 mEq/L — ABNORMAL LOW (ref 3.7–5.3)
SODIUM: 140 meq/L (ref 137–147)

## 2014-07-06 MED ORDER — LISINOPRIL 10 MG PO TABS
10.0000 mg | ORAL_TABLET | Freq: Every day | ORAL | Status: DC
  Administered 2014-07-06 – 2014-07-07 (×2): 10 mg via ORAL
  Filled 2014-07-06 (×2): qty 1

## 2014-07-06 MED ORDER — POTASSIUM CHLORIDE CRYS ER 20 MEQ PO TBCR
20.0000 meq | EXTENDED_RELEASE_TABLET | Freq: Once | ORAL | Status: AC
  Administered 2014-07-06: 20 meq via ORAL
  Filled 2014-07-06: qty 1

## 2014-07-06 NOTE — Plan of Care (Signed)
Problem: Phase I Progression Outcomes Goal: Voiding-avoid urinary catheter unless indicated Outcome: Completed/Met Date Met:  07/06/14     

## 2014-07-06 NOTE — Progress Notes (Addendum)
       [address]       [place] [id number]             [phone]          5 Days Post-Op Procedure(s) (LRB): with repair left diaphragmatic hernia  (Left) VIDEO BRONCHOSCOPY (N/A)  Subjective: Feels well, no complaints. Eating well, bowels working.  Wants to go home.    Objective: Vital signs in last 24 hours: Patient Vitals for the past 24 hrs:  BP Temp Temp src Pulse Resp SpO2  07/06/14 0913 - - - - - 97 %  07/06/14 0338 (!) 170/77 mmHg 98 F (36.7 C) Oral 78 18 96 %  07/05/14 2033 (!) 167/73 mmHg 98.4 F (36.9 C) Oral 79 18 100 %  07/05/14 1606 (!) 154/77 mmHg - - - - -  07/05/14 1600 (!) 170/73 mmHg 98.7 F (37.1 C) - 77 18 98 %  07/05/14 1400 (!) 171/76 mmHg - - - 17 -  07/05/14 1300 - - - 77 16 96 %  07/05/14 1200 (!) 167/79 mmHg - - 78 (!) 21 97 %  07/05/14 1100 - 98.2 F (36.8 C) Oral 84 12 97 %   Current Weight  07/04/14 159 lb 6.3 oz (72.3 kg)     Intake/Output from previous day: 11/17 0701 - 11/18 0700 In: 640 [P.O.:540; I.V.:100] Out: 851 [Urine:850; Stool:1]    PHYSICAL EXAM:  Heart: RRR Lungs: Clear Wound: Clean and dry   Lab Results: CBC: Recent Labs  07/04/14 0400 07/05/14 0425  WBC 10.8* 8.7  HGB 11.9* 11.5*  HCT 35.1* 34.7*  PLT 202 226   BMET:  Recent Labs  07/05/14 0425 07/06/14 0420  NA 141 140  K 3.6* 3.3*  CL 108 106  CO2 22 22  GLUCOSE 111* 98  BUN 31* 29*  CREATININE 3.29* 2.44*  CALCIUM 8.1* 8.8    PT/INR: No results for input(s): LABPROT, INR in the last 72 hours.    Assessment/Plan: S/P Procedure(s) (LRB): with repair left diaphragmatic hernia  (Left) VIDEO BRONCHOSCOPY (N/A) CV- BPs remain elevated, Coreg restarted, ACE-I has been on hold due to ARF, but hopefully since Cr is leveling off, this can be resumed soon. GI- ileus resolved. ARF- Cr trending down, baseline prior to admission 0.75. Continue to monitor. He is overall doing well.  Possible d/c home later today.   LOS:  7 days    COLLINS,GINA H 07/06/2014  Patient seen and examined, agree with above Will restart ACE-I for hypertension Home tomorrow if creatinine continues to decrease

## 2014-07-06 NOTE — Plan of Care (Signed)
Problem: Phase I Progression Outcomes Goal: Other Phase I Outcomes/Goals Outcome: Not Applicable Date Met:  57/49/35

## 2014-07-07 LAB — BASIC METABOLIC PANEL
ANION GAP: 15 (ref 5–15)
BUN: 25 mg/dL — ABNORMAL HIGH (ref 6–23)
CO2: 23 mEq/L (ref 19–32)
Calcium: 8.8 mg/dL (ref 8.4–10.5)
Chloride: 105 mEq/L (ref 96–112)
Creatinine, Ser: 1.78 mg/dL — ABNORMAL HIGH (ref 0.50–1.35)
GFR, EST AFRICAN AMERICAN: 45 mL/min — AB (ref >90)
GFR, EST NON AFRICAN AMERICAN: 39 mL/min — AB (ref >90)
Glucose, Bld: 94 mg/dL (ref 70–99)
Potassium: 3.6 mEq/L — ABNORMAL LOW (ref 3.7–5.3)
Sodium: 143 mEq/L (ref 137–147)

## 2014-07-07 MED ORDER — OXYCODONE-ACETAMINOPHEN 5-325 MG PO TABS: 1.0000 | ORAL_TABLET | ORAL | Status: DC | PRN

## 2014-07-07 NOTE — Plan of Care (Signed)
Problem: Phase I Progression Outcomes Goal: Initial discharge plan identified Outcome: Completed/Met Date Met:  07/07/14     

## 2014-07-07 NOTE — Plan of Care (Signed)
Problem: Phase I Progression Outcomes Goal: Sutures/staples intact Outcome: Completed/Met Date Met:  07/07/14

## 2014-07-07 NOTE — Care Management Note (Signed)
    Page 1 of 1   07/07/2014     12:37:00 PM CARE MANAGEMENT NOTE 07/07/2014  Patient:  Mark AmassMOORE,Navy S   Account Number:  [id number]  Date Initiated:  07/04/2014  Documentation initiated by:  Avie ArenasBROWN,SARAH  Subjective/Objective Assessment:   Admitted with abd pain from diaphragmatic hernia.  Post op surgery on 11-13 - ICU on vent.     Action/Plan:   Anticipated DC Date:  07/07/2014   Anticipated DC Plan:  HOME W HOME HEALTH SERVICES      DC Planning Services  CM consult      Choice offered to / List presented to:             Status of service:  Completed, signed off Medicare Important Message given?  YES (If response is "NO", the following Medicare IM given date fields will be blank) Date Medicare IM given:  07/05/2014 Medicare IM given by:  MAYO,HENRIETTA Date Additional Medicare IM given:   Additional Medicare IM given by:    Discharge Disposition:  HOME/SELF CARE  Per UR Regulation:  Reviewed for med. necessity/level of care/duration of stay  If discussed at Long Length of Stay Meetings, dates discussed:   07/07/2014    Comments:  Contact:  Preyer,Gloria Spouse [phone]  [phone]

## 2014-07-07 NOTE — Progress Notes (Signed)
       [address]       [place] [id number]             [phone]          6 Days Post-Op Procedure(s) (LRB): with repair left diaphragmatic hernia  (Left) VIDEO BRONCHOSCOPY (N/A)  Subjective: Feels well, no complaints.   Objective: Vital signs in last 24 hours: Patient Vitals for the past 24 hrs:  BP Temp Temp src Pulse Resp SpO2  07/07/14 0439 (!) 173/77 mmHg 98.9 F (37.2 C) Oral 84 18 99 %  07/06/14 2135 (!) 169/77 mmHg 98.2 F (36.8 C) Oral 77 18 100 %  07/06/14 1448 (!) 166/64 mmHg 98.4 F (36.9 C) Oral 81 18 100 %  07/06/14 0913 - - - - - 97 %   Current Weight  07/04/14 159 lb 6.3 oz (72.3 kg)     Intake/Output from previous day: 11/18 0701 - 11/19 0700 In: 840 [P.O.:840] Out: -     PHYSICAL EXAM:  Heart: RRR Lungs: Clear Wound: Clean and dry    Lab Results: CBC: Recent Labs  07/05/14 0425  WBC 8.7  HGB 11.5*  HCT 34.7*  PLT 226   BMET:  Recent Labs  07/06/14 0420 07/07/14 0400  NA 140 143  K 3.3* 3.6*  CL 106 105  CO2 22 23  GLUCOSE 98 94  BUN 29* 25*  CREATININE 2.44* 1.78*  CALCIUM 8.8 8.8    PT/INR: No results for input(s): LABPROT, INR in the last 72 hours.    Assessment/Plan: S/P Procedure(s) (LRB): with repair left diaphragmatic hernia  (Left) VIDEO BRONCHOSCOPY (N/A) ARF- Cr trending back down. Will follow up labs as outpatient. HTN- BPs improved, home meds restarted.   Plan d/c home today - instructions reviewed with patient.   LOS: 8 days    Alannie Amodio H 07/07/2014

## 2014-07-07 NOTE — Plan of Care (Signed)
Problem: Discharge Progression Outcomes Goal: Other Discharge Outcomes/Goals Outcome: Not Applicable Date Met:  51/83/43

## 2014-07-07 NOTE — Plan of Care (Signed)
Problem: Discharge Progression Outcomes Goal: Barriers To Progression Addressed/Resolved Outcome: Completed/Met Date Met:  07/07/14 Goal: Discharge plan in place and appropriate Outcome: Completed/Met Date Met:  07/07/14 Goal: Pain controlled with appropriate interventions Outcome: Completed/Met Date Met:  07/07/14 Goal: Hemodynamically stable Outcome: Completed/Met Date Met:  06/89/34 Goal: Complications resolved/controlled Outcome: Completed/Met Date Met:  07/07/14 Goal: Tolerating diet Outcome: Completed/Met Date Met:  07/07/14 Goal: Activity appropriate for discharge plan Outcome: Completed/Met Date Met:  07/07/14 Goal: Tubes and drains discontinued if indicated Outcome: Not Applicable Date Met:  06/84/03 Goal: Staples/sutures removed Outcome: Not Applicable Date Met:  35/33/17 Goal: Steri-Strips applied Outcome: Completed/Met Date Met:  07/07/14

## 2014-07-08 ENCOUNTER — Other Ambulatory Visit: Payer: Self-pay | Admitting: *Deleted

## 2014-07-08 DIAGNOSIS — N179 Acute kidney failure, unspecified: Secondary | ICD-10-CM

## 2014-07-12 ENCOUNTER — Ambulatory Visit (INDEPENDENT_AMBULATORY_CARE_PROVIDER_SITE_OTHER): Payer: Self-pay

## 2014-07-12 VITALS — Ht 66.0 in | Wt 159.0 lb

## 2014-07-12 DIAGNOSIS — K449 Diaphragmatic hernia without obstruction or gangrene: Secondary | ICD-10-CM

## 2014-07-12 DIAGNOSIS — Z4802 Encounter for removal of sutures: Secondary | ICD-10-CM

## 2014-07-12 DIAGNOSIS — R600 Localized edema: Secondary | ICD-10-CM

## 2014-07-12 LAB — BASIC METABOLIC PANEL
BUN: 16 mg/dL (ref 6–23)
CO2: 27 mEq/L (ref 19–32)
Calcium: 9.8 mg/dL (ref 8.4–10.5)
Chloride: 100 mEq/L (ref 96–112)
Creat: 1.3 mg/dL (ref 0.50–1.35)
Glucose, Bld: 96 mg/dL (ref 70–99)
Potassium: 4.3 mEq/L (ref 3.5–5.3)
SODIUM: 143 meq/L (ref 135–145)

## 2014-07-12 MED ORDER — FUROSEMIDE 20 MG PO TABS: 20.0000 mg | ORAL_TABLET | Freq: Every day | ORAL | Status: DC

## 2014-07-12 NOTE — Progress Notes (Signed)
Removed 1 suture from chest tube site, no signs of infection and pt tolerated well. Dr Dorris FetchHendrickson review BMP labs from the Am ( all normal) and check pt's feet. He sent in Rx for Lasix 20 mg po every day until next ov.Also recommend ted hose compression stockings. Pt will repeat BMP in 2 weeks prior to appt.

## 2014-07-13 ENCOUNTER — Other Ambulatory Visit: Payer: Self-pay

## 2014-07-13 DIAGNOSIS — R600 Localized edema: Secondary | ICD-10-CM

## 2014-07-25 ENCOUNTER — Other Ambulatory Visit: Payer: Self-pay | Admitting: Thoracic Surgery (Cardiothoracic Vascular Surgery)

## 2014-07-25 DIAGNOSIS — K449 Diaphragmatic hernia without obstruction or gangrene: Secondary | ICD-10-CM

## 2014-07-26 ENCOUNTER — Institutional Professional Consult (permissible substitution) (INDEPENDENT_AMBULATORY_CARE_PROVIDER_SITE_OTHER): Payer: Self-pay | Admitting: Thoracic Surgery (Cardiothoracic Vascular Surgery)

## 2014-07-26 ENCOUNTER — Encounter: Payer: Self-pay | Admitting: Thoracic Surgery (Cardiothoracic Vascular Surgery)

## 2014-07-26 ENCOUNTER — Ambulatory Visit
Admission: RE | Admit: 2014-07-26 | Discharge: 2014-07-26 | Disposition: A | Discharge status: Home / Self Care | Payer: Medicare Other | Source: Ambulatory Visit | Attending: Thoracic Surgery (Cardiothoracic Vascular Surgery) | Admitting: Thoracic Surgery (Cardiothoracic Vascular Surgery)

## 2014-07-26 VITALS — BP 120/67 | HR 82 | Resp 20 | Ht 66.0 in | Wt 159.0 lb

## 2014-07-26 DIAGNOSIS — K440 Diaphragmatic hernia with obstruction, without gangrene: Secondary | ICD-10-CM

## 2014-07-26 DIAGNOSIS — Z9889 Other specified postprocedural states: Secondary | ICD-10-CM

## 2014-07-26 DIAGNOSIS — K449 Diaphragmatic hernia without obstruction or gangrene: Secondary | ICD-10-CM

## 2014-07-26 DIAGNOSIS — IMO0001 Reserved for inherently not codable concepts without codable children: Secondary | ICD-10-CM

## 2014-07-26 DIAGNOSIS — S27809D Unspecified injury of diaphragm, subsequent encounter: Secondary | ICD-10-CM

## 2014-07-26 LAB — BASIC METABOLIC PANEL
BUN: 19 mg/dL (ref 6–23)
CO2: 30 meq/L (ref 19–32)
Calcium: 9.9 mg/dL (ref 8.4–10.5)
Chloride: 104 mEq/L (ref 96–112)
Creat: 1.13 mg/dL (ref 0.50–1.35)
GLUCOSE: 92 mg/dL (ref 70–99)
POTASSIUM: 5.4 meq/L — AB (ref 3.5–5.3)
SODIUM: 139 meq/L (ref 135–145)

## 2014-07-26 NOTE — Progress Notes (Signed)
HPI:  [name] returns for a scheduled postoperative follow-up visit.  He is a 63 year old gentleman who had a known left diaphragmatic hernia due to previous trauma. I advised him to repair this several years ago but he was not interested at that time. He presented back in early November with increasing pain and had a loop of colon essentially incarcerated in the diaphragmatic hernia. We did a left thoracotomy and repair of the diaphragmatic hernia with a Gore-Tex patch on 07/01/2014. He had an ileus initially postoperatively but then after that his recovery was unremarkable.  He says that since discharge she still has some pain and numbness so she with his incision. He has not taken any narcotics since discharge. He is eating well and having normal bowel movements. He is anxious to resume activities. He does have an inguinal hernia that he is seeing Mark Rios for.  Past Medical History  Diagnosis Date  . Abdominal hernia   . Diabetes mellitus without complication   . COPD (chronic obstructive pulmonary disease)   . Hypertension   . High cholesterol   . Stroke   . Arrhythmia   . Pneumothorax       Current Outpatient Prescriptions  Medication Sig Dispense Refill  . albuterol (PROVENTIL HFA;VENTOLIN HFA) 108 (90 BASE) MCG/ACT inhaler Inhale 2 puffs into the lungs every 6 (six) hours as needed for wheezing or shortness of breath.    Marland Kitchen. albuterol (PROVENTIL) (2.5 MG/3ML) 0.083% nebulizer solution Take 2.5 mg by nebulization every 4 (four) hours as needed for wheezing.    Marland Kitchen. aspirin 325 MG EC tablet Take 325 mg by mouth at bedtime.     . carvedilol (COREG) 12.5 MG tablet Take 18.25 mg by mouth 2 (two) times daily with a meal.     . furosemide (LASIX) 20 MG tablet Take 1 tablet (20 mg total) by mouth daily. 30 tablet 0  . ipratropium (ATROVENT) 0.02 % nebulizer solution Take 500 mcg by nebulization every 4 (four) hours as needed for wheezing.    Marland Kitchen. lisinopril (PRINIVIL,ZESTRIL) 10 MG  tablet Take 10 mg by mouth daily.    . metFORMIN (GLUCOPHAGE) 500 MG tablet Take 500 mg by mouth 2 (two) times daily with a meal.    . simvastatin (ZOCOR) 40 MG tablet Take 40 mg by mouth every evening.     No current facility-administered medications for this visit.    Physical Exam BP 120/67 mmHg  Pulse 82  Resp 20  Ht 5\' 6"  (1.676 m)  Wt 159 lb (72.122 kg)  BMI 25.68 kg/m2  [id number] 5298% 63 year old man in no acute distress Alert and oriented 3 with no focal deficits Cardiac regular rate and rhythm normal S1 and S2 Lungs clear with equal breath sound bilaterally Incision well-healed  Diagnostic Tests: CHEST 2 VIEW  COMPARISON: 07/04/2014  FINDINGS: Small residual left pleural effusion. Lung base atelectasis has resolved since the prior study. No lung consolidation or edema. No pneumothorax.  Cardiac silhouette is normal in size and configuration. 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 Normal mediastinal and hilar contours.  Bony thorax is demineralized but intact.  IMPRESSION: 1. No acute findings. 2. Small residual left pleural effusion. Basilar atelectasis noted previously has resolved. No pneumothorax.   Electronically Signed  By: Mark Rios M.D.  On: 07/26/2014 10:37  Impression: 63 year old gentleman who is about a month out from a left thoracotomy to repair an incarcerated left diaphragmatic hernia. He is recovering well at this point. It is pretty remarkable that  he is not taking any pain medication currently.  He may begin driving. Appropriate precautions were discussed. He is not lifting over 10 pounds for at least another 3 weeks.  I think he can go ahead with repair of his inguinal hernia whenever he and Mark Rios are ready to do so  Plan:  I will be happy to see [name] back any time if I can be of any further assistance with his care

## 2014-08-26 ENCOUNTER — Ambulatory Visit: Payer: Medicare Other | Admitting: Cardiovascular Disease

## 2014-08-26 NOTE — H&P (Signed)
  NTS SOAP Note  Vital Signs:  Vitals as of: 08/25/2014: Systolic 103: Diastolic 64: Heart Rate 81: Temp 27F: Height 145ft 8in: Weight 150Lbs 0 Ounces: Pain Level 4: BMI 22.81  BMI : 22.81 kg/m2  Subjective: This 64 year old male presents for of a right inguinal hernia and an umbilical hernia.  The right inguinal hernia has been present for several months,  is increasing in size and causing him discomfort.  Made worse with straining.  Umbilical hernia also bothering him.  Review of Symptoms:  Constitutional:unremarkable   Head:unremarkable Eyes:  blurred vision bilateral Nose/Mouth/Throat:unremarkable Cardiovascular:  unremarkable Respiratory:unremarkable Gastrointestinabdominal pain Genitourinary:unremarkable   Musculoskeletal:unremarkable Skin:unremarkable Hematolgic/Lymphatic:unremarkable   Allergic/Immunologic:unremarkable   Past Medical History:  Reviewed  Past Medical History  Surgical History: Cardiac cath Medical Problems: CAD,  HTN,  COPD,  NIDDM,  CVA in remote past Allergies: nkda Medications: albuterol,  carvedilol,  metformin,  simvastatin,  lisinopril,  ipratropium,  ventolin,  ASA (which he is holding)   Social History:Reviewed  Social History  Preferred Language: English Race:  White Ethnicity: Not Hispanic / Latino Age: [age] year Marital Status:  M Alcohol: no   Smoking Status: Never smoker reviewed on 08/25/2014 Functional Status reviewed on 08/25/2014 ------------------------------------------------ Bathing: Normal Cooking: Normal Dressing: Normal Driving: Normal Eating: Normal Managing Meds: Normal Oral Care: Normal Shopping: Normal Toileting: Normal Transferring: Normal Walking: Normal Cognitive Status reviewed on 08/25/2014 ------------------------------------------------ Attention: Normal Decision Making: Normal Language: Normal Memory: Normal Motor: Normal Perception: Normal Problem Solving: Normal Visual and  Spatial: Normal   Family History:Reviewed  Family Health History Family History is Unknown    Objective Information: General:Well appearing, well nourished in no distress. Heart:RRR, no murmur or gallop.  Normal S1, S2.  No S3, S4.  Lungs:  CTA bilaterally, no wheezes, rhonchi, rales.  Breathing unlabored. Abdomen:Soft, NT/ND, no HSM, no masses.  Reducible right inguinal hernia and umbilical hernia.  Weak left inguinal floor. YN:[id number]:unremarkable    Assessment:Right inguinal hernia,  umbilical hernia  Diagnoses: 550.90  K40.90 Inguinal hernia (Unilateral inguinal hernia, without obstruction or gangrene, not specified as recurrent)  Procedures: [id number] - OFFICE OUTPATIENT NEW 30 MINUTES    Plan:  Scheduled for right inguinal herniorrhaphy with mesh,  umbilical herniorrhaphy with mesh on 08/31/14.   Patient Education:Alternative treatments to surgery were discussed with patient (and family).  Risks and benefits  of procedure including bleeding,  infection,  mesh use,  and recurrence of the hernia were fully explained to the patient (and family) who gave informed consent. Patient/family questions were addressed.  Follow-up:Pending Surgery

## 2014-08-29 ENCOUNTER — Encounter (HOSPITAL_COMMUNITY): Payer: Self-pay

## 2014-08-29 ENCOUNTER — Encounter (HOSPITAL_COMMUNITY)
Admission: RE | Admit: 2014-08-29 | Discharge: 2014-08-29 | Disposition: A | Discharge status: Home / Self Care | Payer: Medicare Other | Source: Ambulatory Visit | Attending: General Surgery | Admitting: General Surgery

## 2014-08-29 DIAGNOSIS — K4090 Unilateral inguinal hernia, without obstruction or gangrene, not specified as recurrent: Secondary | ICD-10-CM | POA: Diagnosis present

## 2014-08-29 DIAGNOSIS — Z8673 Personal history of transient ischemic attack (TIA), and cerebral infarction without residual deficits: Secondary | ICD-10-CM | POA: Diagnosis not present

## 2014-08-29 DIAGNOSIS — J449 Chronic obstructive pulmonary disease, unspecified: Secondary | ICD-10-CM | POA: Diagnosis not present

## 2014-08-29 DIAGNOSIS — E119 Type 2 diabetes mellitus without complications: Secondary | ICD-10-CM | POA: Diagnosis not present

## 2014-08-29 DIAGNOSIS — I2510 Atherosclerotic heart disease of native coronary artery without angina pectoris: Secondary | ICD-10-CM | POA: Diagnosis not present

## 2014-08-29 DIAGNOSIS — I10 Essential (primary) hypertension: Secondary | ICD-10-CM | POA: Diagnosis not present

## 2014-08-29 DIAGNOSIS — K429 Umbilical hernia without obstruction or gangrene: Secondary | ICD-10-CM | POA: Diagnosis not present

## 2014-08-29 DIAGNOSIS — Z79899 Other long term (current) drug therapy: Secondary | ICD-10-CM | POA: Diagnosis not present

## 2014-08-29 LAB — BASIC METABOLIC PANEL
ANION GAP: 3 — AB (ref 5–15)
BUN: 20 mg/dL (ref 6–23)
CALCIUM: 9.8 mg/dL (ref 8.4–10.5)
CO2: 31 mmol/L (ref 19–32)
CREATININE: 1.17 mg/dL (ref 0.50–1.35)
Chloride: 107 mEq/L (ref 96–112)
GFR calc Af Amer: 75 mL/min — ABNORMAL LOW (ref >90)
GFR calc non Af Amer: 65 mL/min — ABNORMAL LOW (ref >90)
GLUCOSE: 101 mg/dL — AB (ref 70–99)
POTASSIUM: 5.2 mmol/L — AB (ref 3.5–5.1)
Sodium: 141 mmol/L (ref 135–145)

## 2014-08-29 LAB — CBC WITH DIFFERENTIAL/PLATELET
BASOS PCT: 1 % (ref 0–1)
Basophils Absolute: 0.1 10*3/uL (ref 0.0–0.1)
EOS ABS: 0.3 10*3/uL (ref 0.0–0.7)
EOS PCT: 3 % (ref 0–5)
HCT: 42.8 % (ref 39.0–52.0)
Hemoglobin: 14.3 g/dL (ref 13.0–17.0)
Lymphocytes Relative: 44 % (ref 12–46)
Lymphs Abs: 4.6 10*3/uL — ABNORMAL HIGH (ref 0.7–4.0)
MCH: 31.1 pg (ref 26.0–34.0)
MCHC: 33.4 g/dL (ref 30.0–36.0)
MCV: 93 fL (ref 78.0–100.0)
Monocytes Absolute: 0.9 10*3/uL (ref 0.1–1.0)
Monocytes Relative: 9 % (ref 3–12)
Neutro Abs: 4.5 10*3/uL (ref 1.7–7.7)
Neutrophils Relative %: 43 % (ref 43–77)
Platelets: 240 10*3/uL (ref 150–400)
RBC: 4.6 MIL/uL (ref 4.22–5.81)
RDW: 14 % (ref 11.5–15.5)
WBC: 10.4 10*3/uL (ref 4.0–10.5)

## 2014-08-29 NOTE — Pre-Procedure Instructions (Signed)
Patient given information to sign up for my chart at home. 

## 2014-08-29 NOTE — Patient Instructions (Signed)
Mark Rios  08/29/2014   Your procedure is scheduled on:   08/31/2014  Report to [facility] at  700  AM.  Call this number if you have problems the morning of surgery: [phone]   Remember:   Do not eat food or drink liquids after midnight.   Take these medicines the morning of surgery with A SIP OF WATER: coreg, lisinopril   Do not wear jewelry, make-up or nail polish.  Do not wear lotions, powders, or perfumes.   Do not shave 48 hours prior to surgery. Men may shave face and neck.  Do not bring valuables to the hospital.  [facility] is not responsible for any belongings or valuables.               Contacts, dentures or bridgework may not be worn into surgery.  Leave suitcase in the car. After surgery it may be brought to your room.  For patients admitted to the hospital, discharge time is determined by your treatment team.               Patients discharged the day of surgery will not be allowed to drive home.  Name and phone number of your driver: family  Special Instructions: Shower using CHG 2 nights before surgery and the night before surgery.  If you shower the day of surgery use CHG.  Use special wash - you have one bottle of CHG for all showers.  You should use approximately 1/3 of the bottle for each shower.   Please read over the following fact sheets that you were given: Pain Booklet, Coughing and Deep Breathing, Surgical Site Infection Prevention, Anesthesia Post-op Instructions and Care and Recovery After Surgery Hernia A hernia occurs when an internal organ pushes out through a weak spot in the abdominal wall. Hernias most commonly occur in the groin and around the navel. Hernias often can be pushed back into place (reduced). Most hernias tend to get worse over time. Some abdominal hernias can get stuck in the opening (irreducible or incarcerated hernia) and cannot be reduced. An irreducible abdominal hernia which is tightly squeezed into the opening is at risk  for impaired blood supply (strangulated hernia). A strangulated hernia is a medical emergency. Because of the risk for an irreducible or strangulated hernia, surgery may be recommended to repair a hernia. CAUSES   Heavy lifting.  Prolonged coughing.  Straining to have a bowel movement.  A cut (incision) made during an abdominal surgery. HOME CARE INSTRUCTIONS   Bed rest is not required. You may continue your normal activities.  Avoid lifting more than 10 pounds (4.5 kg) or straining.  Cough gently. If you are a smoker it is best to stop. Even the best hernia repair can break down with the continual strain of coughing. Even if you do not have your hernia repaired, a cough will continue to aggravate the problem.  Do not wear anything tight over your hernia. Do not try to keep it in with an outside bandage or truss. These can damage abdominal contents if they are trapped within the hernia sac.  Eat a normal diet.  Avoid constipation. Straining over long periods of time will increase hernia size and encourage breakdown of repairs. If you cannot do this with diet alone, stool softeners may be used. SEEK IMMEDIATE MEDICAL CARE IF:   You have a fever.  You develop increasing abdominal pain.  You feel nauseous or vomit.  Your hernia is  stuck outside the abdomen, looks discolored, feels hard, or is tender.  You have any changes in your bowel habits or in the hernia that are unusual for you.  You have increased pain or swelling around the hernia.  You cannot push the hernia back in place by applying gentle pressure while lying down. MAKE SURE YOU:   Understand these instructions.  Will watch your condition.  Will get help right away if you are not doing well or get worse. Document Released: 08/05/2005 Document Revised: 10/28/2011 Document Reviewed: 03/24/2008 [facility] Patient Information 2015 [place], [place]. This information is not intended to replace advice given to you by your  health care provider. Make sure you discuss any questions you have with your health care provider. PATIENT INSTRUCTIONS POST-ANESTHESIA  IMMEDIATELY FOLLOWING SURGERY:  Do not drive or operate machinery for the first twenty four hours after surgery.  Do not make any important decisions for twenty four hours after surgery or while taking narcotic pain medications or sedatives.  If you develop intractable nausea and vomiting or a severe headache please notify your doctor immediately.  FOLLOW-UP:  Please make an appointment with your surgeon as instructed. You do not need to follow up with anesthesia unless specifically instructed to do so.  WOUND CARE INSTRUCTIONS (if applicable):  Keep a dry clean dressing on the anesthesia/puncture wound site if there is drainage.  Once the wound has quit draining you may leave it open to air.  Generally you should leave the bandage intact for twenty four hours unless there is drainage.  If the epidural site drains for more than 36-48 hours please call the anesthesia department.  QUESTIONS?:  Please feel free to call your physician or the hospital operator if you have any questions, and they will be happy to assist you.

## 2014-08-31 ENCOUNTER — Encounter (HOSPITAL_COMMUNITY)
Admission: RE | Disposition: A | Discharge status: Home / Self Care | Payer: Self-pay | Source: Ambulatory Visit | Attending: General Surgery

## 2014-08-31 ENCOUNTER — Ambulatory Visit (HOSPITAL_COMMUNITY): Payer: Medicare Other | Admitting: Anesthesiology

## 2014-08-31 ENCOUNTER — Encounter (HOSPITAL_COMMUNITY): Payer: Self-pay | Admitting: *Deleted

## 2014-08-31 ENCOUNTER — Ambulatory Visit (HOSPITAL_COMMUNITY)
Admission: RE | Admit: 2014-08-31 | Discharge: 2014-08-31 | Disposition: A | Discharge status: Home / Self Care | Payer: Medicare Other | Source: Ambulatory Visit | Attending: General Surgery | Admitting: General Surgery

## 2014-08-31 DIAGNOSIS — Z8673 Personal history of transient ischemic attack (TIA), and cerebral infarction without residual deficits: Secondary | ICD-10-CM | POA: Insufficient documentation

## 2014-08-31 DIAGNOSIS — J449 Chronic obstructive pulmonary disease, unspecified: Secondary | ICD-10-CM | POA: Insufficient documentation

## 2014-08-31 DIAGNOSIS — E119 Type 2 diabetes mellitus without complications: Secondary | ICD-10-CM | POA: Insufficient documentation

## 2014-08-31 DIAGNOSIS — K429 Umbilical hernia without obstruction or gangrene: Secondary | ICD-10-CM | POA: Diagnosis not present

## 2014-08-31 DIAGNOSIS — I10 Essential (primary) hypertension: Secondary | ICD-10-CM | POA: Diagnosis not present

## 2014-08-31 DIAGNOSIS — Z79899 Other long term (current) drug therapy: Secondary | ICD-10-CM | POA: Insufficient documentation

## 2014-08-31 DIAGNOSIS — I2510 Atherosclerotic heart disease of native coronary artery without angina pectoris: Secondary | ICD-10-CM | POA: Insufficient documentation

## 2014-08-31 DIAGNOSIS — K4090 Unilateral inguinal hernia, without obstruction or gangrene, not specified as recurrent: Secondary | ICD-10-CM | POA: Diagnosis not present

## 2014-08-31 HISTORY — PX: INSERTION OF MESH: SHX5868

## 2014-08-31 HISTORY — PX: UMBILICAL HERNIA REPAIR: SHX196

## 2014-08-31 HISTORY — PX: INGUINAL HERNIA REPAIR: SHX194

## 2014-08-31 LAB — GLUCOSE, CAPILLARY
GLUCOSE-CAPILLARY: 107 mg/dL — AB (ref 70–99)
Glucose-Capillary: 112 mg/dL — ABNORMAL HIGH (ref 70–99)

## 2014-08-31 SURGERY — REPAIR, HERNIA, UMBILICAL, ADULT
Anesthesia: General | Site: Groin | Laterality: Right

## 2014-08-31 MED ORDER — CHLORHEXIDINE GLUCONATE 4 % EX LIQD: 1.0000 "application " | Freq: Once | CUTANEOUS | Status: DC

## 2014-08-31 MED ORDER — PROPOFOL 10 MG/ML IV BOLUS
INTRAVENOUS | Status: DC | PRN
  Administered 2014-08-31: 150 mg via INTRAVENOUS

## 2014-08-31 MED ORDER — BUPIVACAINE LIPOSOME 1.3 % IJ SUSP
INTRAMUSCULAR | Status: AC
  Filled 2014-08-31: qty 20

## 2014-08-31 MED ORDER — BUPIVACAINE LIPOSOME 1.3 % IJ SUSP
INTRAMUSCULAR | Status: DC | PRN
  Administered 2014-08-31: 15 mL
  Administered 2014-08-31: 5 mL

## 2014-08-31 MED ORDER — CEFAZOLIN SODIUM-DEXTROSE 2-3 GM-% IV SOLR
2.0000 g | INTRAVENOUS | Status: AC
  Administered 2014-08-31: 2 g via INTRAVENOUS
  Filled 2014-08-31: qty 50

## 2014-08-31 MED ORDER — MIDAZOLAM HCL 2 MG/2ML IJ SOLN
1.0000 mg | INTRAMUSCULAR | Status: DC | PRN
  Administered 2014-08-31: 2 mg via INTRAVENOUS
  Filled 2014-08-31: qty 2

## 2014-08-31 MED ORDER — PHENYLEPHRINE HCL 10 MG/ML IJ SOLN
INTRAMUSCULAR | Status: DC | PRN
  Administered 2014-08-31 (×2): 50 ug via INTRAVENOUS
  Administered 2014-08-31: 100 ug via INTRAVENOUS
  Administered 2014-08-31: 50 ug via INTRAVENOUS

## 2014-08-31 MED ORDER — POVIDONE-IODINE 10 % OINT PACKET
TOPICAL_OINTMENT | CUTANEOUS | Status: DC | PRN
  Administered 2014-08-31: 1 via TOPICAL

## 2014-08-31 MED ORDER — LIDOCAINE HCL (CARDIAC) 20 MG/ML IV SOLN
INTRAVENOUS | Status: DC | PRN
  Administered 2014-08-31: 50 mg via INTRAVENOUS

## 2014-08-31 MED ORDER — ONDANSETRON HCL 4 MG/2ML IJ SOLN
4.0000 mg | Freq: Once | INTRAMUSCULAR | Status: AC
  Administered 2014-08-31: 4 mg via INTRAVENOUS
  Filled 2014-08-31: qty 2

## 2014-08-31 MED ORDER — LIDOCAINE HCL (PF) 1 % IJ SOLN
INTRAMUSCULAR | Status: AC
  Filled 2014-08-31: qty 5

## 2014-08-31 MED ORDER — EPHEDRINE SULFATE 50 MG/ML IJ SOLN
INTRAMUSCULAR | Status: AC
  Filled 2014-08-31: qty 1

## 2014-08-31 MED ORDER — PROPOFOL 10 MG/ML IV BOLUS
INTRAVENOUS | Status: AC
  Filled 2014-08-31: qty 20

## 2014-08-31 MED ORDER — LACTATED RINGERS IV SOLN
INTRAVENOUS | Status: DC
  Administered 2014-08-31: 08:00:00 via INTRAVENOUS

## 2014-08-31 MED ORDER — GLYCOPYRROLATE 0.2 MG/ML IJ SOLN
INTRAMUSCULAR | Status: DC | PRN
  Administered 2014-08-31: 0.2 mg via INTRAVENOUS

## 2014-08-31 MED ORDER — POVIDONE-IODINE 10 % EX OINT
TOPICAL_OINTMENT | CUTANEOUS | Status: AC
  Filled 2014-08-31: qty 2

## 2014-08-31 MED ORDER — SODIUM CHLORIDE 0.9 % IJ SOLN
INTRAMUSCULAR | Status: AC
  Filled 2014-08-31: qty 10

## 2014-08-31 MED ORDER — FENTANYL CITRATE 0.05 MG/ML IJ SOLN
INTRAMUSCULAR | Status: DC | PRN
  Administered 2014-08-31: 50 ug via INTRAVENOUS
  Administered 2014-08-31: 25 ug via INTRAVENOUS

## 2014-08-31 MED ORDER — SUCCINYLCHOLINE CHLORIDE 20 MG/ML IJ SOLN
INTRAMUSCULAR | Status: AC
  Filled 2014-08-31: qty 1

## 2014-08-31 MED ORDER — OXYCODONE-ACETAMINOPHEN 7.5-325 MG PO TABS: 1.0000 | ORAL_TABLET | ORAL | Status: DC | PRN

## 2014-08-31 MED ORDER — SODIUM CHLORIDE 0.9 % IR SOLN
Status: DC | PRN
  Administered 2014-08-31: 1000 mL

## 2014-08-31 MED ORDER — BUPIVACAINE HCL (PF) 0.5 % IJ SOLN
INTRAMUSCULAR | Status: AC
  Filled 2014-08-31: qty 30

## 2014-08-31 MED ORDER — FENTANYL CITRATE 0.05 MG/ML IJ SOLN
INTRAMUSCULAR | Status: AC
  Filled 2014-08-31: qty 5

## 2014-08-31 MED ORDER — FENTANYL CITRATE 0.05 MG/ML IJ SOLN
25.0000 ug | INTRAMUSCULAR | Status: AC
  Administered 2014-08-31 (×2): 25 ug via INTRAVENOUS
  Filled 2014-08-31: qty 2

## 2014-08-31 MED ORDER — ONDANSETRON HCL 4 MG/2ML IJ SOLN: 4.0000 mg | Freq: Once | INTRAMUSCULAR | Status: DC | PRN

## 2014-08-31 MED ORDER — KETOROLAC TROMETHAMINE 30 MG/ML IJ SOLN
30.0000 mg | Freq: Once | INTRAMUSCULAR | Status: AC
  Administered 2014-08-31: 30 mg via INTRAVENOUS
  Filled 2014-08-31: qty 1

## 2014-08-31 MED ORDER — LACTATED RINGERS IV SOLN
INTRAVENOUS | Status: DC | PRN
  Administered 2014-08-31 (×2): via INTRAVENOUS

## 2014-08-31 MED ORDER — FENTANYL CITRATE 0.05 MG/ML IJ SOLN
25.0000 ug | INTRAMUSCULAR | Status: DC | PRN
  Administered 2014-08-31 (×2): 50 ug via INTRAVENOUS
  Filled 2014-08-31: qty 2

## 2014-08-31 MED ORDER — SODIUM CHLORIDE 0.9 % IJ SOLN
INTRAMUSCULAR | Status: AC
  Filled 2014-08-31: qty 20

## 2014-08-31 MED ORDER — EPHEDRINE SULFATE 50 MG/ML IJ SOLN
INTRAMUSCULAR | Status: DC | PRN
  Administered 2014-08-31 (×3): 10 mg via INTRAVENOUS

## 2014-08-31 MED ORDER — GLYCOPYRROLATE 0.2 MG/ML IJ SOLN
INTRAMUSCULAR | Status: AC
  Filled 2014-08-31: qty 1

## 2014-08-31 MED ORDER — PHENYLEPHRINE HCL 10 MG/ML IJ SOLN
INTRAMUSCULAR | Status: AC
  Filled 2014-08-31: qty 1

## 2014-08-31 SURGICAL SUPPLY — 43 items
BAG HAMPER (MISCELLANEOUS) ×5 IMPLANT
BLADE SURG SZ10 CARB STEEL (BLADE) ×2 IMPLANT
CLOTH BEACON ORANGE TIMEOUT ST (SAFETY) ×5 IMPLANT
COVER LIGHT HANDLE STERIS (MISCELLANEOUS) ×10 IMPLANT
DECANTER SPIKE VIAL GLASS SM (MISCELLANEOUS) ×5 IMPLANT
DRAIN PENROSE 18X1/2 LTX STRL (DRAIN) ×5 IMPLANT
ELECT REM PT RETURN 9FT ADLT (ELECTROSURGICAL) ×5
ELECTRODE REM PT RTRN 9FT ADLT (ELECTROSURGICAL) ×3 IMPLANT
GLOVE BIOGEL M 7.0 STRL (GLOVE) ×4 IMPLANT
GLOVE BIOGEL PI IND STRL 7.0 (GLOVE) IMPLANT
GLOVE BIOGEL PI INDICATOR 7.0 (GLOVE) ×4
GLOVE EXAM NITRILE LRG STRL (GLOVE) ×2 IMPLANT
GLOVE SURG SS PI 7.5 STRL IVOR (GLOVE) ×10 IMPLANT
GOWN STRL REUS W/ TWL XL LVL3 (GOWN DISPOSABLE) ×3 IMPLANT
GOWN STRL REUS W/TWL LRG LVL3 (GOWN DISPOSABLE) ×10 IMPLANT
GOWN STRL REUS W/TWL XL LVL3 (GOWN DISPOSABLE) ×5
INST SET MINOR GENERAL (KITS) ×5 IMPLANT
KIT ROOM TURNOVER APOR (KITS) ×5 IMPLANT
LIQUID BAND (GAUZE/BANDAGES/DRESSINGS) ×2 IMPLANT
MANIFOLD NEPTUNE II (INSTRUMENTS) ×5 IMPLANT
MESH MARLEX PLUG MEDIUM (Mesh General) ×2 IMPLANT
NDL HYPO 21X1.5 SAFETY (NEEDLE) ×3 IMPLANT
NDL HYPO 25X1 1.5 SAFETY (NEEDLE) ×3 IMPLANT
NEEDLE HYPO 21X1.5 SAFETY (NEEDLE) ×5 IMPLANT
NEEDLE HYPO 25X1 1.5 SAFETY (NEEDLE) ×5 IMPLANT
NS IRRIG 1000ML POUR BTL (IV SOLUTION) ×5 IMPLANT
PACK MINOR (CUSTOM PROCEDURE TRAY) ×5 IMPLANT
PAD ARMBOARD 7.5X6 YLW CONV (MISCELLANEOUS) ×5 IMPLANT
SCRUB PCMX 4 OZ (MISCELLANEOUS) ×5 IMPLANT
SET BASIN LINEN APH (SET/KITS/TRAYS/PACK) ×5 IMPLANT
SPONGE GAUZE 2X2 8PLY STER LF (GAUZE/BANDAGES/DRESSINGS) ×2
SPONGE GAUZE 2X2 8PLY STRL LF (GAUZE/BANDAGES/DRESSINGS) ×8 IMPLANT
STAPLER VISISTAT (STAPLE) ×5 IMPLANT
SUT ETHIBOND NAB MO 7 #0 18IN (SUTURE) ×5 IMPLANT
SUT NOVA NAB GS-22 2 2-0 T-19 (SUTURE) ×6 IMPLANT
SUT VIC AB 2-0 CT1 27 (SUTURE) ×10
SUT VIC AB 2-0 CT1 TAPERPNT 27 (SUTURE) ×3 IMPLANT
SUT VIC AB 3-0 SH 27 (SUTURE) ×10
SUT VIC AB 3-0 SH 27X BRD (SUTURE) ×3 IMPLANT
SUT VIC AB 4-0 PS2 27 (SUTURE) ×5 IMPLANT
SUT VICRYL AB 3 0 TIES (SUTURE) IMPLANT
SYR 20CC LL (SYRINGE) ×5 IMPLANT
TAPE CLOTH SURG 4X10 WHT LF (GAUZE/BANDAGES/DRESSINGS) ×2 IMPLANT

## 2014-08-31 NOTE — Op Note (Signed)
Patient:  Mark Rios  DOB:  [birth year]  MRN:  [id number]   Preop Diagnosis:  Right inguinal hernia, umbilical hernia  Postop Diagnosis:  Same  Procedure:  Right inguinal herniorrhaphy with mesh, umbilical herniorrhaphy  Surgeon:  Franky MachoMark Jadene Stemmer, M.D.  Anes:  Gen.  Indications:  Patient is a 64 year old white male who presents with both a right inguinal hernia and an umbilical hernia. The risks and benefits of both procedures including bleeding, infection, mesh use, and the possibility of recurrence of the hernias were fully explained to the patient, who gave informed consent.  Procedure note:  The patient was placed the supine position. After general anesthesia was administered, the abdomen was prepped and draped using usual sterile technique with CBG. Surgical site confirmation was performed.  I first performed an umbilical herniorrhaphy. An infraumbilical incision was made down to the fascia. The umbilicus was freed away from the underlying fascia. A very small hernia defect was found. Omentum was noted within the hernia defect. This was freed away from the fascia and reduced. The hernia defect was closed using 0 Ethibond interrupted sutures 2. The base the umbilicus was secured back to the fascia using a 2-0 Vicryl suture. The subcutaneous layer was reapproximated using 3-0 Vicryl interrupted sutures. Exparel was instilled the surrounding wound. The skin was closed using staples. Betadine ointment and a dry sterile dressing were applied.  Next, a right anal herniorrhaphy was performed. Incision was made in the right groin region down to the external oblique aponeuroses. The aponeurosis was incised to the external ring. A Penrose drain was placed around the spermatic cord. The vas deferens was noted within the spermatic cord. The ilioinguinal nerve was identified retracted superiorly from the operative field. An indirect hernia sac was found. This was freed away from the spermatic cord up  to the peritoneal reflection and inverted. A medium size Bard PerFix plug was then inserted. An onlay patch was then placed along the floor of the inguinal canal and secured superiorly to the conjoined tendon and inferiorly to the shelving edge of Poupart's ligament using 2-0 Novafil interrupted sutures. The internal ring was re-created using a 2-0 Novafil interrupted suture. The external oblique aponeuroses was reapproximated using a 2-0 Vicryl running suture. The subcutaneous layer was reapproximated using 3-0 Vicryl interrupted sutures. The skin was closed using a 4-0 Vicryl subcuticular suture. Exparel was instilled into the surrounding wound. Liquiband was applied.  All tape and needle counts were correct at the end of the procedures. The patient was awakened and transferred to PACU in stable condition.  Complications:  None  EBL:  Minimal  Specimen:  None

## 2014-08-31 NOTE — Progress Notes (Signed)
Awake. Sprite given to drink. Tolerated well. 

## 2014-08-31 NOTE — Progress Notes (Signed)
Transferred to postop rm 1 in good condition. Rates pain 1. Ready to go home.

## 2014-08-31 NOTE — Transfer of Care (Signed)
Immediate Anesthesia Transfer of Care Note  Patient: Alanda AmassWoodrow S Mclennan  Procedure(s) Performed: Procedure(s) with comments: HERNIA REPAIR UMBILICAL ADULT WITH MESH (N/A) HERNIA REPAIR INGUINAL ADULT (Right) INSERTION OF MESH (Right) - inguinal hernia  Patient Location: PACU  Anesthesia Type:General  Level of Consciousness: awake, oriented and patient cooperative  Airway & Oxygen Therapy: Patient Spontanous Breathing and Patient connected to face mask oxygen  Post-op Assessment: Report given to PACU RN and Post -op Vital signs reviewed and stable  Post vital signs: Reviewed and stable  Complications: No apparent anesthesia complications

## 2014-08-31 NOTE — Progress Notes (Signed)
Mark Rios in postop called report. No rooms available at this time. Hold pt in PACU.

## 2014-08-31 NOTE — Progress Notes (Signed)
Awake. Wants to go home. Dressed in recovery. To w/c. Waiting on room in postop.

## 2014-08-31 NOTE — Anesthesia Preprocedure Evaluation (Signed)
Anesthesia Evaluation  Patient identified by MRN, date of birth, ID band Patient awake    Reviewed: Allergy & Precautions, H&P , NPO status , Patient's Chart, lab work & pertinent test results  Airway Mallampati: III  TM Distance: >3 FB Neck ROM: full    Dental  (+) Edentulous Upper, Edentulous Lower   Pulmonary COPDCurrent Smoker,  breath sounds clear to auscultation        Cardiovascular hypertension, + CAD and + CABG + dysrhythmias Rhythm:Regular Rate:Normal     Neuro/Psych  Neuromuscular disease CVA, Residual Symptoms    GI/Hepatic   Endo/Other  diabetes, Type 2  Renal/GU      Musculoskeletal   Abdominal   Peds  Hematology   Anesthesia Other Findings   Reproductive/Obstetrics                             Anesthesia Physical Anesthesia Plan  ASA: III  Anesthesia Plan: General   Post-op Pain Management:    Induction: Intravenous  Airway Management Planned: LMA  Additional Equipment:   Intra-op Plan:   Post-operative Plan: Extubation in OR  Informed Consent: I have reviewed the patients History and Physical, chart, labs and discussed the procedure including the risks, benefits and alternatives for the proposed anesthesia with the patient or authorized representative who has indicated his/her understanding and acceptance.     Plan Discussed with:   Anesthesia Plan Comments:         Anesthesia Quick Evaluation

## 2014-08-31 NOTE — Discharge Instructions (Signed)
Inguinal Hernia, Adult  °Care After °Refer to this sheet in the next few weeks. These discharge instructions provide you with general information on caring for yourself after you leave the hospital. Your caregiver may also give you specific instructions. Your treatment has been planned according to the most current medical practices available, but unavoidable complications sometimes occur. If you have any problems or questions after discharge, please call your caregiver. °HOME CARE INSTRUCTIONS °· Put ice on the operative site. °¨ Put ice in a plastic bag. °¨ Place a towel between your skin and the bag. °¨ Leave the ice on for 15-20 minutes at a time, 03-04 times a day while awake. °· Change bandages (dressings) as directed. °· Keep the wound dry and clean. The wound may be washed gently with soap and water. Gently blot or dab the wound dry. It is okay to take showers 24 to 48 hours after surgery. Do not take baths, use swimming pools, or use hot tubs for 10 days, or as directed by your caregiver. °· Only take over-the-counter or prescription medicines for pain, discomfort, or fever as directed by your caregiver. °· Continue your normal diet as directed. °· Do not lift anything more than 10 pounds or play contact sports for 3 weeks, or as directed. °SEEK MEDICAL CARE IF: °· There is redness, swelling, or increasing pain in the wound. °· There is fluid (pus) coming from the wound. °· There is drainage from a wound lasting longer than 1 day. °· You have an oral temperature above 102° F (38.9° C). °· You notice a bad smell coming from the wound or dressing. °· The wound breaks open after the stitches (sutures) have been removed. °· You notice increasing pain in the shoulders (shoulder strap areas). °· You develop dizzy episodes or fainting while standing. °· You feel sick to your stomach (nauseous) or throw up (vomit). °SEEK IMMEDIATE MEDICAL CARE IF: °· You develop a rash. °· You have difficulty breathing. °· You  develop a reaction or have side effects to medicines you were given. °MAKE SURE YOU:  °· Understand these instructions. °· Will watch your condition. °· Will get help right away if you are not doing well or get worse. °Document Released: 09/05/2006 Document Revised: 10/28/2011 Document Reviewed: 07/05/2009 °[employer]® Patient Information ©2015 [employer]. This information is not intended to replace advice given to you by your health care provider. Make sure you discuss any questions you have with your health care provider. ° °

## 2014-08-31 NOTE — Interval H&P Note (Signed)
History and Physical Interval Note:  08/31/2014 8:06 AM  Mark Rios  has presented today for surgery, with the diagnosis of inguinal hernia, umbilical hernia  The various methods of treatment have been discussed with the patient and family. After consideration of risks, benefits and other options for treatment, the patient has consented to  Procedure(s): HERNIA REPAIR INGUINAL ADULT WITH MESH (N/A) HERNIA REPAIR UMBILICAL ADULT WITH MESH (N/A) as a surgical intervention .  The patient's history has been reviewed, patient examined, no change in status, stable for surgery.  I have reviewed the patient's chart and labs.  Questions were answered to the patient's satisfaction.     Franky MachoJENKINS,Atom Solivan A

## 2014-08-31 NOTE — Anesthesia Postprocedure Evaluation (Signed)
  Anesthesia Post-op Note Late entry Patient: Mark Rios  Procedure(s) Performed: Procedure(s) with comments: HERNIA REPAIR UMBILICAL ADULT WITH MESH (N/A) HERNIA REPAIR INGUINAL ADULT (Right) INSERTION OF MESH (Right) - inguinal hernia  Patient Location: PACU  Anesthesia Type:General  Level of Consciousness: awake, alert , oriented and patient cooperative  Airway and Oxygen Therapy: Patient Spontanous Breathing  Post-op Pain: none  Post-op Assessment: Post-op Vital signs reviewed, Patient's Cardiovascular Status Stable, Respiratory Function Stable, Patent Airway, No signs of Nausea or vomiting and Pain level controlled  Post-op Vital Signs: Reviewed and stable  Last Vitals:  Filed Vitals:   08/31/14 1041  BP: 137/74  Pulse: 83  Temp: 36.3 C  Resp: 16    Complications: No apparent anesthesia complications

## 2014-08-31 NOTE — Anesthesia Procedure Notes (Signed)
Procedure Name: LMA Insertion Date/Time: 08/31/2014 8:32 AM Performed by: Pernell DupreADAMS, AMY A Pre-anesthesia Checklist: Patient identified, Timeout performed, Emergency Drugs available, Suction available and Patient being monitored Patient Re-evaluated:Patient Re-evaluated prior to inductionOxygen Delivery Method: Circle system utilized Preoxygenation: Pre-oxygenation with 100% oxygen Intubation Type: IV induction Ventilation: Mask ventilation without difficulty LMA: LMA inserted LMA Size: 5.0 Number of attempts: 1 Placement Confirmation: positive ETCO2 and breath sounds checked- equal and bilateral Tube secured with: Tape Dental Injury: Teeth and Oropharynx as per pre-operative assessment

## 2014-09-01 ENCOUNTER — Encounter (HOSPITAL_COMMUNITY): Payer: Self-pay | Admitting: General Surgery

## 2014-11-18 ENCOUNTER — Emergency Department (HOSPITAL_COMMUNITY)
Admission: EM | Admit: 2014-11-18 | Discharge: 2014-11-18 | Disposition: A | Discharge status: Home / Self Care | Payer: Medicare Other | Attending: Emergency Medicine | Admitting: Emergency Medicine

## 2014-11-18 ENCOUNTER — Emergency Department (HOSPITAL_COMMUNITY): Payer: Medicare Other

## 2014-11-18 ENCOUNTER — Encounter (HOSPITAL_COMMUNITY): Payer: Self-pay

## 2014-11-18 DIAGNOSIS — Z7982 Long term (current) use of aspirin: Secondary | ICD-10-CM | POA: Insufficient documentation

## 2014-11-18 DIAGNOSIS — I10 Essential (primary) hypertension: Secondary | ICD-10-CM | POA: Diagnosis not present

## 2014-11-18 DIAGNOSIS — Z951 Presence of aortocoronary bypass graft: Secondary | ICD-10-CM | POA: Insufficient documentation

## 2014-11-18 DIAGNOSIS — R197 Diarrhea, unspecified: Secondary | ICD-10-CM

## 2014-11-18 DIAGNOSIS — Z9861 Coronary angioplasty status: Secondary | ICD-10-CM | POA: Diagnosis not present

## 2014-11-18 DIAGNOSIS — E119 Type 2 diabetes mellitus without complications: Secondary | ICD-10-CM | POA: Insufficient documentation

## 2014-11-18 DIAGNOSIS — Z79899 Other long term (current) drug therapy: Secondary | ICD-10-CM | POA: Diagnosis not present

## 2014-11-18 DIAGNOSIS — R112 Nausea with vomiting, unspecified: Secondary | ICD-10-CM | POA: Insufficient documentation

## 2014-11-18 DIAGNOSIS — J449 Chronic obstructive pulmonary disease, unspecified: Secondary | ICD-10-CM | POA: Diagnosis not present

## 2014-11-18 DIAGNOSIS — E780 Pure hypercholesterolemia: Secondary | ICD-10-CM | POA: Diagnosis not present

## 2014-11-18 DIAGNOSIS — R1084 Generalized abdominal pain: Secondary | ICD-10-CM | POA: Diagnosis present

## 2014-11-18 DIAGNOSIS — Z720 Tobacco use: Secondary | ICD-10-CM | POA: Diagnosis not present

## 2014-11-18 DIAGNOSIS — Z8673 Personal history of transient ischemic attack (TIA), and cerebral infarction without residual deficits: Secondary | ICD-10-CM | POA: Diagnosis not present

## 2014-11-18 LAB — HEPATIC FUNCTION PANEL
ALT: 19 U/L (ref 0–53)
AST: 17 U/L (ref 0–37)
Albumin: 3.8 g/dL (ref 3.5–5.2)
Alkaline Phosphatase: 54 U/L (ref 39–117)
Bilirubin, Direct: 0.1 mg/dL (ref 0.0–0.5)
Indirect Bilirubin: 0.6 mg/dL (ref 0.3–0.9)
Total Bilirubin: 0.7 mg/dL (ref 0.3–1.2)
Total Protein: 6.8 g/dL (ref 6.0–8.3)

## 2014-11-18 LAB — CBC WITH DIFFERENTIAL/PLATELET
Basophils Absolute: 0.1 10*3/uL (ref 0.0–0.1)
Basophils Relative: 1 % (ref 0–1)
EOS PCT: 8 % — AB (ref 0–5)
Eosinophils Absolute: 1.3 10*3/uL — ABNORMAL HIGH (ref 0.0–0.7)
HEMATOCRIT: 44.6 % (ref 39.0–52.0)
Hemoglobin: 15.1 g/dL (ref 13.0–17.0)
LYMPHS ABS: 4.4 10*3/uL — AB (ref 0.7–4.0)
LYMPHS PCT: 28 % (ref 12–46)
MCH: 31.5 pg (ref 26.0–34.0)
MCHC: 33.9 g/dL (ref 30.0–36.0)
MCV: 92.9 fL (ref 78.0–100.0)
MONOS PCT: 10 % (ref 3–12)
Monocytes Absolute: 1.6 10*3/uL — ABNORMAL HIGH (ref 0.1–1.0)
Neutro Abs: 8.5 10*3/uL — ABNORMAL HIGH (ref 1.7–7.7)
Neutrophils Relative %: 53 % (ref 43–77)
Platelets: 339 10*3/uL (ref 150–400)
RBC: 4.8 MIL/uL (ref 4.22–5.81)
RDW: 13.8 % (ref 11.5–15.5)
WBC: 15.9 10*3/uL — ABNORMAL HIGH (ref 4.0–10.5)

## 2014-11-18 LAB — URINALYSIS, ROUTINE W REFLEX MICROSCOPIC
Bilirubin Urine: NEGATIVE
Glucose, UA: NEGATIVE mg/dL
Hgb urine dipstick: NEGATIVE
LEUKOCYTES UA: NEGATIVE
NITRITE: NEGATIVE
PH: 5.5 (ref 5.0–8.0)
PROTEIN: NEGATIVE mg/dL
Specific Gravity, Urine: >1.03 — ABNORMAL HIGH (ref 1.005–1.030)
Urobilinogen, UA: 0.2 mg/dL (ref 0.0–1.0)

## 2014-11-18 LAB — BASIC METABOLIC PANEL
Anion gap: 11 (ref 5–15)
BUN: 21 mg/dL (ref 6–23)
CO2: 23 mmol/L (ref 19–32)
Calcium: 9.6 mg/dL (ref 8.4–10.5)
Chloride: 105 mmol/L (ref 96–112)
Creatinine, Ser: 1.08 mg/dL (ref 0.50–1.35)
GFR calc Af Amer: 83 mL/min — ABNORMAL LOW (ref >90)
GFR calc non Af Amer: 72 mL/min — ABNORMAL LOW (ref >90)
GLUCOSE: 105 mg/dL — AB (ref 70–99)
POTASSIUM: 4.1 mmol/L (ref 3.5–5.1)
SODIUM: 139 mmol/L (ref 135–145)

## 2014-11-18 LAB — CBG MONITORING, ED: GLUCOSE-CAPILLARY: 92 mg/dL (ref 70–99)

## 2014-11-18 LAB — LIPASE, BLOOD: Lipase: 53 U/L (ref 11–59)

## 2014-11-18 LAB — CLOSTRIDIUM DIFFICILE BY PCR: Toxigenic C. Difficile by PCR: NEGATIVE

## 2014-11-18 MED ORDER — IOHEXOL 300 MG/ML  SOLN
100.0000 mL | Freq: Once | INTRAMUSCULAR | Status: AC | PRN
  Administered 2014-11-18: 100 mL via INTRAVENOUS

## 2014-11-18 MED ORDER — IOHEXOL 300 MG/ML  SOLN
50.0000 mL | Freq: Once | INTRAMUSCULAR | Status: AC | PRN
  Administered 2014-11-18: 50 mL via ORAL

## 2014-11-18 MED ORDER — SODIUM CHLORIDE 0.9 % IV SOLN
INTRAVENOUS | Status: DC
  Administered 2014-11-18: 15:00:00 via INTRAVENOUS

## 2014-11-18 MED ORDER — SODIUM CHLORIDE 0.9 % IV BOLUS (SEPSIS)
250.0000 mL | Freq: Once | INTRAVENOUS | Status: AC
  Administered 2014-11-18: 250 mL via INTRAVENOUS

## 2014-11-18 NOTE — ED Notes (Signed)
Pt reports to ED w/ c/o adominal pain.  Pt states that he has had diarrhea for 2 wks.  Pt states that "everytime I eat something, it just comes out 5 minutes later.".  Pt reports that the stool has been watery and loose. Pt states that he had emesis last week 1x.  Pt states that he has taken a whole pack of Imodium AD over the course of 2x weeks and Peptobismol last week w/ no relief.

## 2014-11-18 NOTE — ED Provider Notes (Signed)
CSN: [id number]     Arrival date & time 11/18/14  1221 History   First MD Initiated Contact with Patient 11/18/14 1500     Chief Complaint  Patient presents with  . Abdominal Pain  . Diarrhea      HPI Pt was seen at 1505.  Per pt, c/o gradual onset and persistence of constant generalized abd "pain" for the past 2 weeks.  Has been associated with multiple intermittent episodes of diarrhea (at least 5-6 stools/day).  Describes the abd pain as "cramping." Describes the stools as "watery" and worsen after he eats. Pt states he had N/V 2 weeks ago, but that has since resolved. Pt has been taking Imodium AD and Pepto Bismol without relief of his symptoms. Denies fevers, no back pain, no rash, no CP/SOB, no black or blood in stools or emesis.  Denies recent travel, recent abx, or recent contacts with similar symptoms.     Past Medical History  Diagnosis Date  . Abdominal hernia   . Diabetes mellitus without complication   . COPD (chronic obstructive pulmonary disease)   . Hypertension   . High cholesterol   . Arrhythmia   . Pneumothorax   . Traumatic diaphragmatic hernia     Repaired 07/01/2014  . Stroke 2010    right leg slightly weaker and small amt of memory affected.   Past Surgical History  Procedure Laterality Date  . Coronary artery bypass graft      x3  . Hemorroidectomy    . Video assisted thoracoscopy (vats)/thorocotomy Left 07/01/2014    Procedure: with repair left diaphragmatic hernia ;  Surgeon: Loreli SlotSteven C Hendrickson, MD;  [facility];  Service: Thoracic;  Laterality: Left;  . Video bronchoscopy N/A 07/01/2014    Procedure: VIDEO BRONCHOSCOPY;  Surgeon: Loreli SlotSteven C Hendrickson, MD;  [facility];  Service: Thoracic;  Laterality: N/A;  . Carotid artery angioplasty Left     2007  . Umbilical hernia repair N/A 08/31/2014    Procedure: HERNIA REPAIR UMBILICAL ADULT ;  Surgeon: Dalia HeadingMark A Jenkins, MD;  Location: [facility];  Service: General;  Laterality: N/A;  . Inguinal hernia  repair Right 08/31/2014    Procedure: HERNIA REPAIR INGUINAL ADULT;  Surgeon: Dalia HeadingMark A Jenkins, MD;  Location: [facility];  Service: General;  Laterality: Right;  . Insertion of mesh Right 08/31/2014    Procedure: INSERTION OF MESH;  Surgeon: Dalia HeadingMark A Jenkins, MD;  Location: [facility];  Service: General;  Laterality: Right;  inguinal hernia   No family history on file. History  Substance Use Topics  . Smoking status: Current Every Day Smoker -- 0.50 packs/day for 48 years    Types: Cigarettes  . Smokeless tobacco: Never Used  . Alcohol Use: No    Review of Systems ROS: Statement: All systems negative except as marked or noted in the HPI; Constitutional: Negative for fever and chills. ; ; Eyes: Negative for eye pain, redness and discharge. ; ; ENMT: Negative for ear pain, hoarseness, nasal congestion, sinus pressure and sore throat. ; ; Cardiovascular: Negative for chest pain, palpitations, diaphoresis, dyspnea and peripheral edema. ; ; Respiratory: Negative for cough, wheezing and stridor. ; ; Gastrointestinal: +N/V/D, abd pain. Negative for blood in stool, hematemesis, jaundice and rectal bleeding. . ; ; Genitourinary: Negative for dysuria, flank pain and hematuria. ; ; Musculoskeletal: Negative for back pain and neck pain. Negative for swelling and trauma.; ; Skin: Negative for pruritus, rash, abrasions, blisters, bruising and skin lesion.; ; Neuro: Negative for  headache, lightheadedness and neck stiffness. Negative for weakness, altered level of consciousness , altered mental status, extremity weakness, paresthesias, involuntary movement, seizure and syncope.     Allergies  Review of patient's allergies indicates no known allergies.  Home Medications   Prior to Admission medications   Medication Sig Start Date End Date Taking? Authorizing Provider  albuterol (PROVENTIL HFA;VENTOLIN HFA) 108 (90 BASE) MCG/ACT inhaler Inhale 2 puffs into the lungs every 6 (six) hours as needed for wheezing or shortness  of breath.    Historical Provider, MD  albuterol (PROVENTIL) (2.5 MG/3ML) 0.083% nebulizer solution Take 2.5 mg by nebulization every 4 (four) hours as needed for wheezing.    Historical Provider, MD  aspirin 325 MG EC tablet Take 325 mg by mouth at bedtime.     Historical Provider, MD  carvedilol (COREG) 12.5 MG tablet Take 18.25 mg by mouth 2 (two) times daily with a meal.     Historical Provider, MD  ipratropium (ATROVENT) 0.02 % nebulizer solution Take 500 mcg by nebulization every 4 (four) hours as needed for wheezing.    Historical Provider, MD  lisinopril (PRINIVIL,ZESTRIL) 10 MG tablet Take 10 mg by mouth daily.    Historical Provider, MD  metFORMIN (GLUCOPHAGE) 500 MG tablet Take 500-1,000 mg by mouth 2 (two) times daily with a meal. Patient takes 1000 mg in the morning and 500 mg at supper    Historical Provider, MD  oxyCODONE-acetaminophen (PERCOCET) 7.5-325 MG per tablet Take 1-2 tablets by mouth every 4 (four) hours as needed. 08/31/14   Franky Macho Md, MD  simvastatin (ZOCOR) 40 MG tablet Take 40 mg by mouth every evening.    Historical Provider, MD   BP 107/59 mmHg  Pulse 99  Temp(Src) 97.7 F (36.5 C)  Resp 18  Ht 5\' 8"  (1.727 m)  Wt 150 lb (68.04 kg)  BMI 22.81 kg/m2  SpO2 97%   Filed Vitals:   11/18/14 1237 11/18/14 1800  BP: 107/59 123/72  Pulse: 99 97  Temp: 97.7 F (36.5 C)   Resp: 18 18  Height: 5\' 8"  (1.727 m)   Weight: 150 lb (68.04 kg)   SpO2: 97% 98%     Physical Exam  1510: Physical examination:  Nursing notes reviewed; Vital signs and O2 SAT reviewed;  Constitutional: Well developed, Well nourished, Well hydrated, In no acute distress; Head:  Normocephalic, atraumatic; Eyes: EOMI, PERRL, No scleral icterus; ENMT: Mouth and pharynx normal, Mucous membranes moist; Neck: Supple, Full range of motion, No lymphadenopathy; Cardiovascular: Regular rate and rhythm, No gallop; Respiratory: Breath sounds clear & equal bilaterally, No wheezes.  Speaking full  sentences with ease, Normal respiratory effort/excursion; Chest: Nontender, Movement normal; Abdomen: Soft, +mild diffuse tenderness to palp. No rebound or guarding.  Nondistended, Normal bowel sounds; Genitourinary: No CVA tenderness; Extremities: Pulses normal, No tenderness, No edema, No calf edema or asymmetry.; Neuro: AA&Ox3, Major CN grossly intact.  Speech clear. No gross focal motor or sensory deficits in extremities. Climbs on and off stretcher easily by himself. Gait steady.; Skin: Color normal, Warm, Dry.   ED Course  Procedures     EKG Interpretation None      MDM  MDM Reviewed: previous chart, nursing note and vitals Reviewed previous: labs Interpretation: labs and CT scan     Results for orders placed or performed during the hospital encounter of 11/18/14  Clostridium Difficile by PCR  Result Value Ref Range   C difficile by pcr NEGATIVE NEGATIVE  CBC WITH DIFFERENTIAL  Result Value Ref Range   WBC 15.9 (H) 4.0 - 10.5 K/uL   RBC 4.80 4.22 - 5.81 MIL/uL   Hemoglobin 15.1 13.0 - 17.0 g/dL   HCT 16.1 09.6 - 04.5 %   MCV 92.9 78.0 - 100.0 fL   MCH 31.5 26.0 - 34.0 pg   MCHC 33.9 30.0 - 36.0 g/dL   RDW 40.9 81.1 - 91.4 %   Platelets 339 150 - 400 K/uL   Neutrophils Relative % 53 43 - 77 %   Neutro Abs 8.5 (H) 1.7 - 7.7 K/uL   Lymphocytes Relative 28 12 - 46 %   Lymphs Abs 4.4 (H) 0.7 - 4.0 K/uL   Monocytes Relative 10 3 - 12 %   Monocytes Absolute 1.6 (H) 0.1 - 1.0 K/uL   Eosinophils Relative 8 (H) 0 - 5 %   Eosinophils Absolute 1.3 (H) 0.0 - 0.7 K/uL   Basophils Relative 1 0 - 1 %   Basophils Absolute 0.1 0.0 - 0.1 K/uL  Basic metabolic panel  Result Value Ref Range   Sodium 139 135 - 145 mmol/L   Potassium 4.1 3.5 - 5.1 mmol/L   Chloride 105 96 - 112 mmol/L   CO2 23 19 - 32 mmol/L   Glucose, Bld 105 (H) 70 - 99 mg/dL   BUN 21 6 - 23 mg/dL   Creatinine, Ser 7.82 0.50 - 1.35 mg/dL   Calcium 9.6 8.4 - 95.6 mg/dL   GFR calc non Af Amer 72 (L) >90 mL/min    GFR calc Af Amer 83 (L) >90 mL/min   Anion gap 11 5 - 15  Urinalysis with microscopic  Result Value Ref Range   Color, Urine YELLOW YELLOW   APPearance CLEAR CLEAR   Specific Gravity, Urine >1.030 (H) 1.005 - 1.030   pH 5.5 5.0 - 8.0   Glucose, UA NEGATIVE NEGATIVE mg/dL   Hgb urine dipstick NEGATIVE NEGATIVE   Bilirubin Urine NEGATIVE NEGATIVE   Ketones, ur TRACE (A) NEGATIVE mg/dL   Protein, ur NEGATIVE NEGATIVE mg/dL   Urobilinogen, UA 0.2 0.0 - 1.0 mg/dL   Nitrite NEGATIVE NEGATIVE   Leukocytes, UA NEGATIVE NEGATIVE  Hepatic function panel  Result Value Ref Range   Total Protein 6.8 6.0 - 8.3 g/dL   Albumin 3.8 3.5 - 5.2 g/dL   AST 17 0 - 37 U/L   ALT 19 0 - 53 U/L   Alkaline Phosphatase 54 39 - 117 U/L   Total Bilirubin 0.7 0.3 - 1.2 mg/dL   Bilirubin, Direct 0.1 0.0 - 0.5 mg/dL   Indirect Bilirubin 0.6 0.3 - 0.9 mg/dL  Lipase, blood  Result Value Ref Range   Lipase 53 11 - 59 U/L  CBG monitoring, ED  Result Value Ref Range   Glucose-Capillary 92 70 - 99 mg/dL   Ct Abdomen Pelvis W Contrast 11/18/2014   CLINICAL DATA:  Abdominal pain and diarrhea for 2 weeks.  EXAM: CT ABDOMEN AND PELVIS WITH CONTRAST  TECHNIQUE: Multidetector CT imaging of the abdomen and pelvis was performed using the standard protocol following bolus administration of intravenous contrast.  CONTRAST:  50mL OMNIPAQUE IOHEXOL 300 MG/ML SOLN, OMNIPAQUE IOHEXOL 300 MG/ML SOLN  COMPARISON:  06/29/2014  FINDINGS: BODY WALL: Fatty left inguinal hernia, new from prior. There has been a right inguinal hernia repair.  LOWER CHEST: Hyperinflated lungs with diaphragm flattening.  ABDOMEN/PELVIS:  Liver: 3 small cysts, stable from previous.  Biliary: Layering gallstones without evidence of inflammation.  Pancreas: Unremarkable.  Spleen: Unremarkable.  Adrenals: Unremarkable.  Kidneys and ureters: No hydronephrosis or stone.  Bladder: Unremarkable.  Reproductive: No pathologic findings.  Bowel: No obstruction.  Normal appendix.  Colonic diverticulosis  Retroperitoneum: No mass or adenopathy.  Peritoneum: No ascites or pneumoperitoneum.  Vascular: Advanced and diffuse atherosclerosis with bilateral iliac stenoses superimposed on small caliber vessels. There is high-grade stenosis of the proximal SMA and moderate narrowing of the celiac axis. No mesenteric collaterals noted.  OSSEOUS: No acute abnormalities.  IMPRESSION: 1. No acute findings. 2. 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 3. Cholelithiasis without acute cholecystitis. 4. Fatty left inguinal hernia, new from 2015.   Electronically Signed   By: Marnee Spring M.D.   On: 11/18/2014 16:52    1820:  Pt has had 3 BM's while in the ED: heme positive but not grossly bloody. Cdiff is negative. GI pathogen panel is pending. IVF NS 1L total given with improvement in BP. EPIC chart reviewed: most recent baseline SBP appears to run 96-119. Remains afebrile. Abd benign. Pt has tol PO well while in the ED without N/V. Pt has ambulated with steady gait, easy resps, NAD. Pt states he wants his IV out now and wants to go home. Dx and testing d/w pt and family.  Questions answered.  Verb understanding, agreeable to d/c home with outpt f/u.   Samuel Jester, DO 11/21/14 2020

## 2014-11-18 NOTE — ED Notes (Signed)
Pt up to bathroom to obtain stool specimen. Unable at this time

## 2014-11-18 NOTE — ED Notes (Signed)
Pt has had 3 BMs since arrival to the ED today.

## 2014-11-18 NOTE — Discharge Instructions (Signed)
°Emergency Department Resource Guide °1) Find a Doctor and Pay Out of Pocket °Although you won't have to find out who is covered by your insurance plan, it is a good idea to ask around and get recommendations. You will then need to call the office and see if the doctor you have chosen will accept you as a new patient and what types of options they offer for patients who are self-pay. Some doctors offer discounts or will set up payment plans for their patients who do not have insurance, but you will need to ask so you aren't surprised when you get to your appointment. ° °2) Contact Your Local Health Department °Not all health departments have doctors that can see patients for sick visits, but many do, so it is worth a call to see if yours does. If you don't know where your local health department is, you can check in your phone book. The CDC also has a tool to help you locate your state's health department, and many state websites also have listings of all of their local health departments. ° °3) Find a Walk-in Clinic °If your illness is not likely to be very severe or complicated, you may want to try a walk in clinic. These are popping up all over the country in pharmacies, drugstores, and shopping centers. They're usually staffed by nurse practitioners or physician assistants that have been trained to treat common illnesses and complaints. They're usually fairly quick and inexpensive. However, if you have serious medical issues or chronic medical problems, these are probably not your best option. ° °No Primary Care Doctor: °- Call [facility] at  832-8000 - they can help you locate a primary care doctor that  accepts your insurance, provides certain services, etc. °- Physician Referral Service- 1-800-533-3463 ° °Chronic Pain Problems: °Organization         Address  Phone   Notes  °[facility]  (336) 297-2271 Patients need to be referred by their primary care doctor.  ° °Medication  Assistance: °Organization         Address  Phone   Notes  °[place] [facility] [address] °[place], [place] [address] (336) 641-8030 --Must be a resident of [place] °-- Must have NO insurance coverage whatsoever (no Medicaid/ Medicare, etc.) °-- The pt. MUST have a primary care doctor that directs their care regularly and follows them in the community °  °[facility]  (866) 331-1348   °[employer]  (888) 892-1162   ° °Agencies that provide inexpensive medical care: °Organization         Address  Phone   Notes  °[facility]  (336) 832-8035   °[facility]    (336) 832-7272   °[facility] [address] °[place], [place] [address] (336) 832-4777   °[facility] [address], °[place] (336) 271-4999   °[facility]    (336) 373-0678   °[facility]    (336) 272-1050   °[facility] ° [address], [place] Phone:  (336) 832-4444, Fax:  (336) 832-4440 Hours of Operation:  9 am - 6 pm, M-F.  Also accepts Medicaid/Medicare and self-pay.  °[facility] ° [address], [place] Phone: (336) 832-3150, Fax: (336) [phone]. Hours of Operation:  8:30 am - 5:30 pm, M-F.  Also accepts Medicaid and self-pay.  °[facility] [place] [address], [place] Phone: (336) 878-6027   °[facility] [address], [place], [place] (336)723-1848, Ext. 123 Mondays & Thursdays: 7-9 AM.  First 15 patients are seen on a first come, first serve basis. °  ° °Medicaid-accepting [place] Providers: ° °Organization         Address  Phone   Notes  °[facility] [address], [place] (336) 641-2100 Also accepts self-pay patients.  °[facility] [address], [place] ° (336) 856-9996   °[facility] [address], [place]  (336) 288-8857   °[facility] [address] [place] [address], [place] (336) 299-7000   °Veita Bland [address], [place]  ° (336) 373-1557 Only accepts [place] Access Medicaid patients after they have their name applied to their card.  ° °Self-Pay (no insurance) in [place]: ° °Organization         Address  Phone   Notes  °Sickle Cell Patients, [facility] [address], [place] (336) 832-1970   °[facility] [address], [place] (336) 832-4400   °[facility] ° [address], [place] (336) 992-4800   °[facility]/Dr. Osei-Bonsu ° [address] [place] [address], [place] or [address], [place] (336) 841-8500 Phone number for both [place] and [place] locations is the same.  °[facility] [address], [place] (336) 299-0000   °[facility] [address] [place] [address], [place] or [address] (336) 852-7530 °(336) 878-2260   °[facility] [address], [place] (336) 350-1642, phone; (336) 294-5005, fax Sees patients 1st and 3rd Saturday of every month.  Must not qualify for public or private insurance (i.e. Medicaid, Medicare, [place] Health Choice, Veterans' Benefits) • Household income should be no more than 200% of the poverty level •The clinic cannot treat you if you are pregnant or think you are pregnant • Sexually transmitted diseases are not treated at the clinic.  ° ° °Dental Care: °Organization         Address  Phone  Notes  °[place] [facility] [address], [place] (336) 641-6152 Accepts children up to age 21 who are enrolled in Medicaid or [place] Health Choice; pregnant women with a Medicaid card; and children who have applied for Medicaid or [place] Health Choice, but were declined, whose parents can pay a reduced fee at time of service.  °[place]  [facility] [place]  [address], [place] (336) 641-7733 Accepts children up to age 21 who are enrolled in Medicaid or [place] Health Choice; pregnant women with a Medicaid card; and children who have applied for Medicaid or [place] Health Choice, but were declined, whose parents can pay a reduced fee at time of service.  °[facility] ° [address], [place] (336) 641-4533 Patients are seen by appointment only. Walk-ins are not accepted. [facility] will see patients 18 years of age and older. °Monday - Tuesday (8am-5pm) °Most Wednesdays (8:30-5pm) °$30 per visit, cash only  °[facility] ° [address], [place] (336) 641-4533 Patients are seen by appointment only. Walk-ins are not accepted. [facility] will see patients 18 years of age and older. °One   Wednesday Evening (Monthly: Volunteer Based).  $30 per visit, cash only  °[facility]  (919) [phone] for adults; Children under age 4, call Graduate Pediatric Dentistry at ([phone]) [phone]. Children aged 4-14, please call ([phone]) [phone] to request a pediatric application. ° Dental services are provided in all areas of dental care including fillings, crowns and bridges, complete and partial dentures, implants, gum treatment, root canals, and extractions. Preventive care is also provided. Treatment is provided to both adults and children. °Patients are selected via a lottery and there is often a waiting list. °  °[facility] [address], °[place] ° (336) 763-8833 [email or web address] °  °[facility] [address], [place], [place] (336)723-1848, Ext. 123 Second and Fourth Thursday of each month, opens at 6:30 AM; Clinic ends at 9 AM.  Patients are seen on a first-come first-served basis, and a limited number are seen during each clinic.  ° °[facility] ° [address], [place], [place] (336) 723-7904    Eligibility Requirements °You must have lived in [place], [place], or [place] counties for at least the last three months. °  You cannot be eligible for state or federal sponsored healthcare insurance, including Veterans Administration, Medicaid, or Medicare. °  You generally cannot be eligible for healthcare insurance through your employer.  °  How to apply: °Eligibility screenings are held every Tuesday and Wednesday afternoon from 1:00 pm until 4:00 pm. You do not need an appointment for the interview!  °[facility] [address], [place], [place] 336-631-2330   °[facility]  336-342-8273   °[facility]  336-703-3100   °[facility]  336-570-6415   ° °Behavioral Health Resources in the Community: °Intensive Outpatient Programs °Organization         Address  Phone  Notes  °[facility] [address], [place], [place] 336-878-6098   °[facility] [address], [place], [place] 336-832-9800   °[facility] [address], [place], [place] ° 336-882-2125   °[facility] [address],  °[place], [place] 1-800-853-5163 or 336-641-4981   °Substance Abuse Resources °Organization         Address  Phone  Notes  °[facility]  336-882-2125   °[facility]  336-784-9470   °[facility]  336-285-9073   °[facility]  336-845-3988   °Residential & Outpatient Substance Abuse Program  1-800-659-3381   °Psychological Services °Organization         Address  Phone  Notes  °[facility]  336- 832-9600   °[facility]  336- 378-7881   °[facility] [address], [place] 1-800-853-5163 or 336-641-4981   ° °Mobile Crisis Teams °Organization         Address  Phone  Notes  °[facility], Mobile Crisis Care Unit  1-877-626-1772   °Assertive °[facility] ° [address]  [place], [place] 336-834-9664   °Sharon DeEsch [address] °[place] 336-554-5454   ° °Self-Help/Support Groups °Organization         Address  Phone             Notes  °[facility]  - variety of support groups  336- 373-1402 Call for more information  °Narcotics Anonymous (NA), [facility] [address], °[place]  2 meetings at this location  ° °  Residential Treatment Programs Organization         Address  Phone  Notes  [facility] [address],    [place]  1-610-960-45401-(319)515-8109   [facility]  [address], [place] [phone], [place], [place] [phone]   [facility] [address], [place] [phone] Admissions: 8am-3pm M-F  [facility] [address]    [place], [place] [phone]   [facility] [address], [place], [place] [phone]   [facility] [address]  [place], [place] [phone]   [facility] Intensive Outpatient [address], [place], [place] [phone]   [facility] [address]  [place], [place] [phone] or ([phone]   [facility] [address] [place], [place] 323-557-3220([phone]) [phone] Accepts Medicaid  [facility] [address]  [place] [phone] Substance Abuse/Addiction Treatment   [facility]         Address  Phone  Notes  [facility]  (937)559-3942(888) (539) 815-7883   Angie FavaJulie Brannon, PhD [address], [address] [place], [place]   [phone](336) (562)[phone] or (708)[phone](336) [phone]   [facility]   [address] [place], [place] [phone](336) ([phone]) [phone]   [facility] [address], [place], [place] [phone](336) [phone] Insurance/Medicaid/sponsorship through [facility] [address]                                    [place], [place] [phone](336) [phone] Therapy/tele-psych/case    [facility] [address]   [place], [place] [phone](336) [phone]    Dr. Lolly MustacheArfeen  [phone](336) [phone]   [facility]  [employer] [facility] 1) [address],  2) [address], [place] 3)  [address], [place] [phone](336) [phone](336) [phone]  [phone](336) [phone]   [facility] Child Abuse Hot[facility] (814) 144-5647(336) (334) 766-9665 or 270-718-6646(336) (346)687-9490 (After Hours)      Increase your fluid intake (ie:  Gatoraide) for the next few days, as discussed.  Eat a bland diet and advance to your regular diet slowly as you can tolerate it.   Avoid full strength juices, as well as milk and milk products until your diarrhea has resolved.   Call your regular medical doctor and the GI doctor on Monday to schedule a follow up appointment this week.  Return to the Emergency Department immediately if not improving (or even worsening) despite taking the medicines as prescribed, any black or bloody stool or vomit, if you develop a fever over "101," or for any other concerns.

## 2014-11-22 LAB — GI PATHOGEN PANEL BY PCR, STOOL
CRYPTOSPORIDIUM BY PCR: NOT DETECTED
Campylobacter by PCR: NOT DETECTED
E COLI (ETEC) LT/ST: NOT DETECTED
E coli (STEC): NOT DETECTED
E coli 0157 by PCR: NOT DETECTED
Norovirus GI/GII: NOT DETECTED
Rotavirus A by PCR: NOT DETECTED
SALMONELLA BY PCR: NOT DETECTED
SHIGELLA BY PCR: NOT DETECTED

## 2014-12-09 ENCOUNTER — Other Ambulatory Visit (HOSPITAL_COMMUNITY): Payer: Self-pay | Admitting: Family Medicine

## 2014-12-09 DIAGNOSIS — R197 Diarrhea, unspecified: Secondary | ICD-10-CM

## 2014-12-14 ENCOUNTER — Ambulatory Visit (HOSPITAL_COMMUNITY)
Admission: RE | Admit: 2014-12-14 | Discharge: 2014-12-14 | Disposition: A | Discharge status: Home / Self Care | Payer: Medicare Other | Source: Ambulatory Visit | Attending: Family Medicine | Admitting: Family Medicine

## 2015-09-07 IMAGING — CR DG CHEST 1V PORT
1 series · 1 of 1 positions shown · non-contrast
Comparison: 07/02/2014 and earlier priors

CLINICAL DATA: Atelectasis. History of bronchoscopy with left
diaphragmatic hernia repair.

EXAM:
PORTABLE CHEST - 1 VIEW

[AP]
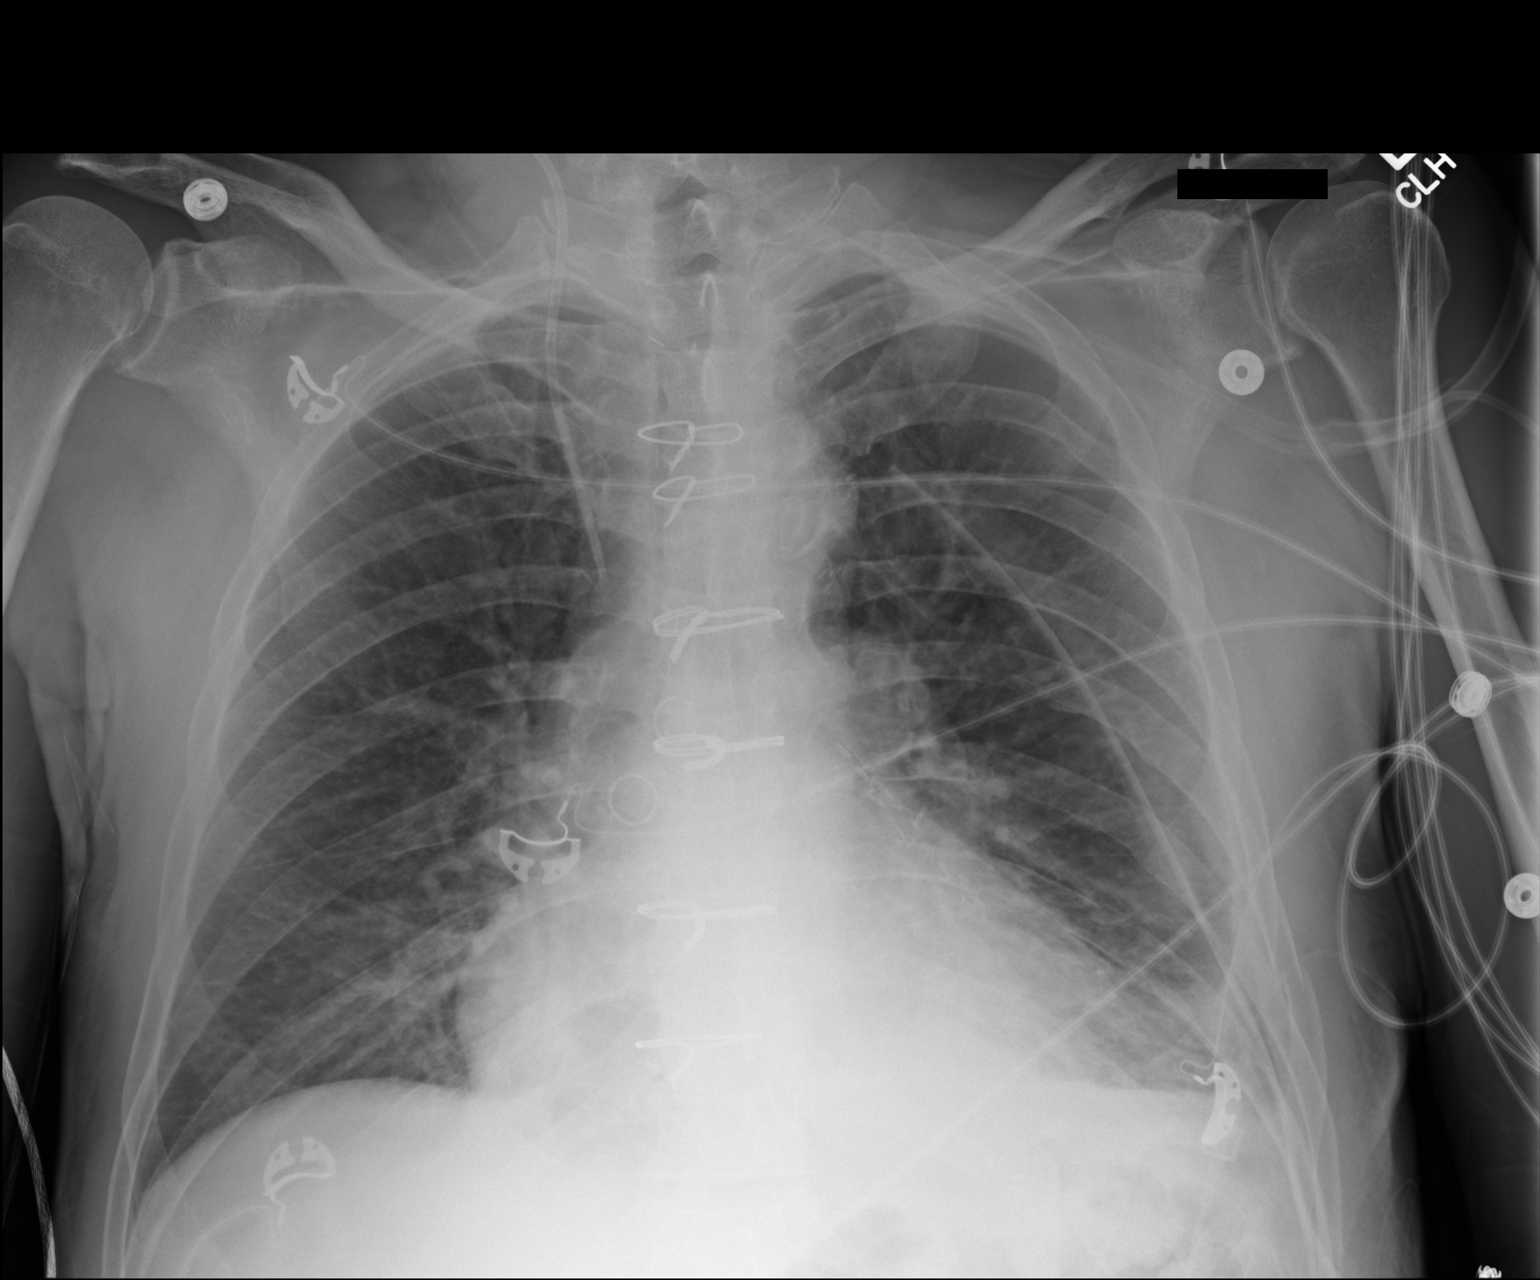

[1 of 1 positions shown; findings below may reference images not displayed]

FINDINGS: Status post median sternotomy for CABG. Stable cardiomegaly. Right
IJ central venous catheter and left-sided chest tube replaced.
Pulmonary vascularity appears within normal limits. There is streaky
atelectasis at the lung bases. Negative for pneumothorax. No visible
pleural effusion.

There is a slight lead displaced acute appearing fracture of the
lateral arc of the left seventh rib.
IMPRESSION: 1. Acute left seventh rib fracture.
2. Left chest tube in place, without pneumothorax.
3. Mild bibasilar atelectasis, without significant change.
These results will be called to the ordering clinician or
representative by the Radiologist Assistant, and communication
documented in the PACS or zVision Dashboard.

## 2015-09-08 IMAGING — DX DG ABDOMEN 2V
2 series · 2 of 2 positions shown · non-contrast
Comparison: Abdominal CT scan June 29, 2014 and June 28, 2014.

CLINICAL DATA: Abdominal and chest soreness and atelectasis with
history of ileus; status post hernia repair ; history of COPD,
diabetes, and CABG.

EXAM:
ABDOMEN - 2 VIEW

[abdomen erect]
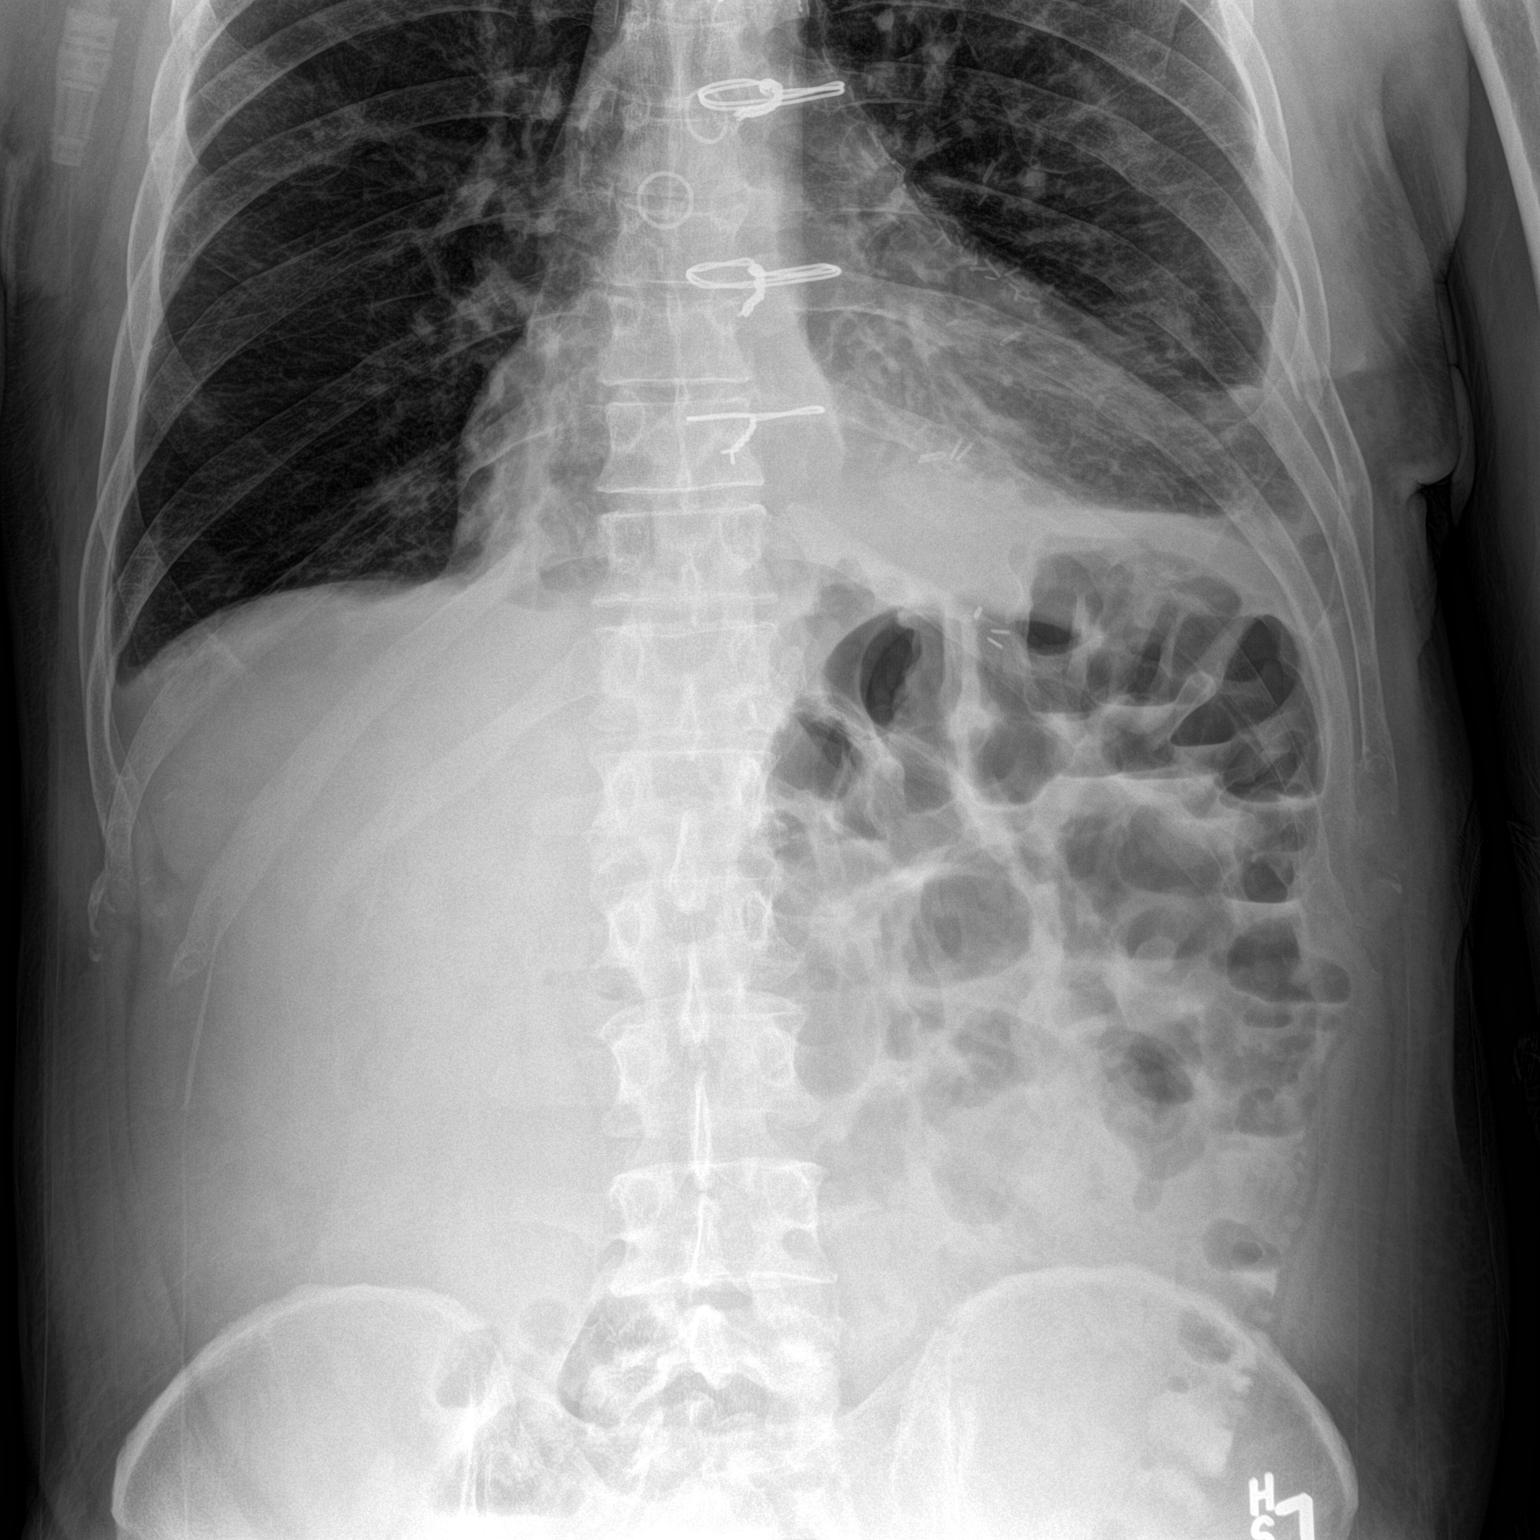

[abdomen supine]
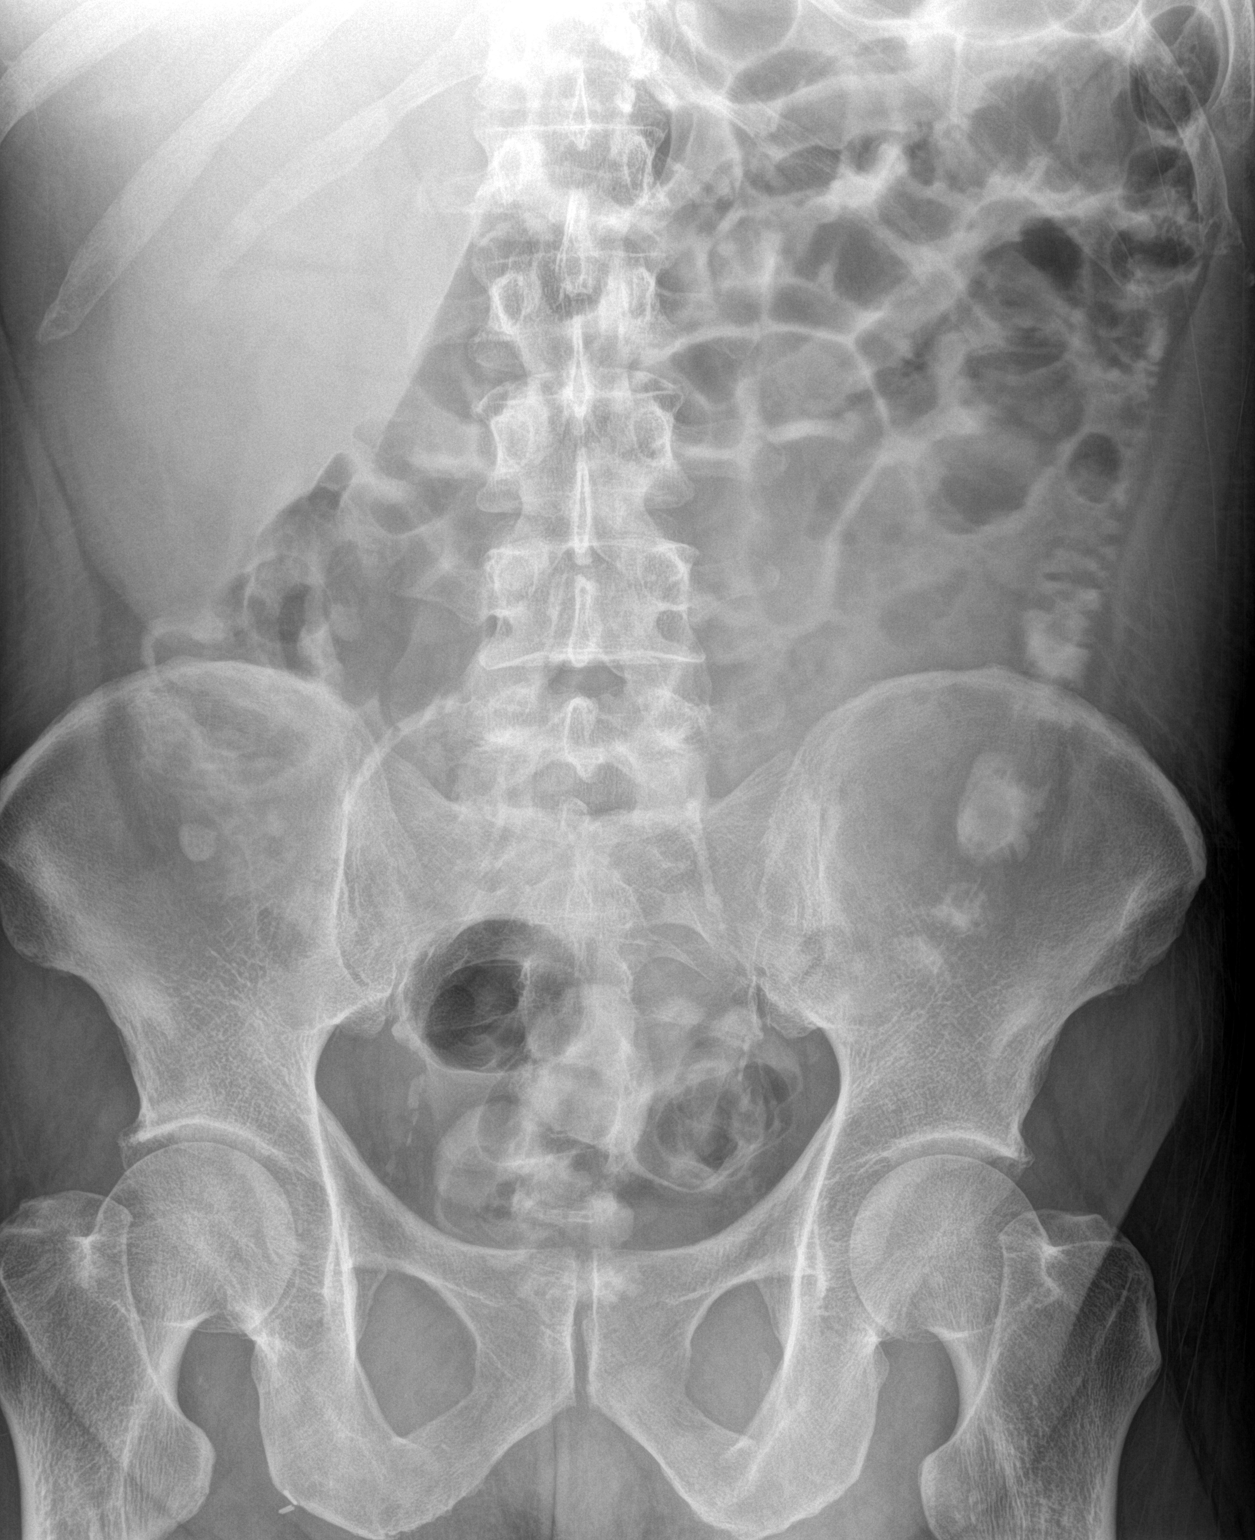

[2 of 2 positions shown; findings below may reference images not displayed]

FINDINGS: There is a moderate amount of mildly distended gas-filled bowel in
the left mid and upper abdomen. This appears reflect largely small
bowel loops. There is a smaller amount of gas as well as radiodense
contrast in the descending colon and rectosigmoid. There is no free
extraluminal gas. There is minimal atelectasis at the left lung
base.
IMPRESSION: The bowel gas pattern is consistent with an ileus. There is has been
further transit of contrast through the bowel since the abdominal CT
scan June 28, 2014.

## 2015-09-09 IMAGING — CR DG ABDOMEN 2V
2 series · 2 of 2 positions shown · non-contrast
Comparison: Supine and upright abdominal films July 04, 2014

CLINICAL DATA: Postoperative ileus

EXAM:
ABDOMEN - 2 VIEW

[w abdomen upright]
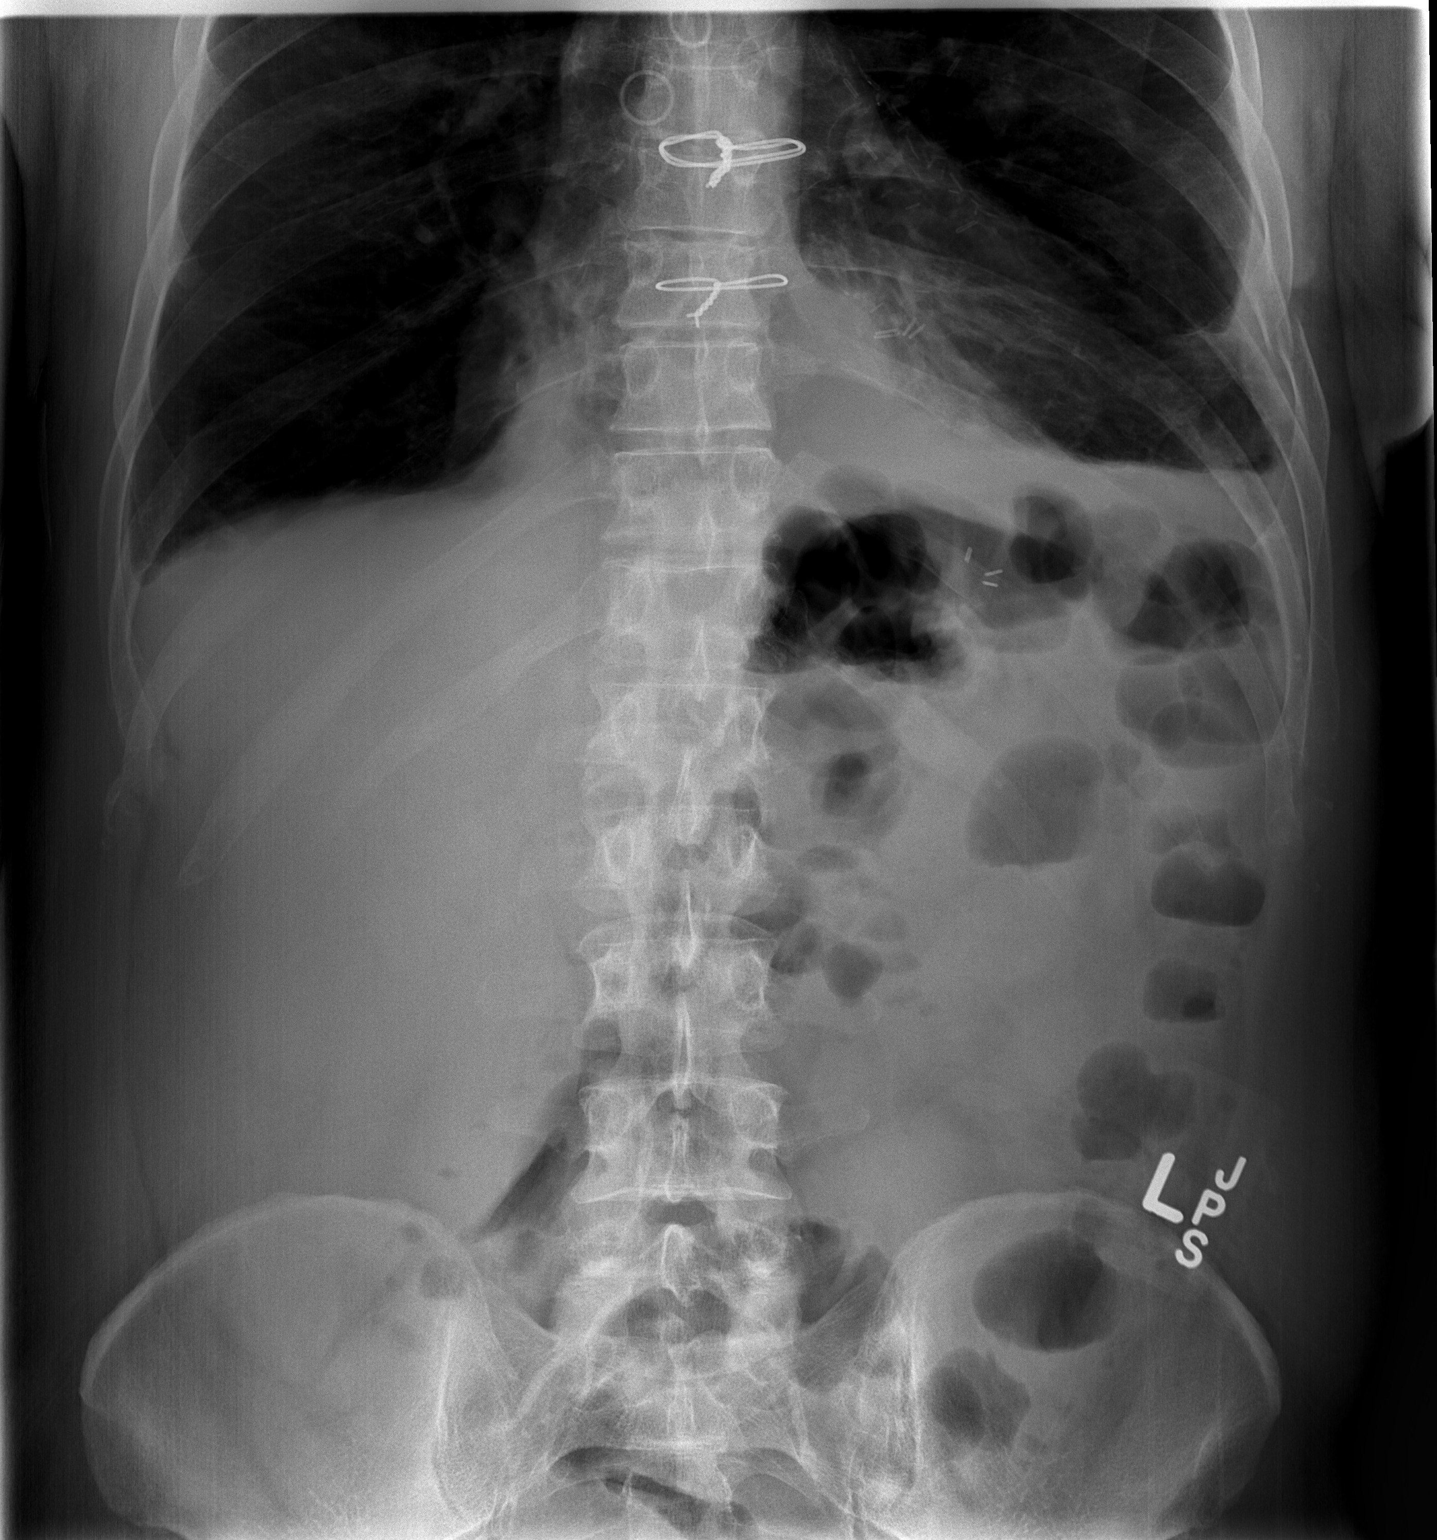

[t abdomen supine]
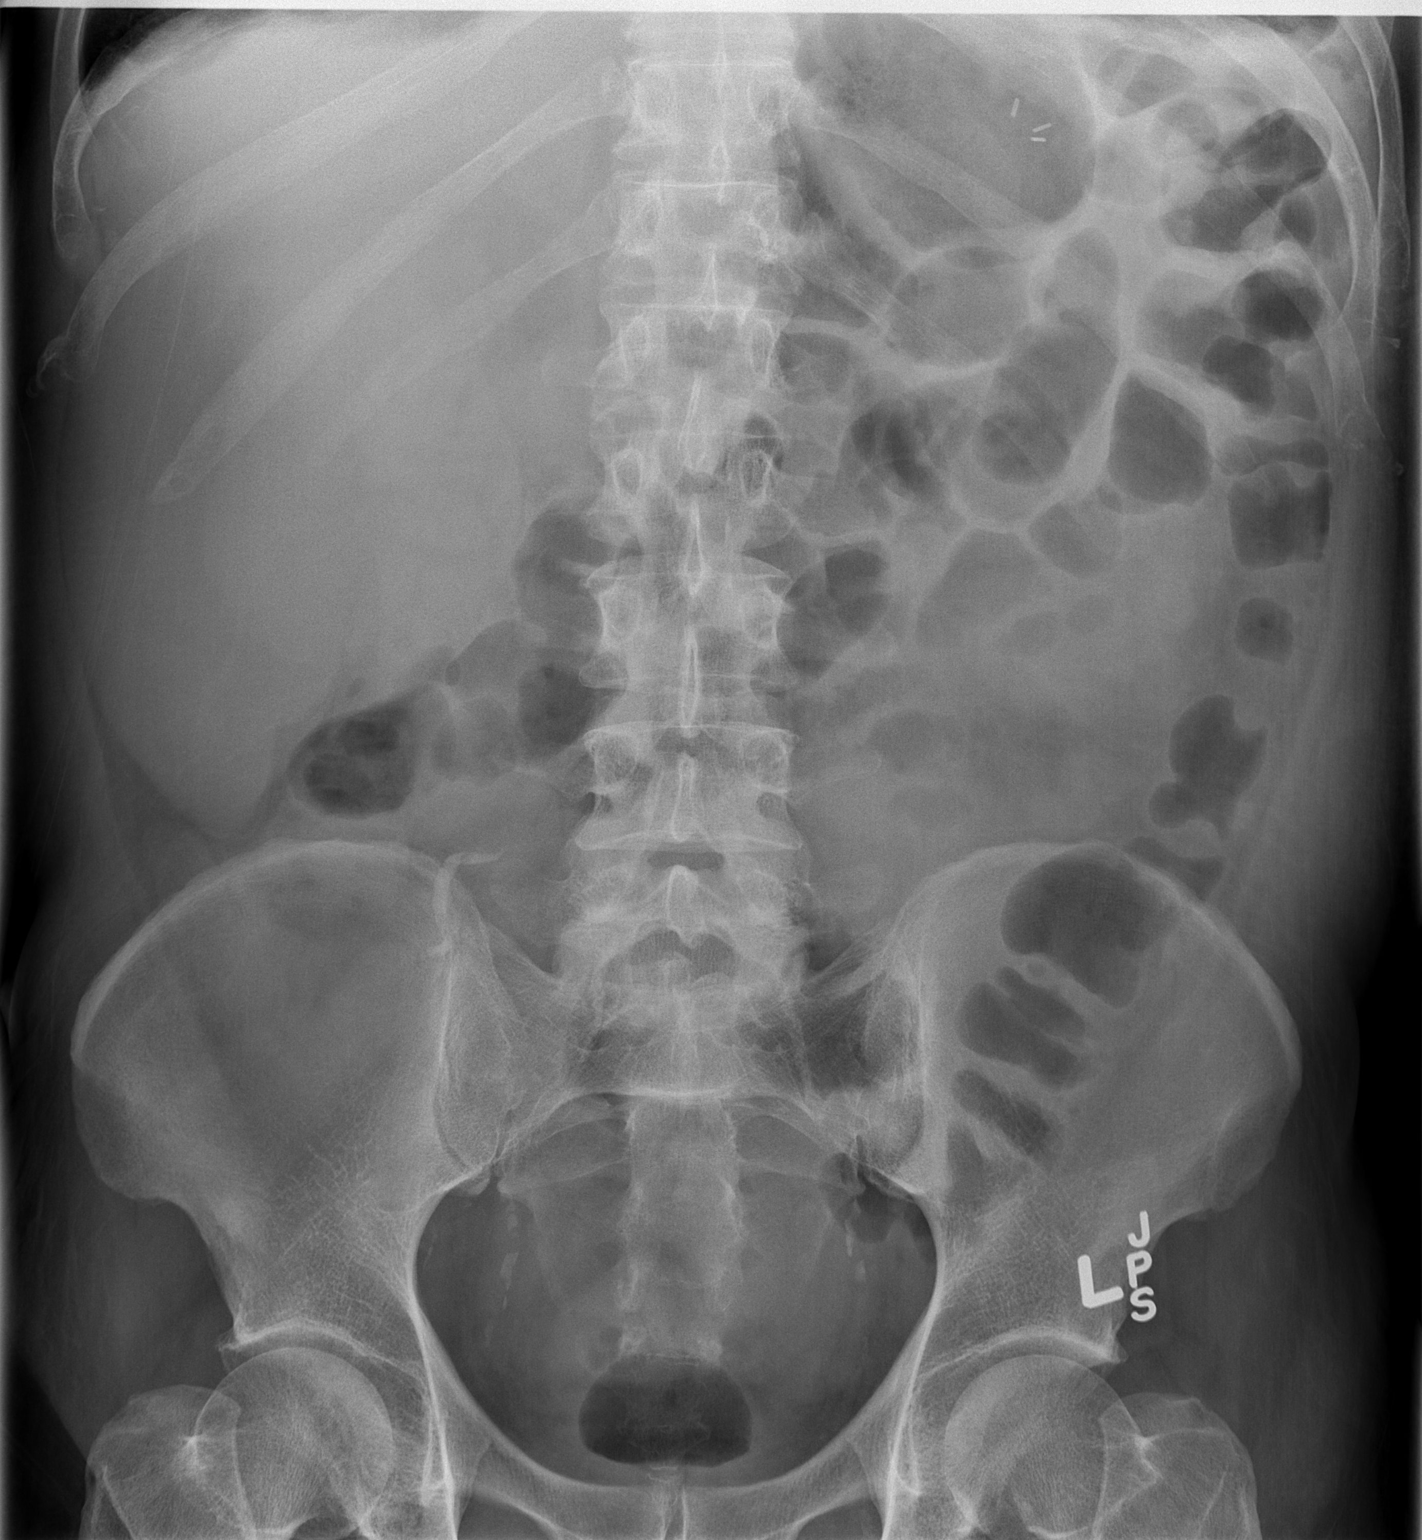

[2 of 2 positions shown; findings below may reference images not displayed]

FINDINGS: There has been interval clearing of the colon of the previously
administered oral contrast. The degree of distention of small-bowel
loops has decreased and the number of gas-filled loops has
decreased. No free extraluminal gas collections are demonstrated.
Small bilateral pleural effusions are present. There is bibasilar
atelectasis medially.
IMPRESSION: Improving appearance of the bowel gas pattern consistent with
resolving ileus. There is bibasilar atelectasis.

## 2015-12-01 ENCOUNTER — Other Ambulatory Visit (HOSPITAL_COMMUNITY): Payer: Self-pay | Admitting: Family Medicine

## 2015-12-01 ENCOUNTER — Ambulatory Visit (HOSPITAL_COMMUNITY)
Admission: RE | Admit: 2015-12-01 | Discharge: 2015-12-01 | Disposition: A | Discharge status: Home / Self Care | Payer: Medicare Other | Source: Ambulatory Visit | Attending: Family Medicine | Admitting: Family Medicine

## 2015-12-01 DIAGNOSIS — M50323 Other cervical disc degeneration at C6-C7 level: Secondary | ICD-10-CM | POA: Diagnosis not present

## 2015-12-01 DIAGNOSIS — M50322 Other cervical disc degeneration at C5-C6 level: Secondary | ICD-10-CM | POA: Diagnosis not present

## 2015-12-01 DIAGNOSIS — M5033 Other cervical disc degeneration, cervicothoracic region: Secondary | ICD-10-CM | POA: Insufficient documentation

## 2015-12-01 DIAGNOSIS — M5412 Radiculopathy, cervical region: Secondary | ICD-10-CM | POA: Insufficient documentation

## 2016-06-11 ENCOUNTER — Other Ambulatory Visit: Payer: Self-pay

## 2016-06-11 ENCOUNTER — Encounter (HOSPITAL_COMMUNITY)
Admission: RE | Admit: 2016-06-11 | Discharge: 2016-06-11 | Disposition: A | Discharge status: Home / Self Care | Payer: Medicare Other | Source: Ambulatory Visit | Attending: General Surgery | Admitting: General Surgery

## 2016-06-11 ENCOUNTER — Encounter (HOSPITAL_COMMUNITY): Payer: Self-pay

## 2016-06-11 DIAGNOSIS — E7800 Pure hypercholesterolemia, unspecified: Secondary | ICD-10-CM | POA: Diagnosis not present

## 2016-06-11 DIAGNOSIS — Z79899 Other long term (current) drug therapy: Secondary | ICD-10-CM | POA: Diagnosis not present

## 2016-06-11 DIAGNOSIS — Z951 Presence of aortocoronary bypass graft: Secondary | ICD-10-CM | POA: Diagnosis not present

## 2016-06-11 DIAGNOSIS — Z7984 Long term (current) use of oral hypoglycemic drugs: Secondary | ICD-10-CM | POA: Diagnosis not present

## 2016-06-11 DIAGNOSIS — E119 Type 2 diabetes mellitus without complications: Secondary | ICD-10-CM | POA: Diagnosis not present

## 2016-06-11 DIAGNOSIS — Z7982 Long term (current) use of aspirin: Secondary | ICD-10-CM | POA: Diagnosis not present

## 2016-06-11 DIAGNOSIS — I2510 Atherosclerotic heart disease of native coronary artery without angina pectoris: Secondary | ICD-10-CM | POA: Diagnosis not present

## 2016-06-11 DIAGNOSIS — I10 Essential (primary) hypertension: Secondary | ICD-10-CM | POA: Diagnosis not present

## 2016-06-11 DIAGNOSIS — K4090 Unilateral inguinal hernia, without obstruction or gangrene, not specified as recurrent: Secondary | ICD-10-CM | POA: Diagnosis present

## 2016-06-11 DIAGNOSIS — J449 Chronic obstructive pulmonary disease, unspecified: Secondary | ICD-10-CM | POA: Diagnosis not present

## 2016-06-11 HISTORY — DX: Unspecified osteoarthritis, unspecified site: M19.90

## 2016-06-11 HISTORY — DX: Personal history of urinary calculi: Z87.442

## 2016-06-11 LAB — CBC WITH DIFFERENTIAL/PLATELET
BASOS PCT: 1 %
Basophils Absolute: 0.1 10*3/uL (ref 0.0–0.1)
Eosinophils Absolute: 0.5 10*3/uL (ref 0.0–0.7)
Eosinophils Relative: 5 %
HCT: 44.6 % (ref 39.0–52.0)
Hemoglobin: 15 g/dL (ref 13.0–17.0)
LYMPHS ABS: 3.6 10*3/uL (ref 0.7–4.0)
Lymphocytes Relative: 34 %
MCH: 31.4 pg (ref 26.0–34.0)
MCHC: 33.6 g/dL (ref 30.0–36.0)
MCV: 93.5 fL (ref 78.0–100.0)
MONO ABS: 1 10*3/uL (ref 0.1–1.0)
MONOS PCT: 9 %
Neutro Abs: 5.3 10*3/uL (ref 1.7–7.7)
Neutrophils Relative %: 51 %
Platelets: 229 10*3/uL (ref 150–400)
RBC: 4.77 MIL/uL (ref 4.22–5.81)
RDW: 13.7 % (ref 11.5–15.5)
WBC: 10.5 10*3/uL (ref 4.0–10.5)

## 2016-06-11 LAB — BASIC METABOLIC PANEL
Anion gap: 4 — ABNORMAL LOW (ref 5–15)
BUN: 26 mg/dL — AB (ref 6–20)
CALCIUM: 9.3 mg/dL (ref 8.9–10.3)
CO2: 30 mmol/L (ref 22–32)
CREATININE: 1.15 mg/dL (ref 0.61–1.24)
Chloride: 104 mmol/L (ref 101–111)
GFR calc Af Amer: >60 mL/min (ref >60)
GFR calc non Af Amer: >60 mL/min (ref >60)
GLUCOSE: 94 mg/dL (ref 65–99)
Potassium: 3.9 mmol/L (ref 3.5–5.1)
Sodium: 138 mmol/L (ref 135–145)

## 2016-06-11 NOTE — Patient Instructions (Signed)
Mark Rios  06/11/2016     @PREFPERIOPPHARMACY @   Your procedure is scheduled on  06/13/2016   Report to [facility] at  815  A.M.  Call this number if you have problems the morning of surgery:  [phone]   Remember:  Do not eat food or drink liquids after midnight.  Take these medicines the morning of surgery with A SIP OF WATER  Coreg, lisinopril, oxycodone. Take your nebulizer before you come. Bring your rescue inhaler with you. DO NOT take any medicines for diabetes the morning of your procedure.   Do not wear jewelry, make-up or nail polish.  Do not wear lotions, powders, or perfumes, or deoderant.  Do not shave 48 hours prior to surgery.  Men may shave face and neck.  Do not bring valuables to the hospital.  [facility] is not responsible for any belongings or valuables.  Contacts, dentures or bridgework may not be worn into surgery.  Leave your suitcase in the car.  After surgery it may be brought to your room.  For patients admitted to the hospital, discharge time will be determined by your treatment team.  Patients discharged the day of surgery will not be allowed to drive home.   Name and phone number of your driver:    family Special instructions:  none  Please read over the following fact sheets that you were given. Anesthesia Post-op Instructions and Care and Recovery After Surgery      Open Hernia Repair Open hernia repair is surgery to fix a hernia. A hernia occurs when an internal organ or tissue pushes out through a weak spot in the abdominal wall muscles. Hernias commonly occur in the groin and around the navel. Most hernias tend to get worse over time. Surgery is often done to prevent the hernia from getting bigger, becoming uncomfortable, or becoming an emergency. Emergency surgery may be needed if abdominal contents get stuck in the opening (incarcerated hernia) or the blood supply gets cut off (strangulated hernia). In an open repair, a  large cut (incision) is made in the abdomen to perform the surgery. LET [facility] CARE PROVIDER KNOW ABOUT:  Any allergies you have.  All medicines you are taking, including vitamins, herbs, eye drops, creams, and over-the-counter medicines.  Previous problems you or members of your family have had with the use of anesthetics.  Any blood disorders you have.  Previous surgeries you have had.  Medical conditions you have. RISKS AND COMPLICATIONS Generally, this is a safe procedure. However, as with any procedure, complications can occur. Possible complications include:  Infection.  Bleeding.  Nerve injury.  Chronic pain.  The hernia can come back.  Injury to the intestines. BEFORE THE PROCEDURE  Ask your health care provider about changing or stopping any regular medicines. Avoid taking aspirin or blood thinners as directed by your health care provider.  Do noteat or drink anything after midnight the night before surgery.  If you smoke, do not smoke for at least 2 weeks before your surgery.  Do not drink alcohol the day before your surgery.  Let your health care provider know if you develop a cold or any infection before your surgery.  Arrange for someone to drive you home after the procedure or after your hospital stay. Also arrange for someone to help you with activities during recovery. PROCEDURE   Small monitors will be put on your body. They are used to check your  heart, blood pressure, and oxygen level.   An IV access tube will be put into one of your veins. Medicine will be able to flow directly into your body through this IV tube.   You might be given a medicine to help you relax (sedative).   You will be given a medicine to make you sleep (general anesthetic). A breathing tube may be placed into your lungs during the procedure.  A cut (incision) is made over the hernia defect, and the contents are pushed back into the abdomen.  If the hernia is small,  stitches may be used to bring the muscle edges back together.  Typically, a surgeon will place a mesh patch made of man-made material (synthetic) to cover the defect. The mesh is sewn to healthy muscle. This reduces the risk of the hernia coming back.  The tissue and skin over the hernia are then closed with stitches or staples.  If the hernia was large, a drain may be left in place to collect excess fluid where the hernia used to be.  Bandages (dressings) are used to cover the incision. AFTER THE PROCEDURE  You will be taken to a recovery area where your progress will be monitored.  If the hernia was small or in the groin (inguinal) region, you will likely be allowed to go home once you are awake, stable, and taking fluids well.  If the hernia was large, you may have to wait for your bowel function to return. You may need to stay in the hospital for 2-3 days until you can eat and your pain is controlled. A drain may be left in place for 5-7 days. You will be taught how to care for the drain.   This information is not intended to replace advice given to you by your health care provider. Make sure you discuss any questions you have with your health care provider.   Document Released: 01/29/2001 Document Revised: 05/26/2013 Document Reviewed: 03/17/2013 Elsevier Interactive Patient Education 2016 [employer]  Open Hernia Repair, Care After These instructions give you information about caring for yourself after your procedure. Your doctor may also give you more specific instructions. Call your doctor if you have any problems or questions after your procedure. HOME CARE  Keep the cut (incision) area clean and dry. You may gently wash the incision area with soap and water 48 hours after surgery. To dry the incision area, gently blot or dab it.  Do not take baths, swim, or use a hot tub for 10 days or until your doctor approves.  Change bandages (dressings) as told by your doctor.  Check  your incision area every day for signs of infection. Watch for:  Redness, swelling, or pain.  Fluid , blood, or pus.  Eat plenty of fruits and vegetables. This helps to prevent constipation.  Drink enough fluid to keep your pee (urine) clear or pale yellow. This also helps to prevent constipation.  Do not drive or operate heavy machinery until your doctor says it is okay.  Do not lift anything that is heavier than 10 lb (4.5 kg) until your doctor approves.  Do not play contact sports for 4 weeks or until your doctor approves.  Take medicines only as told by your doctor.  Keep all follow-up visits as told by your doctor. This is important. Ask your doctor when to make an appointment to have your stitches (sutures) or staples removed. GET HELP IF:  The incision is bleeding more than before.  You have blood in your poop (stool).  The incision hurts more than before.  You have redness, swelling, or pain in your incision area.  You have fluid, blood, or pus coming from your incision.  You have a fever.  You notice a bad smell coming from the incision area or the dressing. GET HELP RIGHT AWAY IF:  You have a rash.  Your chest hurts.  You are short of breath.  You feel light-headed.  You feel weak and dizzy (feel faint).   This information is not intended to replace advice given to you by your health care provider. Make sure you discuss any questions you have with your health care provider.   Document Released: 08/26/2014 Document Reviewed: 08/26/2014 Elsevier Interactive Patient Education 2016 Elsevier [employer] PATIENT INSTRUCTIONS POST-ANESTHESIA  IMMEDIATELY FOLLOWING SURGERY:  Do not drive or operate machinery for the first twenty four hours after surgery.  Do not make any important decisions for twenty four hours after surgery or while taking narcotic pain medications or sedatives.  If you develop intractable nausea and vomiting or a severe headache please notify your  doctor immediately.  FOLLOW-UP:  Please make an appointment with your surgeon as instructed. You do not need to follow up with anesthesia unless specifically instructed to do so.  WOUND CARE INSTRUCTIONS (if applicable):  Keep a dry clean dressing on the anesthesia/puncture wound site if there is drainage.  Once the wound has quit draining you may leave it open to air.  Generally you should leave the bandage intact for twenty four hours unless there is drainage.  If the epidural site drains for more than 36-48 hours please call the anesthesia department.  QUESTIONS?:  Please feel free to call your physician or the hospital operator if you have any questions, and they will be happy to assist you.

## 2016-06-11 NOTE — H&P (Signed)
  NTS SOAP Note  Vital Signs:  Vitals as of: 06/11/2016: Systolic 118: Diastolic 70: Heart Rate 82: Temp 98.39F (Temporal): Height 155ft 7in: Weight 148Lbs 0 Ounces: Pain Level 8: BMI 23.18   BMI : 23.18 kg/m2  Subjective: This 65 year old male presents for of a left inguinal hernia.  Has been present for the past few weeks.  Made worse with straining.  It now sticks out constantly, resolves when lying down.  Pain 6/10 when sticking out.  No nausea, vomiting.  Bowel movements wnl.  Referred by Dr. Sudie BaileyKnowlton.  Review of Symptoms:  Constitutional:negative Head:negative Eyes:negative Nose/Mouth/Throat:negative Cardiovascular:negative Respiratory:negative Gastrointestinnegative Genitourinary:frequency Musculoskeletal:negative Skin:negative Hematolgic/Lymphatic:negative Allergic/Immunologic:negative   Past Medical History:Reviewed  Past Medical History  Surgical History: RIH, umbilical herniorrhaphy 2016 Medical Problems: HTN, NIDDM, high cholesterol Allergies: nkda Medications: asa, lisinopril, simvastatin, carvedilol, metformin   Social History:Reviewed  Social History  Preferred Language: English Race:  White Ethnicity: Not Hispanic / Latino Age: [age] year Marital Status:  M Alcohol: no   Smoking Status: Current every day smoker reviewed on 06/11/2016 Started Date:  Packs per week:  Functional Status reviewed on 06/11/2016 ------------------------------------------------ Bathing: Normal Cooking: Normal Dressing: Normal Driving: Normal Eating: Normal Managing Meds: Normal Oral Care: Normal Shopping: Normal Toileting: Normal Transferring: Normal Walking: Normal Cognitive Status reviewed on 06/11/2016 ------------------------------------------------ Attention: Normal Decision Making: Normal Language: Normal Memory: Normal Motor: Normal Perception: Normal Problem Solving: Normal Visual and Spatial: Normal   Family  History:Reviewed  Family Health History Family History is Unknown    Objective Information: General:Well appearing, well nourished in no distress. Skin:no rash or prominent lesions Head:Atraumatic; no masses; no abnormalities Neck:Supple without lymphadenopathy.  Heart:RRR, no murmur or gallop.  Normal S1, S2.  No S3, S4.  Lungs:CTA bilaterally, no wheezes, rhonchi, rales.  Breathing unlabored. Abdomen:Soft, NT/ND, normal bowel sounds, no HSM, no masses.  No peritoneal signs.  Reducible left inguinal hernia. ZO:[id number]:unremarkable Dr. Michelle NasutiKnowlton's notes reviewed. Assessment:Left inguinal hernia  Diagnoses: 550.90  K40.90 Unilateral or unspecified inguinal hernia, without mention of obstruction or gangrene (not specified as recurrent)  Procedures: 99214 - OFFICE OUTPATIENT VISIT 25 MINUTES    Plan:  Scheduled for left inguinal herniorrhaphy with mesh on 06/12/16.   Patient Education:Alternative treatments to surgery were discussed with patient (and family).Risks and benefits  of procedure including bleeding, infection, mesh use, and recurrence of the hernia were fully explained to the patient (and family) who gave informed consent. Patient/family questions were addressed.  Follow-up:Pending Surgery

## 2016-06-12 ENCOUNTER — Encounter (HOSPITAL_COMMUNITY)
Admission: RE | Disposition: A | Discharge status: Home / Self Care | Payer: Self-pay | Source: Ambulatory Visit | Attending: General Surgery

## 2016-06-12 ENCOUNTER — Encounter (HOSPITAL_COMMUNITY): Payer: Self-pay | Admitting: *Deleted

## 2016-06-12 ENCOUNTER — Ambulatory Visit (HOSPITAL_COMMUNITY): Payer: Medicare Other | Admitting: Anesthesiology

## 2016-06-12 ENCOUNTER — Ambulatory Visit (HOSPITAL_COMMUNITY)
Admission: RE | Admit: 2016-06-12 | Discharge: 2016-06-12 | Disposition: A | Discharge status: Home / Self Care | Payer: Medicare Other | Source: Ambulatory Visit | Attending: General Surgery | Admitting: General Surgery

## 2016-06-12 DIAGNOSIS — K4090 Unilateral inguinal hernia, without obstruction or gangrene, not specified as recurrent: Secondary | ICD-10-CM | POA: Insufficient documentation

## 2016-06-12 DIAGNOSIS — E7800 Pure hypercholesterolemia, unspecified: Secondary | ICD-10-CM | POA: Insufficient documentation

## 2016-06-12 DIAGNOSIS — I10 Essential (primary) hypertension: Secondary | ICD-10-CM | POA: Insufficient documentation

## 2016-06-12 DIAGNOSIS — I2510 Atherosclerotic heart disease of native coronary artery without angina pectoris: Secondary | ICD-10-CM | POA: Insufficient documentation

## 2016-06-12 DIAGNOSIS — J449 Chronic obstructive pulmonary disease, unspecified: Secondary | ICD-10-CM | POA: Insufficient documentation

## 2016-06-12 DIAGNOSIS — E119 Type 2 diabetes mellitus without complications: Secondary | ICD-10-CM | POA: Diagnosis not present

## 2016-06-12 DIAGNOSIS — Z7984 Long term (current) use of oral hypoglycemic drugs: Secondary | ICD-10-CM | POA: Insufficient documentation

## 2016-06-12 DIAGNOSIS — Z7982 Long term (current) use of aspirin: Secondary | ICD-10-CM | POA: Insufficient documentation

## 2016-06-12 DIAGNOSIS — Z79899 Other long term (current) drug therapy: Secondary | ICD-10-CM | POA: Insufficient documentation

## 2016-06-12 DIAGNOSIS — Z951 Presence of aortocoronary bypass graft: Secondary | ICD-10-CM | POA: Insufficient documentation

## 2016-06-12 HISTORY — PX: INGUINAL HERNIA REPAIR: SHX194

## 2016-06-12 LAB — GLUCOSE, CAPILLARY
GLUCOSE-CAPILLARY: 94 mg/dL (ref 65–99)
GLUCOSE-CAPILLARY: 122 mg/dL — AB (ref 65–99)

## 2016-06-12 SURGERY — REPAIR, HERNIA, INGUINAL, ADULT
Anesthesia: General | Site: Groin | Laterality: Left

## 2016-06-12 MED ORDER — CEFAZOLIN SODIUM-DEXTROSE 2-4 GM/100ML-% IV SOLN
2.0000 g | INTRAVENOUS | Status: AC
  Administered 2016-06-12: 2 g via INTRAVENOUS
  Filled 2016-06-12: qty 100

## 2016-06-12 MED ORDER — EPHEDRINE SULFATE 50 MG/ML IJ SOLN
INTRAMUSCULAR | Status: AC
Start: 2016-06-12 — End: 2016-06-12
  Filled 2016-06-12: qty 1

## 2016-06-12 MED ORDER — FENTANYL CITRATE (PF) 100 MCG/2ML IJ SOLN
INTRAMUSCULAR | Status: AC
  Filled 2016-06-12: qty 2

## 2016-06-12 MED ORDER — ONDANSETRON HCL 4 MG/2ML IJ SOLN
INTRAMUSCULAR | Status: AC
  Filled 2016-06-12: qty 2

## 2016-06-12 MED ORDER — SODIUM CHLORIDE 0.9 % IJ SOLN
INTRAMUSCULAR | Status: AC
Start: 2016-06-12 — End: 2016-06-12
  Filled 2016-06-12: qty 10

## 2016-06-12 MED ORDER — LIDOCAINE HCL (CARDIAC) 10 MG/ML IV SOLN
INTRAVENOUS | Status: DC | PRN
  Administered 2016-06-12: 28 mg via INTRAVENOUS

## 2016-06-12 MED ORDER — SODIUM CHLORIDE 0.9 % IR SOLN
Status: DC | PRN
  Administered 2016-06-12: 1000 mL

## 2016-06-12 MED ORDER — MIDAZOLAM HCL 2 MG/2ML IJ SOLN
1.0000 mg | INTRAMUSCULAR | Status: DC | PRN
  Administered 2016-06-12: 2 mg via INTRAVENOUS

## 2016-06-12 MED ORDER — GLYCOPYRROLATE 0.2 MG/ML IJ SOLN
0.2000 mg | Freq: Once | INTRAMUSCULAR | Status: AC | PRN
  Administered 2016-06-12: 0.2 mg via INTRAVENOUS

## 2016-06-12 MED ORDER — KETOROLAC TROMETHAMINE 30 MG/ML IJ SOLN
30.0000 mg | Freq: Once | INTRAMUSCULAR | Status: AC
  Administered 2016-06-12: 30 mg via INTRAVENOUS

## 2016-06-12 MED ORDER — KETOROLAC TROMETHAMINE 30 MG/ML IJ SOLN
INTRAMUSCULAR | Status: AC
  Filled 2016-06-12: qty 1

## 2016-06-12 MED ORDER — PROPOFOL 10 MG/ML IV BOLUS
INTRAVENOUS | Status: DC | PRN
  Administered 2016-06-12: 100 mg via INTRAVENOUS

## 2016-06-12 MED ORDER — EPHEDRINE SULFATE 50 MG/ML IJ SOLN
INTRAMUSCULAR | Status: DC | PRN
  Administered 2016-06-12: 5 mg via INTRAVENOUS

## 2016-06-12 MED ORDER — FENTANYL CITRATE (PF) 100 MCG/2ML IJ SOLN
INTRAMUSCULAR | Status: DC | PRN
  Administered 2016-06-12 (×2): 25 ug via INTRAVENOUS

## 2016-06-12 MED ORDER — BUPIVACAINE LIPOSOME 1.3 % IJ SUSP
INTRAMUSCULAR | Status: AC
  Filled 2016-06-12: qty 20

## 2016-06-12 MED ORDER — PHENYLEPHRINE 40 MCG/ML (10ML) SYRINGE FOR IV PUSH (FOR BLOOD PRESSURE SUPPORT)
PREFILLED_SYRINGE | INTRAVENOUS | Status: AC
  Filled 2016-06-12: qty 10

## 2016-06-12 MED ORDER — GLYCOPYRROLATE 0.2 MG/ML IJ SOLN
INTRAMUSCULAR | Status: AC
  Filled 2016-06-12: qty 1

## 2016-06-12 MED ORDER — ONDANSETRON HCL 4 MG/2ML IJ SOLN
4.0000 mg | Freq: Once | INTRAMUSCULAR | Status: AC
  Administered 2016-06-12: 4 mg via INTRAVENOUS

## 2016-06-12 MED ORDER — OXYCODONE-ACETAMINOPHEN 7.5-325 MG PO TABS: 1.0000 | ORAL_TABLET | ORAL | 0 refills | Status: DC | PRN

## 2016-06-12 MED ORDER — LACTATED RINGERS IV SOLN
INTRAVENOUS | Status: DC
  Administered 2016-06-12: 09:00:00 via INTRAVENOUS

## 2016-06-12 MED ORDER — HYDROMORPHONE HCL 1 MG/ML IJ SOLN
0.2500 mg | INTRAMUSCULAR | Status: DC | PRN
  Administered 2016-06-12 (×2): 0.5 mg via INTRAVENOUS
  Filled 2016-06-12 (×2): qty 0.5

## 2016-06-12 MED ORDER — PHENYLEPHRINE HCL 10 MG/ML IJ SOLN
INTRAMUSCULAR | Status: DC | PRN
  Administered 2016-06-12: 40 ug via INTRAVENOUS

## 2016-06-12 MED ORDER — BUPIVACAINE LIPOSOME 1.3 % IJ SUSP
INTRAMUSCULAR | Status: DC | PRN
  Administered 2016-06-12: 20 mL

## 2016-06-12 MED ORDER — FENTANYL CITRATE (PF) 100 MCG/2ML IJ SOLN
25.0000 ug | INTRAMUSCULAR | Status: AC | PRN
  Administered 2016-06-12 (×2): 25 ug via INTRAVENOUS

## 2016-06-12 MED ORDER — MIDAZOLAM HCL 2 MG/2ML IJ SOLN
INTRAMUSCULAR | Status: AC
  Filled 2016-06-12: qty 2

## 2016-06-12 SURGICAL SUPPLY — 37 items
ADH SKN CLS APL DERMABOND .7 (GAUZE/BANDAGES/DRESSINGS) ×1
BAG HAMPER (MISCELLANEOUS) ×3 IMPLANT
CLOTH BEACON ORANGE TIMEOUT ST (SAFETY) ×3 IMPLANT
COVER LIGHT HANDLE STERIS (MISCELLANEOUS) ×6 IMPLANT
DERMABOND ADVANCED (GAUZE/BANDAGES/DRESSINGS) ×2
DERMABOND ADVANCED .7 DNX12 (GAUZE/BANDAGES/DRESSINGS) ×1 IMPLANT
DRAIN PENROSE 18X1/2 LTX STRL (DRAIN) ×3 IMPLANT
ELECT REM PT RETURN 9FT ADLT (ELECTROSURGICAL) ×3
ELECTRODE REM PT RTRN 9FT ADLT (ELECTROSURGICAL) ×1 IMPLANT
GLOVE BIOGEL PI IND STRL 7.0 (GLOVE) ×1 IMPLANT
GLOVE BIOGEL PI INDICATOR 7.0 (GLOVE) ×2
GLOVE SURG SS PI 7.5 STRL IVOR (GLOVE) ×3 IMPLANT
GOWN STRL REUS W/ TWL XL LVL3 (GOWN DISPOSABLE) ×1 IMPLANT
GOWN STRL REUS W/TWL LRG LVL3 (GOWN DISPOSABLE) ×6 IMPLANT
GOWN STRL REUS W/TWL XL LVL3 (GOWN DISPOSABLE) ×3
INST SET MINOR GENERAL (KITS) ×3 IMPLANT
KIT ROOM TURNOVER APOR (KITS) ×3 IMPLANT
MANIFOLD NEPTUNE II (INSTRUMENTS) ×3 IMPLANT
MESH MARLEX PLUG MEDIUM (Mesh General) ×2 IMPLANT
NDL HYPO 21X1.5 SAFETY (NEEDLE) ×1 IMPLANT
NEEDLE HYPO 21X1.5 SAFETY (NEEDLE) ×3 IMPLANT
NS IRRIG 1000ML POUR BTL (IV SOLUTION) ×3 IMPLANT
PACK MINOR (CUSTOM PROCEDURE TRAY) ×3 IMPLANT
PAD ARMBOARD 7.5X6 YLW CONV (MISCELLANEOUS) ×3 IMPLANT
SCRUB PCMX 4 OZ (MISCELLANEOUS) ×3 IMPLANT
SET BASIN LINEN APH (SET/KITS/TRAYS/PACK) ×3 IMPLANT
SUT NOVA NAB GS-22 2 2-0 T-19 (SUTURE) ×4 IMPLANT
SUT PROLENE 2 0 SH 30 (SUTURE) IMPLANT
SUT SILK 3 0 (SUTURE)
SUT SILK 3-0 18XBRD TIE 12 (SUTURE) IMPLANT
SUT VIC AB 2-0 CT1 27 (SUTURE) ×3
SUT VIC AB 2-0 CT1 TAPERPNT 27 (SUTURE) ×1 IMPLANT
SUT VIC AB 3-0 SH 27 (SUTURE) ×3
SUT VIC AB 3-0 SH 27X BRD (SUTURE) ×1 IMPLANT
SUT VIC AB 4-0 PS2 27 (SUTURE) ×3 IMPLANT
SUT VICRYL AB 3 0 TIES (SUTURE) IMPLANT
SYR 20CC LL (SYRINGE) ×3 IMPLANT

## 2016-06-12 NOTE — Anesthesia Preprocedure Evaluation (Signed)
Anesthesia Evaluation  Patient identified by MRN, date of birth, ID band Patient awake    Reviewed: Allergy & Precautions, H&P , NPO status , Patient's Chart, lab work & pertinent test results  Airway Mallampati: III  TM Distance: >3 FB Neck ROM: full    Dental  (+) Edentulous Upper, Edentulous Lower   Pulmonary COPD, Current Smoker,    breath sounds clear to auscultation       Cardiovascular hypertension, + CAD and + CABG  + dysrhythmias  Rhythm:Regular Rate:Normal     Neuro/Psych  Neuromuscular disease CVA, Residual Symptoms    GI/Hepatic   Endo/Other  diabetes, Type 2  Renal/GU      Musculoskeletal   Abdominal   Peds  Hematology   Anesthesia Other Findings   Reproductive/Obstetrics                             Anesthesia Physical Anesthesia Plan  ASA: III  Anesthesia Plan: General   Post-op Pain Management:    Induction: Intravenous  Airway Management Planned: LMA  Additional Equipment:   Intra-op Plan:   Post-operative Plan: Extubation in OR  Informed Consent: I have reviewed the patients History and Physical, chart, labs and discussed the procedure including the risks, benefits and alternatives for the proposed anesthesia with the patient or authorized representative who has indicated his/her understanding and acceptance.     Plan Discussed with:   Anesthesia Plan Comments:         Anesthesia Quick Evaluation

## 2016-06-12 NOTE — Op Note (Signed)
Patient:  Alanda AmassWoodrow S Ciesla  DOB:  [birth year] . MRN:  [id number]   Preop Diagnosis:  Left inguinal hernia  Postop Diagnosis:  Same  Procedure:  Left inguinal herniorrhaphy with mesh  Surgeon:  Franky MachoMark Secret Kristensen, M.D.  Anes:  Gen.  Indications:  Patient is a 65 year old white male who presents with a symptomatic left inguinal hernia. The risks and benefits of the procedure including bleeding, infection, mesh use, and the possibility of recurrence of the hernia were fully explained to the patient, who gave informed consent.  Procedure note:  The patient was placed the supine position. After general anesthesia was administered, the left groin region was prepped and draped using the usual sterile technique with CHG.  Surgical site confirmation was performed.  A transverse incision was made in the left groin region down to the external oblique aponeuroses. The aponeuroses was incised to the external ring. A Penrose drain was placed around the spermatic cord. The vase deferens was noted within the spermatic cord. The ilioinguinal nerve was identified and retracted superiorly from the operative field. An indirect hernia was found. This was freed away from the spermatic cord and high ligation was performed using a 2-0 Novafil interrupted suture. The remaining hernia sac was then inverted and a medium size Bard PerFix plug was then inserted into this region. An onlay patch was then placed along the floor of the inguinal canal and secured superiorly to the conjoined tendon and inferiorly to the shelving edge of Poupart's ligament using 2-0 Novafil interrupted sutures. The internal ring was re-created using a 2-0 Novafil interrupted suture. The external oblique aponeuroses was reapproximated using 2-0 Vicryl running suture. Subcutaneous layer was reapproximated using 3-0 Vicryl interrupted suture. The skin was closed using a 4-0 Vicryl subcuticular suture.  Exparel was instilled into the surrounding wound.  Dermabond was then applied.  All tape and needle counts were correct at the end the procedure. Patient was awakened and transferred to PACU in stable condition.    Complications:  None  EBL:  Minimal  Specimen:  None

## 2016-06-12 NOTE — Anesthesia Procedure Notes (Signed)
Procedure Name: LMA Insertion Date/Time: 06/12/2016 10:18 AM Performed by: Franco NonesYATES, Derra Shartzer S Pre-anesthesia Checklist: Patient identified, Patient being monitored, Emergency Drugs available, Timeout performed and Suction available Patient Re-evaluated:Patient Re-evaluated prior to inductionOxygen Delivery Method: Circle System Utilized Preoxygenation: Pre-oxygenation with 100% oxygen Intubation Type: IV induction Ventilation: Mask ventilation without difficulty LMA: LMA inserted LMA Size: 4.0 Number of attempts: 1 Placement Confirmation: positive ETCO2 and breath sounds checked- equal and bilateral Tube secured with: Tape Dental Injury: Teeth and Oropharynx as per pre-operative assessment

## 2016-06-12 NOTE — Transfer of Care (Signed)
Immediate Anesthesia Transfer of Care Note  Patient: Mark Rios  Procedure(s) Performed: Procedure(s): HERNIA REPAIR INGUINAL ADULT WITH MESH (Left)  Patient Location: PACU  Anesthesia Type:General  Level of Consciousness: awake and patient cooperative  Airway & Oxygen Therapy: Patient Spontanous Breathing and non-rebreather face mask  Post-op Assessment: Report given to RN and Post -op Vital signs reviewed and stable  Post vital signs: Reviewed and stable  Last Vitals:  Vitals:   06/12/16 0950 06/12/16 0955  BP: 121/75   Pulse:    Resp: (!) 28 14  Temp:      Last Pain:  Vitals:   06/12/16 0847  TempSrc: Oral  PainSc: 9       Patients Stated Pain Goal: 6 (06/12/16 0847)  Complications: No apparent anesthesia complications

## 2016-06-12 NOTE — Interval H&P Note (Signed)
History and Physical Interval Note:  06/12/2016 9:59 AM  Mark Rios  has presented today for surgery, with the diagnosis of left inguinal hernia  The various methods of treatment have been discussed with the patient and family. After consideration of risks, benefits and other options for treatment, the patient has consented to  Procedure(s): HERNIA REPAIR INGUINAL ADULT WITH MESH (Left) as a surgical intervention .  The patient's history has been reviewed, patient examined, no change in status, stable for surgery.  I have reviewed the patient's chart and labs.  Questions were answered to the patient's satisfaction.     Franky MachoJENKINS,Luismiguel Lamere A

## 2016-06-12 NOTE — Discharge Instructions (Signed)
Open Hernia Repair, Care After °Refer to this sheet in the next few weeks. These instructions provide you with information on caring for yourself after your procedure. Your health care provider may also give you more specific instructions. Your treatment has been planned according to current medical practices, but problems sometimes occur. Call your health care provider if you have any problems or questions after your procedure. °WHAT TO EXPECT AFTER THE PROCEDURE °After your procedure, it is typical to have the following: °· Pain in your abdomen, especially along your incision. You will be given pain medicines to control the pain. °· Constipation. You may be given a stool softener to help prevent this. °HOME CARE INSTRUCTIONS °· Only take over-the-counter or prescription medicines as directed by your health care provider. °· Keep the incision area dry and clean. You may wash the incision area gently with soap and water 48 hours after surgery. Gently blot or dab the incision area dry. Do not take baths, use swimming pools, or use hot tubs for 10 days or until your health care provider approves. °· Change bandages (dressings) as directed by your health care provider. °· Continue your normal diet as directed by your health care provider. Eat plenty of fruits and vegetables to help prevent constipation. °· Drink enough fluids to keep your urine clear or pale yellow. This also helps prevent constipation. °· Do not drive until your health care provider says it is okay. °· Do not lift anything heavier than 10 lb (4.5 kg) or play contact sports for 4 weeks or until your health care provider approves. °· Follow up with your health care provider as directed. Ask your health care provider when to make an appointment to have your stitches (sutures) or staples removed. °SEEK MEDICAL CARE IF: °· You have increased bleeding coming from the incision site. °· You have blood in your stool. °· You have increasing pain in the incision  area. °· You see redness or swelling in the incision area. °· You have fluid (pus) coming from the incision. °· You have a fever. °· You notice a bad smell coming from the incision area or dressing. °SEEK IMMEDIATE MEDICAL CARE IF: °· You develop a rash. °· You have chest pain or shortness of breath. °· You feel lightheaded or feel faint. °  °This information is not intended to replace advice given to you by your health care provider. Make sure you discuss any questions you have with your health care provider. °  °Document Released: 02/22/2005 Document Revised: 08/26/2014 Document Reviewed: 03/17/2013 °Elsevier Interactive Patient Education ©2016 Elsevier Inc. ° °

## 2016-06-12 NOTE — Anesthesia Postprocedure Evaluation (Signed)
Anesthesia Post Note  Patient: Mark Rios  Procedure(s) Performed: Procedure(s) (LRB): HERNIA REPAIR INGUINAL ADULT WITH MESH (Left)  Patient location during evaluation: PACU Anesthesia Type: General Level of consciousness: awake Pain management: satisfactory to patient Vital Signs Assessment: post-procedure vital signs reviewed and stable Respiratory status: spontaneous breathing Cardiovascular status: stable Anesthetic complications: no    Last Vitals:  Vitals:   06/12/16 1200 06/12/16 1207  BP:  (!) 145/83  Pulse: 71 77  Resp: 14 16  Temp:  36.4 C    Last Pain:  Vitals:   06/12/16 1207  TempSrc: Oral  PainSc: 2                  Orphia Mctigue

## 2016-06-17 ENCOUNTER — Encounter (HOSPITAL_COMMUNITY): Payer: Self-pay | Admitting: General Surgery

## 2018-06-26 ENCOUNTER — Other Ambulatory Visit: Payer: Self-pay

## 2018-06-26 ENCOUNTER — Emergency Department (HOSPITAL_COMMUNITY): Payer: Medicare HMO

## 2018-06-26 ENCOUNTER — Encounter (HOSPITAL_COMMUNITY): Payer: Self-pay | Admitting: Emergency Medicine

## 2018-06-26 ENCOUNTER — Inpatient Hospital Stay (HOSPITAL_COMMUNITY)
Admission: EM | Admit: 2018-06-26 | Discharge: 2018-06-27 | DRG: 871 | Disposition: A | Discharge status: Home / Self Care | Payer: Medicare HMO | Attending: Internal Medicine | Admitting: Internal Medicine

## 2018-06-26 DIAGNOSIS — E7800 Pure hypercholesterolemia, unspecified: Secondary | ICD-10-CM | POA: Diagnosis present

## 2018-06-26 DIAGNOSIS — J9601 Acute respiratory failure with hypoxia: Secondary | ICD-10-CM | POA: Diagnosis present

## 2018-06-26 DIAGNOSIS — T380X5A Adverse effect of glucocorticoids and synthetic analogues, initial encounter: Secondary | ICD-10-CM

## 2018-06-26 DIAGNOSIS — J449 Chronic obstructive pulmonary disease, unspecified: Secondary | ICD-10-CM

## 2018-06-26 DIAGNOSIS — Z7984 Long term (current) use of oral hypoglycemic drugs: Secondary | ICD-10-CM | POA: Diagnosis not present

## 2018-06-26 DIAGNOSIS — X58XXXA Exposure to other specified factors, initial encounter: Secondary | ICD-10-CM | POA: Diagnosis present

## 2018-06-26 DIAGNOSIS — R6510 Systemic inflammatory response syndrome (SIRS) of non-infectious origin without acute organ dysfunction: Secondary | ICD-10-CM

## 2018-06-26 DIAGNOSIS — D84821 Immunodeficiency due to drugs: Secondary | ICD-10-CM

## 2018-06-26 DIAGNOSIS — I2510 Atherosclerotic heart disease of native coronary artery without angina pectoris: Secondary | ICD-10-CM | POA: Diagnosis present

## 2018-06-26 DIAGNOSIS — Z79899 Other long term (current) drug therapy: Secondary | ICD-10-CM

## 2018-06-26 DIAGNOSIS — A419 Sepsis, unspecified organism: Principal | ICD-10-CM | POA: Diagnosis present

## 2018-06-26 DIAGNOSIS — R0902 Hypoxemia: Secondary | ICD-10-CM | POA: Diagnosis present

## 2018-06-26 DIAGNOSIS — Z87442 Personal history of urinary calculi: Secondary | ICD-10-CM | POA: Diagnosis not present

## 2018-06-26 DIAGNOSIS — E119 Type 2 diabetes mellitus without complications: Secondary | ICD-10-CM | POA: Diagnosis present

## 2018-06-26 DIAGNOSIS — B349 Viral infection, unspecified: Secondary | ICD-10-CM | POA: Diagnosis present

## 2018-06-26 DIAGNOSIS — Z951 Presence of aortocoronary bypass graft: Secondary | ICD-10-CM | POA: Diagnosis not present

## 2018-06-26 DIAGNOSIS — Z8673 Personal history of transient ischemic attack (TIA), and cerebral infarction without residual deficits: Secondary | ICD-10-CM

## 2018-06-26 DIAGNOSIS — I10 Essential (primary) hypertension: Secondary | ICD-10-CM | POA: Diagnosis present

## 2018-06-26 DIAGNOSIS — M19049 Primary osteoarthritis, unspecified hand: Secondary | ICD-10-CM | POA: Diagnosis present

## 2018-06-26 DIAGNOSIS — J441 Chronic obstructive pulmonary disease with (acute) exacerbation: Secondary | ICD-10-CM | POA: Diagnosis present

## 2018-06-26 DIAGNOSIS — R05 Cough: Secondary | ICD-10-CM | POA: Diagnosis present

## 2018-06-26 DIAGNOSIS — Z7952 Long term (current) use of systemic steroids: Secondary | ICD-10-CM | POA: Diagnosis not present

## 2018-06-26 DIAGNOSIS — Z87891 Personal history of nicotine dependence: Secondary | ICD-10-CM

## 2018-06-26 DIAGNOSIS — Z8249 Family history of ischemic heart disease and other diseases of the circulatory system: Secondary | ICD-10-CM | POA: Diagnosis not present

## 2018-06-26 DIAGNOSIS — Z7982 Long term (current) use of aspirin: Secondary | ICD-10-CM

## 2018-06-26 DIAGNOSIS — E785 Hyperlipidemia, unspecified: Secondary | ICD-10-CM | POA: Diagnosis present

## 2018-06-26 DIAGNOSIS — R059 Cough, unspecified: Secondary | ICD-10-CM

## 2018-06-26 DIAGNOSIS — Z9861 Coronary angioplasty status: Secondary | ICD-10-CM

## 2018-06-26 HISTORY — DX: Hyperlipidemia, unspecified: E78.5

## 2018-06-26 HISTORY — DX: Essential (primary) hypertension: I10

## 2018-06-26 HISTORY — DX: Hypoxemia: R09.02

## 2018-06-26 HISTORY — DX: Atherosclerotic heart disease of native coronary artery without angina pectoris: I25.10

## 2018-06-26 HISTORY — DX: Presence of aortocoronary bypass graft: Z95.1

## 2018-06-26 HISTORY — DX: Sepsis, unspecified organism: A41.9

## 2018-06-26 HISTORY — DX: Immunodeficiency due to drugs: D84.821

## 2018-06-26 HISTORY — DX: Adverse effect of glucocorticoids and synthetic analogues, initial encounter: T38.0X5A

## 2018-06-26 LAB — URINALYSIS, ROUTINE W REFLEX MICROSCOPIC
BACTERIA UA: NONE SEEN
BILIRUBIN URINE: NEGATIVE
Glucose, UA: NEGATIVE mg/dL
Ketones, ur: 20 mg/dL — AB
Leukocytes, UA: NEGATIVE
NITRITE: NEGATIVE
Protein, ur: 30 mg/dL — AB
SPECIFIC GRAVITY, URINE: 1.013 (ref 1.005–1.030)
pH: 5 (ref 5.0–8.0)

## 2018-06-26 LAB — COMPREHENSIVE METABOLIC PANEL
ALBUMIN: 4.4 g/dL (ref 3.5–5.0)
ALK PHOS: 57 U/L (ref 38–126)
ALT: 19 U/L (ref 0–44)
ANION GAP: 11 (ref 5–15)
AST: 22 U/L (ref 15–41)
BUN: 19 mg/dL (ref 8–23)
CALCIUM: 8.9 mg/dL (ref 8.9–10.3)
CHLORIDE: 96 mmol/L — AB (ref 98–111)
CO2: 25 mmol/L (ref 22–32)
Creatinine, Ser: 0.87 mg/dL (ref 0.61–1.24)
GFR calc Af Amer: >60 mL/min (ref >60)
GFR calc non Af Amer: >60 mL/min (ref >60)
Glucose, Bld: 128 mg/dL — ABNORMAL HIGH (ref 70–99)
Potassium: 4.2 mmol/L (ref 3.5–5.1)
SODIUM: 132 mmol/L — AB (ref 135–145)
Total Bilirubin: 1.2 mg/dL (ref 0.3–1.2)
Total Protein: 7.7 g/dL (ref 6.5–8.1)

## 2018-06-26 LAB — GLUCOSE, CAPILLARY
Glucose-Capillary: 254 mg/dL — ABNORMAL HIGH (ref 70–99)
Glucose-Capillary: 277 mg/dL — ABNORMAL HIGH (ref 70–99)
Glucose-Capillary: 152 mg/dL — ABNORMAL HIGH (ref 70–99)

## 2018-06-26 LAB — CBC WITH DIFFERENTIAL/PLATELET
ABS IMMATURE GRANULOCYTES: 0.06 10*3/uL (ref 0.00–0.07)
BASOS PCT: 1 %
Basophils Absolute: 0.1 10*3/uL (ref 0.0–0.1)
Eosinophils Absolute: 0.1 10*3/uL (ref 0.0–0.5)
Eosinophils Relative: 1 %
HCT: 51.9 % (ref 39.0–52.0)
Hemoglobin: 17.2 g/dL — ABNORMAL HIGH (ref 13.0–17.0)
Immature Granulocytes: 1 %
Lymphocytes Relative: 13 %
Lymphs Abs: 1.4 10*3/uL (ref 0.7–4.0)
MCH: 31 pg (ref 26.0–34.0)
MCHC: 33.1 g/dL (ref 30.0–36.0)
MCV: 93.5 fL (ref 80.0–100.0)
MONO ABS: 1.2 10*3/uL — AB (ref 0.1–1.0)
MONOS PCT: 11 %
Neutro Abs: 7.8 10*3/uL — ABNORMAL HIGH (ref 1.7–7.7)
Neutrophils Relative %: 73 %
PLATELETS: 194 10*3/uL (ref 150–400)
RBC: 5.55 MIL/uL (ref 4.22–5.81)
RDW: 13.4 % (ref 11.5–15.5)
WBC: 10.6 10*3/uL — AB (ref 4.0–10.5)
nRBC: 0 % (ref 0.0–0.2)

## 2018-06-26 LAB — INFLUENZA PANEL BY PCR (TYPE A & B)
INFLAPCR: NEGATIVE
INFLBPCR: NEGATIVE

## 2018-06-26 LAB — RESPIRATORY PANEL BY PCR

## 2018-06-26 LAB — PROCALCITONIN: Procalcitonin: <0.1 ng/mL

## 2018-06-26 LAB — I-STAT CG4 LACTIC ACID, ED: Lactic Acid, Venous: 1.12 mmol/L (ref 0.5–1.9)

## 2018-06-26 LAB — TROPONIN I: TROPONIN I: 0.03 ng/mL — AB (ref <0.03)

## 2018-06-26 LAB — BRAIN NATRIURETIC PEPTIDE: B Natriuretic Peptide: 249 pg/mL — ABNORMAL HIGH (ref 0.0–100.0)

## 2018-06-26 MED ORDER — ACETAMINOPHEN 325 MG PO TABS: 650.0000 mg | ORAL_TABLET | Freq: Four times a day (QID) | ORAL | Status: DC | PRN

## 2018-06-26 MED ORDER — INSULIN ASPART 100 UNIT/ML ~~LOC~~ SOLN: 0.0000 [IU] | Freq: Every day | SUBCUTANEOUS | Status: DC

## 2018-06-26 MED ORDER — SODIUM CHLORIDE 0.9 % IV SOLN
500.0000 mg | Freq: Once | INTRAVENOUS | Status: AC
  Administered 2018-06-26: 500 mg via INTRAVENOUS
  Filled 2018-06-26: qty 500

## 2018-06-26 MED ORDER — ACETAMINOPHEN 325 MG PO TABS
650.0000 mg | ORAL_TABLET | Freq: Once | ORAL | Status: AC
  Administered 2018-06-26: 650 mg via ORAL
  Filled 2018-06-26: qty 2

## 2018-06-26 MED ORDER — ONDANSETRON HCL 4 MG/2ML IJ SOLN: 4.0000 mg | Freq: Four times a day (QID) | INTRAMUSCULAR | Status: DC | PRN

## 2018-06-26 MED ORDER — SODIUM CHLORIDE 0.9 % IV BOLUS (SEPSIS)
1000.0000 mL | Freq: Once | INTRAVENOUS | Status: AC
  Administered 2018-06-26: 1000 mL via INTRAVENOUS

## 2018-06-26 MED ORDER — POLYETHYLENE GLYCOL 3350 17 G PO PACK: 17.0000 g | PACK | Freq: Every day | ORAL | Status: DC | PRN

## 2018-06-26 MED ORDER — INSULIN ASPART 100 UNIT/ML ~~LOC~~ SOLN
0.0000 [IU] | Freq: Three times a day (TID) | SUBCUTANEOUS | Status: DC
  Administered 2018-06-26: 5 [IU] via SUBCUTANEOUS
  Administered 2018-06-27: 1 [IU] via SUBCUTANEOUS

## 2018-06-26 MED ORDER — ASPIRIN EC 325 MG PO TBEC
325.0000 mg | DELAYED_RELEASE_TABLET | Freq: Every day | ORAL | Status: DC
  Administered 2018-06-26: 325 mg via ORAL
  Filled 2018-06-26: qty 1

## 2018-06-26 MED ORDER — SODIUM CHLORIDE 0.9 % IV SOLN
100.0000 mg | Freq: Two times a day (BID) | INTRAVENOUS | Status: DC
  Administered 2018-06-26 – 2018-06-27 (×2): 100 mg via INTRAVENOUS
  Filled 2018-06-26 (×6): qty 100

## 2018-06-26 MED ORDER — TRAMADOL HCL 50 MG PO TABS: 50.0000 mg | ORAL_TABLET | Freq: Four times a day (QID) | ORAL | Status: DC | PRN

## 2018-06-26 MED ORDER — ACETAMINOPHEN 650 MG RE SUPP: 650.0000 mg | Freq: Four times a day (QID) | RECTAL | Status: DC | PRN

## 2018-06-26 MED ORDER — MAGNESIUM SULFATE 2 GM/50ML IV SOLN
2.0000 g | Freq: Once | INTRAVENOUS | Status: AC
  Administered 2018-06-26: 2 g via INTRAVENOUS
  Filled 2018-06-26: qty 50

## 2018-06-26 MED ORDER — LISINOPRIL 10 MG PO TABS
10.0000 mg | ORAL_TABLET | Freq: Every day | ORAL | Status: DC
  Administered 2018-06-26 – 2018-06-27 (×2): 10 mg via ORAL
  Filled 2018-06-26 (×2): qty 1

## 2018-06-26 MED ORDER — SODIUM CHLORIDE 0.9% FLUSH: 3.0000 mL | INTRAVENOUS | Status: DC | PRN

## 2018-06-26 MED ORDER — ENOXAPARIN SODIUM 40 MG/0.4ML ~~LOC~~ SOLN
40.0000 mg | SUBCUTANEOUS | Status: DC
  Administered 2018-06-26: 40 mg via SUBCUTANEOUS
  Filled 2018-06-26: qty 0.4

## 2018-06-26 MED ORDER — SODIUM CHLORIDE 0.9% FLUSH
3.0000 mL | Freq: Two times a day (BID) | INTRAVENOUS | Status: DC
  Administered 2018-06-26 – 2018-06-27 (×2): 3 mL via INTRAVENOUS

## 2018-06-26 MED ORDER — SODIUM CHLORIDE 0.9 % IV BOLUS (SEPSIS)
250.0000 mL | Freq: Once | INTRAVENOUS | Status: AC
  Administered 2018-06-26: 250 mL via INTRAVENOUS

## 2018-06-26 MED ORDER — CARVEDILOL 12.5 MG PO TABS
12.5000 mg | ORAL_TABLET | Freq: Two times a day (BID) | ORAL | Status: DC
  Administered 2018-06-26 – 2018-06-27 (×2): 12.5 mg via ORAL
  Filled 2018-06-26 (×2): qty 1

## 2018-06-26 MED ORDER — METHYLPREDNISOLONE SODIUM SUCC 125 MG IJ SOLR
125.0000 mg | Freq: Once | INTRAMUSCULAR | Status: AC
  Administered 2018-06-26: 125 mg via INTRAVENOUS
  Filled 2018-06-26: qty 2

## 2018-06-26 MED ORDER — ONDANSETRON HCL 4 MG PO TABS: 4.0000 mg | ORAL_TABLET | Freq: Four times a day (QID) | ORAL | Status: DC | PRN

## 2018-06-26 MED ORDER — SODIUM CHLORIDE 0.9 % IV SOLN: 250.0000 mL | INTRAVENOUS | Status: DC | PRN

## 2018-06-26 MED ORDER — SIMVASTATIN 20 MG PO TABS
40.0000 mg | ORAL_TABLET | Freq: Every evening | ORAL | Status: DC
  Administered 2018-06-26: 40 mg via ORAL
  Filled 2018-06-26: qty 2

## 2018-06-26 MED ORDER — METHYLPREDNISOLONE SODIUM SUCC 40 MG IJ SOLR
40.0000 mg | Freq: Four times a day (QID) | INTRAMUSCULAR | Status: DC
  Administered 2018-06-26 – 2018-06-27 (×4): 40 mg via INTRAVENOUS
  Filled 2018-06-26 (×4): qty 1

## 2018-06-26 MED ORDER — SODIUM CHLORIDE 0.9 % IV SOLN
1.0000 g | Freq: Once | INTRAVENOUS | Status: AC
  Administered 2018-06-26: 1 g via INTRAVENOUS
  Filled 2018-06-26: qty 10

## 2018-06-26 MED ORDER — IPRATROPIUM-ALBUTEROL 0.5-2.5 (3) MG/3ML IN SOLN
3.0000 mL | Freq: Four times a day (QID) | RESPIRATORY_TRACT | Status: DC
  Administered 2018-06-26 – 2018-06-27 (×4): 3 mL via RESPIRATORY_TRACT
  Filled 2018-06-26 (×5): qty 3

## 2018-06-26 MED ORDER — IPRATROPIUM-ALBUTEROL 0.5-2.5 (3) MG/3ML IN SOLN
3.0000 mL | Freq: Once | RESPIRATORY_TRACT | Status: AC
  Administered 2018-06-26: 3 mL via RESPIRATORY_TRACT
  Filled 2018-06-26: qty 3

## 2018-06-26 NOTE — ED Notes (Signed)
ED TO INPATIENT HANDOFF REPORT  Name/Age/Gender Mark Rios 67 y.o. male  Code Status Code Status History    Date Active Date Inactive Code Status Order ID Comments User Context   07/01/2014 1618 07/07/2014 1412 Full Code [id number]  Odis Luster Inpatient   06/29/2014 2244 07/01/2014 1615 Full Code [id number]  Ivin Poot, MD ED      Home/SNF/Other Home  Chief Complaint sob  Level of Care/Admitting Diagnosis ED Disposition    ED Disposition Condition [place]: [facility] [[id number]]  Level of Care: Med-Surg [16]  Diagnosis: Hypoxia [016553]  Admitting Physician: Cristy Folks [[id number]]  Attending Physician: Cristy Folks [[id number]]  Estimated length of stay: past midnight tomorrow  Certification:: I certify this patient will need inpatient services for at least 2 midnights  PT Class (Do Not Modify): Inpatient [101]  PT Acc Code (Do Not Modify): Private [1]       Medical History Past Medical History:  Diagnosis Date  . Abdominal hernia   . Arrhythmia   . Arthritis   . CAD (coronary artery disease) 06/26/2018  . COPD (chronic obstructive pulmonary disease) ([place])   . Diabetes mellitus without complication ([place])   . High cholesterol   . History of kidney stones    history of kidney stones  . HLD (hyperlipidemia) 06/26/2018  . HTN (hypertension) 06/26/2018  . Hypertension   . Pneumothorax   . 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 Stroke [facility]) 2010   right leg slightly weaker   . Traumatic diaphragmatic hernia    Repaired 07/01/2014    Allergies No Known Allergies  IV Location/Drains/Wounds Patient Lines/Drains/Airways Status   Active Line/Drains/Airways    Name:   Placement date:   Placement time:   Site:   Days:   Peripheral IV 06/26/18 Left Antecubital   06/26/18    0913    Antecubital   less than 1   Incision (Closed) 07/01/14 Chest Left   07/01/14    1445     1456   Incision (Closed)  08/31/14 Abdomen Other (Comment)   08/31/14    0928     1395   Incision (Closed) 08/31/14 Groin Other (Comment)   08/31/14    0928     1395   Incision (Closed) 06/12/16 Groin Left   06/12/16    1039     744   Pressure Ulcer 06/30/14 Stage I -  Intact skin with non-blanchable redness of a localized area usually over a bony prominence. redness   06/30/14    0030     1457          Labs/Imaging Results for orders placed or performed during the hospital encounter of 06/26/18 (from the past 48 hour(s))  CBC with Differential/Platelet     Status: Abnormal   Collection Time: 06/26/18  9:16 AM  Result Value Ref Range   WBC 10.6 (H) 4.0 - 10.5 K/uL   RBC 5.55 4.22 - 5.81 MIL/uL   Hemoglobin 17.2 (H) 13.0 - 17.0 g/dL   HCT 51.9 39.0 - 52.0 %   MCV 93.5 80.0 - 100.0 fL   MCH 31.0 26.0 - 34.0 pg   MCHC 33.1 30.0 - 36.0 g/dL   RDW 13.4 11.5 - 15.5 %   Platelets 194 150 - 400 K/uL   nRBC 0.0 0.0 - 0.2 %   Neutrophils Relative % 73 %   Neutro Abs 7.8 (H) 1.7 -  7.7 K/uL   Lymphocytes Relative 13 %   Lymphs Abs 1.4 0.7 - 4.0 K/uL   Monocytes Relative 11 %   Monocytes Absolute 1.2 (H) 0.1 - 1.0 K/uL   Eosinophils Relative 1 %   Eosinophils Absolute 0.1 0.0 - 0.5 K/uL   Basophils Relative 1 %   Basophils Absolute 0.1 0.0 - 0.1 K/uL   Immature Granulocytes 1 %   Abs Immature Granulocytes 0.06 0.00 - 0.07 K/uL    Comment: Performed at [facility], [address], [place], [place] [address]  Troponin I     Status: Abnormal   Collection Time: 06/26/18  9:16 AM  Result Value Ref Range   Troponin I 0.03 (HH) <0.03 ng/mL    Comment: CRITICAL RESULT CALLED TO, READ BACK BY AND VERIFIED WITH: MICHAEL @ 1030 ON [id number] BY HENDERSON L. Performed at [facility], [address] [place], [place] [address]   I-Stat CG4 Lactic Acid, ED     Status: None   Collection Time: 06/26/18  9:17 AM  Result Value Ref Range   Lactic Acid, Venous 1.12 0.5 - 1.9 mmol/L  Brain natriuretic peptide     Status:  Abnormal   Collection Time: 06/26/18  9:22 AM  Result Value Ref Range   B Natriuretic Peptide 249.0 (H) 0.0 - 100.0 pg/mL    Comment: Performed at [facility], [address], [place], [place] [address]  Comprehensive metabolic panel     Status: Abnormal   Collection Time: 06/26/18  9:22 AM  Result Value Ref Range   Sodium 132 (L) 135 - 145 mmol/L   Potassium 4.2 3.5 - 5.1 mmol/L   Chloride 96 (L) 98 - 111 mmol/L   CO2 25 22 - 32 mmol/L   Glucose, Bld 128 (H) 70 - 99 mg/dL   BUN 19 8 - 23 mg/dL   Creatinine, Ser 0.87 0.61 - 1.24 mg/dL   Calcium 8.9 8.9 - 10.3 mg/dL   Total Protein 7.7 6.5 - 8.1 g/dL   Albumin 4.4 3.5 - 5.0 g/dL   AST 22 15 - 41 U/L   ALT 19 0 - 44 U/L   Alkaline Phosphatase 57 38 - 126 U/L   Total Bilirubin 1.2 0.3 - 1.2 mg/dL   GFR calc non Af Amer >60 >60 mL/min   GFR calc Af Amer >60 >60 mL/min    Comment: (NOTE) The eGFR has been calculated using the CKD EPI equation. This calculation has not been validated in all clinical situations. eGFR's persistently <60 mL/min signify possible Chronic Kidney Disease.    Anion gap 11 5 - 15    Comment: Performed at [facility], [address] [place], [place] [address]  Blood Culture (routine x 2)     Status: None (Preliminary result)   Collection Time: 06/26/18  9:26 AM  Result Value Ref Range   Specimen Description BLOOD RIGHT ARM    Special Requests      BOTTLES DRAWN AEROBIC AND ANAEROBIC Blood Culture adequate volume Performed at [facility], [address] [place], [place] [address]    Culture PENDING    Report Status PENDING   Blood Culture (routine x 2)     Status: None (Preliminary result)   Collection Time: 06/26/18  9:26 AM  Result Value Ref Range   Specimen Description BLOOD RIGHT HAND    Special Requests      BOTTLES DRAWN AEROBIC AND ANAEROBIC Blood Culture adequate volume Performed at [facility], [address],  [place], [place] [address]    Culture PENDING    Report Status PENDING    Urinalysis, Routine w reflex microscopic     Status: Abnormal   Collection Time: 06/26/18 11:05 AM  Result Value Ref Range   Color, Urine YELLOW YELLOW   APPearance CLEAR CLEAR   Specific Gravity, Urine 1.013 1.005 - 1.030   pH 5.0 5.0 - 8.0   Glucose, UA NEGATIVE NEGATIVE mg/dL   Hgb urine dipstick LARGE (A) NEGATIVE   Bilirubin Urine NEGATIVE NEGATIVE   Ketones, ur 20 (A) NEGATIVE mg/dL   Protein, ur 30 (A) NEGATIVE mg/dL   Nitrite NEGATIVE NEGATIVE   Leukocytes, UA NEGATIVE NEGATIVE   RBC / HPF 0-5 0 - 5 RBC/hpf   WBC, UA 0-5 0 - 5 WBC/hpf   Bacteria, UA NONE SEEN NONE SEEN   Mucus PRESENT     Comment: Performed at [facility], [address], [place], [place] [address]  Influenza panel by PCR (type A & B)     Status: None   Collection Time: 06/26/18 11:10 AM  Result Value Ref Range   Influenza A By PCR NEGATIVE NEGATIVE   Influenza B By PCR NEGATIVE NEGATIVE    Comment: (NOTE) The Xpert Xpress Flu assay is intended as an aid in the diagnosis of  influenza and should not be used as a sole basis for treatment.  This  assay is FDA approved for nasopharyngeal swab specimens only. Nasal  washings and aspirates are unacceptable for Xpert Xpress Flu testing. Performed at [facility], [address], [place],  [address]    Dg Chest Port 1 View  Result Date: 06/26/2018 CLINICAL DATA:  Shortness of breath and cough for few days EXAM: PORTABLE CHEST 1 VIEW COMPARISON:  July 26, 2014 FINDINGS: The heart size and mediastinal contours are stable. Patient status post prior median sternotomy and CABG. Both lungs are clear. The visualized skeletal structures are stable. IMPRESSION: No active cardiopulmonary disease. Electronically Signed   By: Abelardo Diesel M.D.   On: 06/26/2018 09:39    Pending Labs [employer] (From admission, onward)    Start     Ordered   Signed and Held  HIV antibody (Routine Testing)  Once,   R     Signed and Held   Signed and Held  Respiratory  Panel by PCR  (Respiratory virus panel)  Once,   R     Signed and Held   Signed and Held  Procalcitonin - Baseline  STAT - ONCE,   STAT     Signed and Held   Signed and Held  Basic metabolic panel  Tomorrow morning,   R     Signed and Held   Signed and Held  CBC  Tomorrow morning,   R     Signed and Held          Vitals/Pain Today's Vitals   06/26/18 1115 06/26/18 1130 06/26/18 1200 06/26/18 1230  BP:  137/74 129/73 111/70  Pulse:      Resp:      Temp:      TempSrc:      SpO2: 94% 95% 95% 91%  Weight:      Height:      PainSc:        Isolation Precautions Droplet precaution  Medications Medications  azithromycin (ZITHROMAX) 500 mg in sodium chloride 0.9 % 250 mL IVPB (500 mg Intravenous New Bag/Given 06/26/18 1202)  ipratropium-albuterol (DUONEB) 0.5-2.5 (3) MG/3ML nebulizer solution 3 mL (  3 mLs Nebulization Given 06/26/18 0953)  magnesium sulfate IVPB 2 g 50 mL (0 g Intravenous Stopped 06/26/18 1049)  methylPREDNISolone sodium succinate (SOLU-MEDROL) 125 mg/2 mL injection 125 mg (125 mg Intravenous Given 06/26/18 0907)  sodium chloride 0.9 % bolus 1,000 mL (0 mLs Intravenous Stopped 06/26/18 1049)    And  sodium chloride 0.9 % bolus 1,000 mL (0 mLs Intravenous Stopped 06/26/18 1159)    And  sodium chloride 0.9 % bolus 250 mL (0 mLs Intravenous Stopped 06/26/18 1049)  acetaminophen (TYLENOL) tablet 650 mg (650 mg Oral Given 06/26/18 0924)  cefTRIAXone (ROCEPHIN) 1 g in sodium chloride 0.9 % 100 mL IVPB (0 g Intravenous Stopped 06/26/18 1159)    Mobility walks

## 2018-06-26 NOTE — H&P (Signed)
History and Physical    Mark Rios:[id number] DOB: 1951/08/08 DOA: 06/26/2018  PCP: Gareth Morgan, MD   Patient coming from: home    Chief Complaint: SOB  HPI: Mark Rios is a 67 y.o. male with medical history significant of coronary artery disease status post CABG, nephrolithiasis, type 2 diabetes on oral hyperglycemics, COPD, hypertension, hyperlipidemia, arthritis on chronic prednisone who presents with shortness of breath and dyspnea on exertion.  Patient reports that for the past 2 to 3 days he has had increasing shortness of breath.  Initially the shortness of breath was only on exertion but then progressively got more and more during rest as well.  He also noted a nonproductive dry cough.  The cough is not worse at night.  He also began to notice intermittent wheezing along with the shortness of breath.  He has not had any fevers but does report chills.  He did not have any nausea, vomiting, diarrhea, abdominal pain, chest pain, syncope/pre-syncope, rash.  He currently does not smoke and has quit for several years.  ED Course: In the ER patient was noted to be febrile, tachycardic, tachypneic, hypoxic.  He did meet sepsis criteria.  He was placed on oxygen.  Chest x-ray was clear.  Labs are notable for mild hemoconcentration and a white count of 10.6.  It was normal.  Patient was given sepsis bolus and started on IV ceftriaxone and azithromycin.  He was given 125 mg of methylprednisolone and a bronchodilator.  Review of Systems: As per HPI otherwise 10 point review of systems negative.   Past Medical History:  Diagnosis Date  . Abdominal hernia   . Arrhythmia   . Arthritis   . CAD (coronary artery disease) 06/26/2018  . COPD (chronic obstructive pulmonary disease) (HCC)   . Diabetes mellitus without complication (HCC)   . High cholesterol   . History of kidney stones    history of kidney stones  . HLD (hyperlipidemia) 06/26/2018  . HTN (hypertension) 06/26/2018  .  Hypertension   . Pneumothorax   . 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 Stroke [facility]) 2010   right leg slightly weaker   . Traumatic diaphragmatic hernia    Repaired 07/01/2014    Past Surgical History:  Procedure Laterality Date  . CAROTID ARTERY ANGIOPLASTY Left    2007  . CORONARY ARTERY BYPASS GRAFT     x3  . HEMORROIDECTOMY    . INGUINAL HERNIA REPAIR Right 08/31/2014   Procedure: HERNIA REPAIR INGUINAL ADULT;  Surgeon: Dalia Heading, MD;  Location: [facility];  Service: General;  Laterality: Right;  . INGUINAL HERNIA REPAIR Left 06/12/2016   Procedure: HERNIA REPAIR INGUINAL ADULT WITH MESH;  Surgeon: Franky Macho, MD;  Location: [facility];  Service: General;  Laterality: Left;  . INSERTION OF MESH Right 08/31/2014   Procedure: INSERTION OF MESH;  Surgeon: Dalia Heading, MD;  Location: [facility];  Service: General;  Laterality: Right;  inguinal hernia  . UMBILICAL HERNIA REPAIR N/A 08/31/2014   Procedure: HERNIA REPAIR UMBILICAL ADULT ;  Surgeon: Dalia Heading, MD;  Location: [facility];  Service: General;  Laterality: N/A;  . VIDEO ASSISTED THORACOSCOPY (VATS)/THOROCOTOMY Left 07/01/2014   Procedure: with repair left diaphragmatic hernia ;  Surgeon: Loreli Slot, MD;  Location: [facility] OR;  Service: Thoracic;  Laterality: Left;  Marland Kitchen VIDEO BRONCHOSCOPY N/A 07/01/2014   Procedure: VIDEO BRONCHOSCOPY;  Surgeon: Loreli Slot, MD;  Location: [facility] OR;  Service: Thoracic;  Laterality: N/A;     reports that he quit smoking 3 days ago. His smoking use included cigarettes. He has a 24.00 pack-year smoking history. He has never used smokeless tobacco. He reports that he does not drink alcohol or use drugs.  No Known Allergies  Family History  Problem Relation Age of Onset  . Coronary artery disease Father     Prior to Admission medications   Medication Sig Start Date End Date Taking? Authorizing Provider  albuterol (PROVENTIL HFA;VENTOLIN HFA) 108 (90 BASE) MCG/ACT inhaler  Inhale 2 puffs into the lungs every 6 (six) hours as needed for wheezing or shortness of breath.   Yes [provider]  albuterol (PROVENTIL) (2.5 MG/3ML) 0.083% nebulizer solution Take 2.5 mg by nebulization every 4 (four) hours as needed for wheezing.   Yes [provider]  aspirin 325 MG EC tablet Take 325 mg by mouth at bedtime.    Yes [provider]  carvedilol (COREG) 12.5 MG tablet Take 12.5 mg by mouth 2 (two) times daily with a meal.    Yes [provider]  ipratropium (ATROVENT) 0.02 % nebulizer solution Take 500 mcg by nebulization every 4 (four) hours as needed for wheezing.   Yes [provider]  lisinopril (PRINIVIL,ZESTRIL) 10 MG tablet Take 10 mg by mouth daily.   Yes [provider]  metFORMIN (GLUCOPHAGE) 500 MG tablet Take 500 mg by mouth 2 (two) times daily with a meal. Patient takes 1000 mg in the morning and 500 mg at supper   Yes [provider]  predniSONE (DELTASONE) 10 MG tablet Take 10 mg by mouth every other day. As needed arthritis   Yes [provider]  simvastatin (ZOCOR) 40 MG tablet Take 40 mg by mouth every evening.   Yes [provider]  oxyCODONE-acetaminophen (PERCOCET) 7.5-325 MG tablet Take 1-2 tablets by mouth every 4 (four) hours as needed. Patient not taking: Reported on 06/26/2018 06/12/16   Franky Macho, MD    Physical Exam: Vitals:   06/26/18 1100 06/26/18 1115 06/26/18 1130 06/26/18 1200  BP: (!) 141/97  137/74 129/73  Pulse:      Resp:      Temp:      TempSrc:      SpO2: (!) 89% 94% 95% 95%  Weight:      Height:        Constitutional: NAD, calm, comfortable Vitals:   06/26/18 1100 06/26/18 1115 06/26/18 1130 06/26/18 1200  BP: (!) 141/97  137/74 129/73  Pulse:      Resp:      Temp:      TempSrc:      SpO2: (!) 89% 94% 95% 95%  Weight:      Height:       Eyes: Anicteric sclera ENMT: Dry mucous membranes, good dentition Neck: normal,  supple Respiratory: Mildly increased work of breathing, diffuse inspiratory and expiratory wheezes over bilateral upper lobes, scattered rhonchi Cardiovascular: Tachycardic, regular rhythm, no murmurs.  Abdomen: no tenderness, no masses palpated. No hepatosplenomegaly. Bowel sounds positive.  Musculoskeletal: No lower extremity edema.  Skin: no rashes over visible skin Neurologic: Alert and oriented, grossly intact, moving all extremities Psychiatric: Normal judgment and insight. Alert and oriented x 3. Normal mood.     Labs on Admission: I have personally reviewed following labs and imaging studies  CBC: Recent Labs  Lab 06/26/18 0916  WBC 10.6*  NEUTROABS 7.8*  HGB 17.2*  HCT 51.9  MCV 93.5  PLT 194  Basic Metabolic Panel: Recent Labs  Lab 06/26/18 0922  NA 132*  K 4.2  CL 96*  CO2 25  GLUCOSE 128*  BUN 19  CREATININE 0.87  CALCIUM 8.9   GFR: Estimated Creatinine Clearance: 78.1 mL/min (by C-G formula based on SCr of 0.87 mg/dL). Liver Function Tests: Recent Labs  Lab 06/26/18 0922  AST 22  ALT 19  ALKPHOS 57  BILITOT 1.2  PROT 7.7  ALBUMIN 4.4   No results for input(s): LIPASE, AMYLASE in the last 168 hours. No results for input(s): AMMONIA in the last 168 hours. Coagulation Profile: No results for input(s): INR, PROTIME in the last 168 hours. Cardiac Enzymes: Recent Labs  Lab 06/26/18 0916  TROPONINI 0.03*   BNP (last 3 results) No results for input(s): PROBNP in the last 8760 hours. HbA1C: No results for input(s): HGBA1C in the last 72 hours. CBG: No results for input(s): GLUCAP in the last 168 hours. Lipid Profile: No results for input(s): CHOL, HDL, LDLCALC, TRIG, CHOLHDL, LDLDIRECT in the last 72 hours. Thyroid Function Tests: No results for input(s): TSH, T4TOTAL, FREET4, T3FREE, THYROIDAB in the last 72 hours. Anemia Panel: No results for input(s): VITAMINB12, FOLATE, FERRITIN, TIBC, IRON, RETICCTPCT in the last 72 hours. Urine  analysis:    Component Value Date/Time   COLORURINE YELLOW 06/26/2018 1105   APPEARANCEUR CLEAR 06/26/2018 1105   LABSPEC 1.013 06/26/2018 1105   PHURINE 5.0 06/26/2018 1105   GLUCOSEU NEGATIVE 06/26/2018 1105   HGBUR LARGE (A) 06/26/2018 1105   BILIRUBINUR NEGATIVE 06/26/2018 1105   KETONESUR 20 (A) 06/26/2018 1105   PROTEINUR 30 (A) 06/26/2018 1105   UROBILINOGEN 0.2 11/18/2014 1240   NITRITE NEGATIVE 06/26/2018 1105   LEUKOCYTESUR NEGATIVE 06/26/2018 1105    Radiological Exams on Admission: Dg Chest Port 1 View  Result Date: 06/26/2018 CLINICAL DATA:  Shortness of breath and cough for few days EXAM: PORTABLE CHEST 1 VIEW COMPARISON:  July 26, 2014 FINDINGS: The heart size and mediastinal contours are stable. Patient status post prior median sternotomy and CABG. Both lungs are clear. The visualized skeletal structures are stable. IMPRESSION: No active cardiopulmonary disease. Electronically Signed   By: Sherian Rein M.D.   On: 06/26/2018 09:39    EKG: Independently reviewed.  Tachycardic, right bundle branch block with intraventricular conduction delay, unchanged from prior  Assessment/Plan Principal Problem:   Sepsis (HCC) Active Problems:   Hypoxia   HLD (hyperlipidemia)   HTN (hypertension)   Immunosuppression due to chronic steroid use   COPD (chronic obstructive pulmonary disease) (HCC)   CAD (coronary artery disease)   S/P CABG (coronary artery bypass graft)   #) Sepsis from unclear source: At this time with patient's wheezing and shortness of breath as well as cough pneumonia was on the differential however his chest x-ray is clear.  His exam is notable only for reactive airway exacerbation.  His flu is negative.  Certainly this could be another virus causing his COPD exacerbation and fevers.  Unfortunately the patient is on chronic steroids and so risk for immunosuppression. -Antibiotics per below -We will order procalcitonin - We will order respiratory virus  panel -Flu panel negative - Follow blood cultures ordered 06/26/2018  #) Acute hypoxic respiratory failure/COPD exacerbation: -Continue IV methylprednisolone -Every 4 hours bronchodilators and PRN -Continue IV doxycycline started 06/26/2018 -Repeat chest x-ray tomorrow  #) Arthritis on chronic steroids: Patient ports being on chronic prednisone for some time.  It is unclear why he is on this dose as he does  not carry a diagnosis of rheumatoid arthritis. -Hold prednisone 10 mg every other day -Low suspicion to start stress dose steroids if patient becomes hypotensive  #) Coronary artery disease status post CABG: -Continue lisinopril 10 mg daily -Continue carvedilol 12.5 mg twice daily -Continue aspirin 325 mg daily  #)Hypertension/hyperlipidemia: -Continue simvastatin 40 mg nightly -Continue beta-blocker -Continue ACE inhibitor  #) Type 2 diabetes on insulin: -Hold metformin thousand milligrams every morning and 500 mg nightly -Sliding scale insulin, SHS  Fluids: Tolerating p.o. Elect lites: Monitor and supplement Nutrition: Heart healthy/carb restricted diet  Prophylaxis: Enoxaparin  Disposition: Pending resolution of hypoxia and etiology of underlying fevers  Full code  Delaine Lame MD [facility]  If 7PM-7AM, please contact night-coverage [email or web address] Password [facility]  06/26/2018, 12:26 PM

## 2018-06-26 NOTE — ED Provider Notes (Signed)
[facility] EMERGENCY DEPARTMENT Provider Note   CSN: [id number] Arrival date & time: 06/26/18  0830     History   Chief Complaint Chief Complaint  Patient presents with  . Shortness of Breath    HPI Mark Rios is a 67 y.o. male.  He is complaining of upper restaurant infection that started a few days ago.  He had runny nose and sneezing and a cough productive of some orange sputum.  He became more short of breath since yesterday.  He has a history of COPD and is a former smoker.  He does not use oxygen at home.  He is on chronic steroids for arthritis.  He has had some chills at home.  He denies any vomiting diarrhea or urinary symptoms.  No chest pain.  No hemoptysis.  The history is provided by the patient.  Shortness of Breath  This is a new problem. The average episode lasts 3 days. The problem occurs rarely.The problem has been gradually worsening. Associated symptoms include rhinorrhea, cough, sputum production and wheezing. Pertinent negatives include no fever, no headaches, no sore throat, no neck pain, no hemoptysis, no chest pain, no syncope, no vomiting, no abdominal pain, no rash, no leg pain and no leg swelling. It is unknown what precipitated the problem. He has tried beta-agonist inhalers for the symptoms. The treatment provided mild relief. Associated medical issues include COPD.    Past Medical History:  Diagnosis Date  . Abdominal hernia   . Arrhythmia   . Arthritis   . COPD (chronic obstructive pulmonary disease) (HCC)   . Diabetes mellitus without complication (HCC)   . High cholesterol   . History of kidney stones    history of kidney stones  . Hypertension   . Pneumothorax   . Stroke [facility]) 2010   right leg slightly weaker   . Traumatic diaphragmatic hernia    Repaired 07/01/2014    Patient Active Problem List   Diagnosis Date Noted  . Traumatic diaphragmatic hernia   . Diaphragmatic hernia without obstruction 06/29/2014    Past Surgical  History:  Procedure Laterality Date  . CAROTID ARTERY ANGIOPLASTY Left    2007  . CORONARY ARTERY BYPASS GRAFT     x3  . HEMORROIDECTOMY    . INGUINAL HERNIA REPAIR Right 08/31/2014   Procedure: HERNIA REPAIR INGUINAL ADULT;  Surgeon: Dalia Heading, MD;  Location: [facility] ORS;  Service: General;  Laterality: Right;  . INGUINAL HERNIA REPAIR Left 06/12/2016   Procedure: HERNIA REPAIR INGUINAL ADULT WITH MESH;  Surgeon: Franky Macho, MD;  Location: [facility];  Service: General;  Laterality: Left;  . INSERTION OF MESH Right 08/31/2014   Procedure: INSERTION OF MESH;  Surgeon: Dalia Heading, MD;  Location: [facility];  Service: General;  Laterality: Right;  inguinal hernia  . UMBILICAL HERNIA REPAIR N/A 08/31/2014   Procedure: HERNIA REPAIR UMBILICAL ADULT ;  Surgeon: Dalia Heading, MD;  Location: [facility];  Service: General;  Laterality: N/A;  . VIDEO ASSISTED THORACOSCOPY (VATS)/THOROCOTOMY Left 07/01/2014   Procedure: with repair left diaphragmatic hernia ;  Surgeon: Loreli Slot, MD;  Location[facility] OR;  Service: Thoracic;  Laterality: Left;  Marland Kitchen VIDEO BRONCHOSCOPY N/A 07/01/2014   Procedure: VIDEO BRONCHOSCOPY;  Surgeon: Loreli Slot, MD;  Location: [facility] OR;  Service: Thoracic;  Laterality: N/A;        Home Medications    Prior to Admission medications   Medication Sig Start Date End Date Taking? Authorizing Provider  albuterol (PROVENTIL HFA;VENTOLIN HFA) 108 (90 BASE) MCG/ACT inhaler Inhale 2 puffs into the lungs every 6 (six) hours as needed for wheezing or shortness of breath.    [provider]  albuterol (PROVENTIL) (2.5 MG/3ML) 0.083% nebulizer solution Take 2.5 mg by nebulization every 4 (four) hours as needed for wheezing.    [provider]  aspirin 325 MG EC tablet Take 325 mg by mouth at bedtime.     [provider]  carvedilol (COREG) 12.5 MG tablet Take 12.5 mg by mouth 2 (two) times daily with a meal.     [provider]  ipratropium  (ATROVENT) 0.02 % nebulizer solution Take 500 mcg by nebulization every 4 (four) hours as needed for wheezing.    [provider]  lisinopril (PRINIVIL,ZESTRIL) 10 MG tablet Take 10 mg by mouth daily.    [provider]  metFORMIN (GLUCOPHAGE) 500 MG tablet Take 500 mg by mouth 2 (two) times daily with a meal. Patient takes 1000 mg in the morning and 500 mg at supper    [provider]  oxyCODONE-acetaminophen (PERCOCET) 7.5-325 MG tablet Take 1-2 tablets by mouth every 4 (four) hours as needed. 06/12/16   Franky Macho, MD  predniSONE (DELTASONE) 10 MG tablet Take 10 mg by mouth every other day. As needed arthritis    [provider]  simvastatin (ZOCOR) 40 MG tablet Take 40 mg by mouth every evening.    [provider]    Family History No family history on file.  Social History Social History   Tobacco Use  . Smoking status: Current Every Day Smoker    Packs/day: 0.50    Years: 48.00    Pack years: 24.00    Types: Cigarettes  . Smokeless tobacco: Never Used  Substance Use Topics  . Alcohol use: No  . Drug use: No     Allergies   Patient has no known allergies.   Review of Systems Review of Systems  Constitutional: Negative for fever.  HENT: Positive for rhinorrhea. Negative for sore throat.   Eyes: Negative for visual disturbance.  Respiratory: Positive for cough, sputum production, shortness of breath and wheezing. Negative for hemoptysis.   Cardiovascular: Negative for chest pain, leg swelling and syncope.  Gastrointestinal: Negative for abdominal pain and vomiting.  Genitourinary: Negative for dysuria.  Musculoskeletal: Negative for neck pain.  Skin: Negative for rash.  Neurological: Negative for headaches.     Physical Exam Updated Vital Signs Pulse (!) 134   Temp (!) 101.1 F (38.4 C) (Oral)   Resp (!) 36   Ht 5\' 7"  (1.702 m)   Wt 70.3 kg   SpO2 (!) 86%   BMI 24.28 kg/m   Physical Exam  Constitutional: He  is oriented to person, place, and time. He appears well-developed and well-nourished.  HENT:  Head: Normocephalic and atraumatic.  Eyes: Conjunctivae are normal.  Neck: Neck supple.  Cardiovascular: Regular rhythm. Tachycardia present.  No murmur heard. Pulmonary/Chest: Accessory muscle usage present. Tachypnea noted. No respiratory distress. He has wheezes (few scattered).  Abdominal: Soft. There is no tenderness.  Musculoskeletal: He exhibits no edema, tenderness or deformity.  Neurological: He is alert and oriented to person, place, and time.  Skin: Skin is warm and dry. Capillary refill takes less than 2 seconds.  Psychiatric: He has a normal mood and affect.  Nursing note and vitals reviewed.    ED Treatments / Results  Labs (all labs ordered are listed, but only abnormal results are  displayed) Labs Reviewed  CBC WITH DIFFERENTIAL/PLATELET - Abnormal; Notable for the following components:      Result Value   WBC 10.6 (*)    Hemoglobin 17.2 (*)    Neutro Abs 7.8 (*)    Monocytes Absolute 1.2 (*)    All other components within normal limits  TROPONIN I - Abnormal; Notable for the following components:   Troponin I 0.03 (*)    All other components within normal limits  BRAIN NATRIURETIC PEPTIDE - Abnormal; Notable for the following components:   B Natriuretic Peptide 249.0 (*)    All other components within normal limits  COMPREHENSIVE METABOLIC PANEL - Abnormal; Notable for the following components:   Sodium 132 (*)    Chloride 96 (*)    Glucose, Bld 128 (*)    All other components within normal limits  URINALYSIS, ROUTINE W REFLEX MICROSCOPIC - Abnormal; Notable for the following components:   Hgb urine dipstick LARGE (*)    Ketones, ur 20 (*)    Protein, ur 30 (*)    All other components within normal limits  GLUCOSE, CAPILLARY - Abnormal; Notable for the following components:   Glucose-Capillary 254 (*)    All other components within normal limits  GLUCOSE,  CAPILLARY - Abnormal; Notable for the following components:   Glucose-Capillary 277 (*)    All other components within normal limits  CULTURE, BLOOD (ROUTINE X 2)  CULTURE, BLOOD (ROUTINE X 2)  RESPIRATORY PANEL BY PCR  INFLUENZA PANEL BY PCR (TYPE A & B)  PROCALCITONIN  HIV ANTIBODY (ROUTINE TESTING W REFLEX)  BASIC METABOLIC PANEL  CBC  I-STAT CG4 LACTIC ACID, ED    EKG EKG Interpretation  Date/Time:  Friday June 26 2018 08:52:19 EST Ventricular Rate:  136 PR Interval:    QRS Duration: 119 QT Interval:  288 QTC Calculation: 434 R Axis:   -91 Text Interpretation:  Sinus or ectopic atrial tachycardia Right bundle branch block LVH with IVCD and secondary repol abnrm Inferior infarct, old Baseline wander in lead(s) V1 increased rate from prior 10/17 Confirmed by Meridee Score [phone]) on 06/26/2018 9:01:53 AM   Radiology Dg Chest Port 1 View  Result Date: 06/26/2018 CLINICAL DATA:  Shortness of breath and cough for few days EXAM: PORTABLE CHEST 1 VIEW COMPARISON:  July 26, 2014 FINDINGS: The heart size and mediastinal contours are stable. Patient status post prior median sternotomy and CABG. Both lungs are clear. The visualized skeletal structures are stable. IMPRESSION: No active cardiopulmonary disease. Electronically Signed   By: Sherian Rein M.D.   On: 06/26/2018 09:39    Procedures Procedures (including critical care time)  Medications Ordered in ED Medications  ipratropium-albuterol (DUONEB) 0.5-2.5 (3) MG/3ML nebulizer solution 3 mL (has no administration in time range)  magnesium sulfate IVPB 2 g 50 mL (has no administration in time range)  methylPREDNISolone sodium succinate (SOLU-MEDROL) 125 mg/2 mL injection 125 mg (has no administration in time range)     Initial Impression / Assessment and Plan / ED Course  I have reviewed the triage vital signs and the nursing notes.  Pertinent labs & imaging results that were available during my care of the patient  were reviewed by me and considered in my medical decision making (see chart for details).  Clinical Course as of Jun 26 1722  Caleen Essex Jun 26, 2018  1057 Patient with Sirs criteria but a normal lactate.  Patient covered empirically with ceftriaxone and Zithromax.  He is received fluid boluses.  I  discussed with the hospitalist Dr. Clearnce Sorrel accepts for admission.  Patient updated on results.   [MB]    Clinical Course User Index [MB] Terrilee Files, MD      Final Clinical Impressions(s) / ED Diagnoses   Final diagnoses:  SIRS (systemic inflammatory response syndrome) (HCC)  COPD exacerbation [facility])    ED Discharge Orders    None       Terrilee Files, MD 06/26/18 1725

## 2018-06-26 NOTE — ED Notes (Signed)
CRITICAL VALUE ALERT  Critical Value:  Trop. 0.03  Date & Time Notied:  06/26/18 1207  Provider Notified: Dr. Charm Barges  Orders Received/Actions taken: See chart

## 2018-06-26 NOTE — ED Notes (Signed)
Notified RT of neb tx due

## 2018-06-26 NOTE — ED Notes (Signed)
Placed on 2l o2 via [place] sats increased to 93%

## 2018-06-26 NOTE — ED Triage Notes (Signed)
Increased sob, cough and congestion for past few days.  resp labored

## 2018-06-27 LAB — CBC
HCT: 49.3 % (ref 39.0–52.0)
Hemoglobin: 16 g/dL (ref 13.0–17.0)
MCH: 31.5 pg (ref 26.0–34.0)
MCHC: 32.5 g/dL (ref 30.0–36.0)
MCV: 97 fL (ref 80.0–100.0)
Platelets: 197 K/uL (ref 150–400)
RBC: 5.08 MIL/uL (ref 4.22–5.81)
RDW: 13.7 % (ref 11.5–15.5)
WBC: 13 10*3/uL — ABNORMAL HIGH (ref 4.0–10.5)
nRBC: 0 % (ref 0.0–0.2)

## 2018-06-27 LAB — BASIC METABOLIC PANEL
BUN: 22 mg/dL (ref 8–23)
CO2: 26 mmol/L (ref 22–32)
Calcium: 8.5 mg/dL — ABNORMAL LOW (ref 8.9–10.3)
GFR calc non Af Amer: >60 mL/min (ref >60)
Glucose, Bld: 153 mg/dL — ABNORMAL HIGH (ref 70–99)
Potassium: 4.3 mmol/L (ref 3.5–5.1)

## 2018-06-27 LAB — HIV ANTIBODY (ROUTINE TESTING W REFLEX): HIV Screen 4th Generation wRfx: NONREACTIVE

## 2018-06-27 LAB — BASIC METABOLIC PANEL WITH GFR
Anion gap: 9 (ref 5–15)
Chloride: 103 mmol/L (ref 98–111)
Creatinine, Ser: 0.86 mg/dL (ref 0.61–1.24)
GFR calc Af Amer: >60 mL/min (ref >60)
Sodium: 138 mmol/L (ref 135–145)

## 2018-06-27 LAB — GLUCOSE, CAPILLARY: Glucose-Capillary: 129 mg/dL — ABNORMAL HIGH (ref 70–99)

## 2018-06-27 MED ORDER — AZITHROMYCIN 500 MG PO TABS: 500.0000 mg | ORAL_TABLET | Freq: Every day | ORAL | 0 refills | Status: AC

## 2018-06-27 MED ORDER — PREDNISONE 50 MG PO TABS: 50.0000 mg | ORAL_TABLET | Freq: Every day | ORAL | 0 refills | Status: AC

## 2018-06-27 NOTE — Progress Notes (Signed)
IV removed, patient tolerated well.  Reviewed AVS with patient who verbalized understanding.  Patient transported home by his wife.   

## 2018-06-27 NOTE — Discharge Instructions (Signed)

## 2018-06-27 NOTE — Discharge Summary (Signed)
Physician Discharge Summary  Mark Rios ZOX:[id number] DOB: 1951-06-22 DOA: 06/26/2018  PCP: Gareth Morgan, MD  Admit date: 06/26/2018 Discharge date: 06/27/2018  Admitted From: Home Disposition: Home  Recommendations for Outpatient Follow-up:  1. Follow up with PCP in 1-2 weeks 2. Please obtain BMP/CBC in one week 3. Blood cultures from 06/26/2018   Home Health: No Equipment/Devices: None  Discharge Condition: Stable CODE STATUS: Full Diet recommendation: Heart healthy  Brief/Interim Summary:  #) Sepsis from unclear source: Patient was admitted with shortness of breath and wheezing and cough as well as fever.  His chest x-ray was clear.  He was noted to be wheezing on exam and was treated for COPD exacerbation (see below).  His flu was negative and his respiratory virus panel was negative.  His blood cultures from 06/26/2018 were negative.  His procalcitonin was undetectable.  It was thought the patient had virus likely causing COPD exacerbation causing his fever.  He was initially treated with empiric doxycycline however this was narrowed to azithromycin for 3 days on discharge.  #) Acute hypoxic respiratory failure/COPD exacerbation: Patient was admitted with acute hypoxic respiratory failure and COPD exacerbation.  He was given IV methyl prednisolone and scheduled bronchodilators and IV doxycycline.  He was rapidly weaned off oxygen.  He was discharged home on oral azithromycin and oral steroids.  He was told to continue his steroids for 5 days and complete his course of oral azithromycin.  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  It is unclear what diagnosis he carries as he does not carry a diagnosis of rheumatoid arthritis.  He has not been taking his oral steroids for some time.  Did not need stress to steroids.  #) Coronary artery disease status post CABG: Patient was continued on  lisinopril, statin, ACE inhibitor.  #) Hypertension/hyperlipidemia: Patient was continued on simvastatin,, carvedilol, lisinopril.  #) Type 2 diabetes: Patient was maintained on sliding scale here.  His home metformin was held.  Discharge Diagnoses:  Principal Problem:   Sepsis (HCC) Active Problems:   Hypoxia   HLD (hyperlipidemia)   HTN (hypertension)   Immunosuppression due to chronic steroid use   COPD (chronic obstructive pulmonary disease) (HCC)   CAD (coronary artery disease)   S/P CABG (coronary artery bypass graft)    Discharge Instructions  Discharge Instructions    Call MD for:  difficulty breathing, headache or visual disturbances   Complete by:  As directed    Call MD for:  hives   Complete by:  As directed    Call MD for:  persistant nausea and vomiting   Complete by:  As directed    Call MD for:  redness, tenderness, or signs of infection (pain, swelling, redness, odor or green/yellow discharge around incision site)   Complete by:  As directed    Call MD for:  severe uncontrolled pain   Complete by:  As directed    Call MD for:  temperature >100.4   Complete by:  As directed    Diet - low sodium heart healthy   Complete by:  As directed    Discharge instructions   Complete by:  As directed    Please follow-up with your primary care doctor in 1 week.  Please take your steroids and antibiotics as prescribed.  Please take your breathing treatments every 4 hours for a few days and then wean yourself off them over several days.   Increase  activity slowly   Complete by:  As directed      Allergies as of 06/27/2018   No Known Allergies     Medication List    TAKE these medications   albuterol 108 (90 Base) MCG/ACT inhaler Commonly known as:  PROVENTIL HFA;VENTOLIN HFA Inhale 2 puffs into the lungs every 6 (six) hours as needed for wheezing or shortness of breath.   albuterol (2.5 MG/3ML) 0.083% nebulizer solution Commonly known as:  PROVENTIL Take 2.5  mg by nebulization every 4 (four) hours as needed for wheezing.   aspirin 325 MG EC tablet Take 325 mg by mouth at bedtime.   azithromycin 500 MG tablet Commonly known as:  ZITHROMAX Take 1 tablet (500 mg total) by mouth daily for 3 days. Take 1 tablet daily for 3 days.   carvedilol 12.5 MG tablet Commonly known as:  COREG Take 12.5 mg by mouth 2 (two) times daily with a meal.   ipratropium 0.02 % nebulizer solution Commonly known as:  ATROVENT Take 500 mcg by nebulization every 4 (four) hours as needed for wheezing.   lisinopril 10 MG tablet Commonly known as:  PRINIVIL,ZESTRIL Take 10 mg by mouth daily.   metFORMIN 500 MG tablet Commonly known as:  GLUCOPHAGE Take 500 mg by mouth 2 (two) times daily with a meal. Patient takes 1000 mg in the morning and 500 mg at supper   oxyCODONE-acetaminophen 7.5-325 MG tablet Commonly known as:  PERCOCET Take 1-2 tablets by mouth every 4 (four) hours as needed.   predniSONE 10 MG tablet Commonly known as:  DELTASONE Take 10 mg by mouth every other day. As needed arthritis What changed:  Another medication with the same name was added. Make sure you understand how and when to take each.   predniSONE 50 MG tablet Commonly known as:  DELTASONE Take 1 tablet (50 mg total) by mouth daily for 4 days. What changed:  You were already taking a medication with the same name, and this prescription was added. Make sure you understand how and when to take each.   simvastatin 40 MG tablet Commonly known as:  ZOCOR Take 40 mg by mouth every evening.       No Known Allergies  Consultations:  None   Procedures/Studies: Dg Chest Port 1 View  Result Date: 06/26/2018 CLINICAL DATA:  Shortness of breath and cough for few days EXAM: PORTABLE CHEST 1 VIEW COMPARISON:  July 26, 2014 FINDINGS: The heart size and mediastinal contours are stable. Patient status post prior median sternotomy and CABG. Both lungs are clear. The visualized skeletal  structures are stable. IMPRESSION: No active cardiopulmonary disease. Electronically Signed   By: Sherian Rein M.D.   On: 06/26/2018 09:39       Subjective:   Discharge Exam: Vitals:   06/27/18 0555 06/27/18 0841  BP: 131/67   Pulse: 87   Resp: 20   Temp: (!) 97.4 F (36.3 C)   SpO2: 93% (!) 87%   Vitals:   06/26/18 2219 06/27/18 0157 06/27/18 0555 06/27/18 0841  BP: 133/73  131/67   Pulse: (!) 103  87   Resp: 20  20   Temp: 97.7 F (36.5 C)  (!) 97.4 F (36.3 C)   TempSrc: Oral  Oral   SpO2: 92% (!) 88% 93% (!) 87%  Weight:      Height:        General: Pt is alert, awake, not in acute distress Cardiovascular: RRR, S1/S2 +, no rubs, no gallops  Respiratory: CTA bilaterally, no wheezing, no rhonchi Abdominal: Soft, NT, ND, bowel sounds + Extremities: no edema    The results of significant diagnostics from this hospitalization (including imaging, microbiology, ancillary and laboratory) are listed below for reference.     Microbiology: Recent Results (from the past 240 hour(s))  Blood Culture (routine x 2)     Status: None (Preliminary result)   Collection Time: 06/26/18  9:26 AM  Result Value Ref Range Status   Specimen Description BLOOD RIGHT ARM  Final   Special Requests   Final    BOTTLES DRAWN AEROBIC AND ANAEROBIC Blood Culture adequate volume   Culture   Final    NO GROWTH < 24 HOURS Performed at [facility], [address] [place], [place] [address]    Report Status PENDING  Incomplete  Blood Culture (routine x 2)     Status: None (Preliminary result)   Collection Time: 06/26/18  9:26 AM  Result Value Ref Range Status   Specimen Description BLOOD RIGHT HAND  Final   Special Requests   Final    BOTTLES DRAWN AEROBIC AND ANAEROBIC Blood Culture adequate volume   Culture   Final    NO GROWTH < 24 HOURS Performed at [facility], [address], [place], [place] [address]    Report Status PENDING  Incomplete  Respiratory Panel by PCR     Status:  None   Collection Time: 06/26/18 11:10 AM  Result Value Ref Range Status   Adenovirus NOT DETECTED NOT DETECTED Final   Coronavirus 229E NOT DETECTED NOT DETECTED Final   Coronavirus HKU1 NOT DETECTED NOT DETECTED Final   Coronavirus NL63 NOT DETECTED NOT DETECTED Final   Coronavirus OC43 NOT DETECTED NOT DETECTED Final   Metapneumovirus NOT DETECTED NOT DETECTED Final   Rhinovirus / Enterovirus NOT DETECTED NOT DETECTED Final   Influenza A NOT DETECTED NOT DETECTED Final   Influenza B NOT DETECTED NOT DETECTED Final   Parainfluenza Virus 1 NOT DETECTED NOT DETECTED Final   Parainfluenza Virus 2 NOT DETECTED NOT DETECTED Final   Parainfluenza Virus 3 NOT DETECTED NOT DETECTED Final   Parainfluenza Virus 4 NOT DETECTED NOT DETECTED Final   Respiratory Syncytial Virus NOT DETECTED NOT DETECTED Final   Bordetella pertussis NOT DETECTED NOT DETECTED Final   Chlamydophila pneumoniae NOT DETECTED NOT DETECTED Final   Mycoplasma pneumoniae NOT DETECTED NOT DETECTED Final    Comment: Performed at [facility], [address], [place], [place] [address]     Labs: BNP (last 3 results) Recent Labs    06/26/18 0922  BNP 249.0*   Basic Metabolic Panel: Recent Labs  Lab 06/26/18 0922 06/27/18 0648  NA 132* 138  K 4.2 4.3  CL 96* 103  CO2 25 26  GLUCOSE 128* 153*  BUN 19 22  CREATININE 0.87 0.86  CALCIUM 8.9 8.5*   Liver Function Tests: Recent Labs  Lab 06/26/18 0922  AST 22  ALT 19  ALKPHOS 57  BILITOT 1.2  PROT 7.7  ALBUMIN 4.4   No results for input(s): LIPASE, AMYLASE in the last 168 hours. No results for input(s): AMMONIA in the last 168 hours. CBC: Recent Labs  Lab 06/26/18 0916 06/27/18 0648  WBC 10.6* 13.0*  NEUTROABS 7.8*  --   HGB 17.2* 16.0  HCT 51.9 49.3  MCV 93.5 97.0  PLT 194 197   Cardiac Enzymes: Recent Labs  Lab 06/26/18 0916  TROPONINI 0.03*   BNP: Invalid input(s): POCBNP CBG: Recent  Labs  Lab 06/26/18 1603 06/26/18 1711  06/26/18 2136 06/27/18 0731  GLUCAP 254* 277* 152* 129*   D-Dimer No results for input(s): DDIMER in the last 72 hours. Hgb A1c No results for input(s): HGBA1C in the last 72 hours. Lipid Profile No results for input(s): CHOL, HDL, LDLCALC, TRIG, CHOLHDL, LDLDIRECT in the last 72 hours. Thyroid function studies No results for input(s): TSH, T4TOTAL, T3FREE, THYROIDAB in the last 72 hours.  Invalid input(s): FREET3 Anemia work up No results for input(s): VITAMINB12, FOLATE, FERRITIN, TIBC, IRON, RETICCTPCT in the last 72 hours. Urinalysis    Component Value Date/Time   COLORURINE YELLOW 06/26/2018 1105   APPEARANCEUR CLEAR 06/26/2018 1105   LABSPEC 1.013 06/26/2018 1105   PHURINE 5.0 06/26/2018 1105   GLUCOSEU NEGATIVE 06/26/2018 1105   HGBUR LARGE (A) 06/26/2018 1105   BILIRUBINUR NEGATIVE 06/26/2018 1105   KETONESUR 20 (A) 06/26/2018 1105   PROTEINUR 30 (A) 06/26/2018 1105   UROBILINOGEN 0.2 11/18/2014 1240   NITRITE NEGATIVE 06/26/2018 1105   LEUKOCYTESUR NEGATIVE 06/26/2018 1105   Sepsis Labs Invalid input(s): PROCALCITONIN,  WBC,  LACTICIDVEN Microbiology Recent Results (from the past 240 hour(s))  Blood Culture (routine x 2)     Status: None (Preliminary result)   Collection Time: 06/26/18  9:26 AM  Result Value Ref Range Status   Specimen Description BLOOD RIGHT ARM  Final   Special Requests   Final    BOTTLES DRAWN AEROBIC AND ANAEROBIC Blood Culture adequate volume   Culture   Final    NO GROWTH < 24 HOURS Performed at [facility], [address], [place], [place] [address]    Report Status PENDING  Incomplete  Blood Culture (routine x 2)     Status: None (Preliminary result)   Collection Time: 06/26/18  9:26 AM  Result Value Ref Range Status   Specimen Description BLOOD RIGHT HAND  Final   Special Requests   Final    BOTTLES DRAWN AEROBIC AND ANAEROBIC Blood Culture adequate volume   Culture   Final    NO GROWTH < 24 HOURS Performed at [facility], [address], [place], [place] [address]    Report Status PENDING  Incomplete  Respiratory Panel by PCR     Status: None   Collection Time: 06/26/18 11:10 AM  Result Value Ref Range Status   Adenovirus NOT DETECTED NOT DETECTED Final   Coronavirus 229E NOT DETECTED NOT DETECTED Final   Coronavirus HKU1 NOT DETECTED NOT DETECTED Final   Coronavirus NL63 NOT DETECTED NOT DETECTED Final   Coronavirus OC43 NOT DETECTED NOT DETECTED Final   Metapneumovirus NOT DETECTED NOT DETECTED Final   Rhinovirus / Enterovirus NOT DETECTED NOT DETECTED Final   Influenza A NOT DETECTED NOT DETECTED Final   Influenza B NOT DETECTED NOT DETECTED Final   Parainfluenza Virus 1 NOT DETECTED NOT DETECTED Final   Parainfluenza Virus 2 NOT DETECTED NOT DETECTED Final   Parainfluenza Virus 3 NOT DETECTED NOT DETECTED Final   Parainfluenza Virus 4 NOT DETECTED NOT DETECTED Final   Respiratory Syncytial Virus NOT DETECTED NOT DETECTED Final   Bordetella pertussis NOT DETECTED NOT DETECTED Final   Chlamydophila pneumoniae NOT DETECTED NOT DETECTED Final   Mycoplasma pneumoniae NOT DETECTED NOT DETECTED Final    Comment: Performed at [facility], [address] [place], [place] [address]     Time coordinating discharge: 63 SIGNED:   Delaine Lame, MD  [facility] 06/27/2018, 9:36 AM  If 7PM-7AM,  please contact night-coverage [email or web address] Password [email or web address]

## 2018-07-01 LAB — CULTURE, BLOOD (ROUTINE X 2)
CULTURE: NO GROWTH
CULTURE: NO GROWTH
SPECIAL REQUESTS: ADEQUATE
Special Requests: ADEQUATE

## 2018-07-06 ENCOUNTER — Other Ambulatory Visit (HOSPITAL_COMMUNITY)
Admission: RE | Admit: 2018-07-06 | Discharge: 2018-07-06 | Disposition: A | Discharge status: Home / Self Care | Payer: Medicare HMO | Source: Ambulatory Visit | Attending: Family Medicine | Admitting: Family Medicine

## 2018-07-06 DIAGNOSIS — J441 Chronic obstructive pulmonary disease with (acute) exacerbation: Secondary | ICD-10-CM | POA: Diagnosis present

## 2018-07-06 LAB — BASIC METABOLIC PANEL
Anion gap: 7 (ref 5–15)
BUN: 18 mg/dL (ref 8–23)
CO2: 31 mmol/L (ref 22–32)
CREATININE: 0.95 mg/dL (ref 0.61–1.24)
Calcium: 9.5 mg/dL (ref 8.9–10.3)
Chloride: 101 mmol/L (ref 98–111)
GFR calc Af Amer: >60 mL/min (ref >60)
GLUCOSE: 113 mg/dL — AB (ref 70–99)
POTASSIUM: 4.5 mmol/L (ref 3.5–5.1)
SODIUM: 139 mmol/L (ref 135–145)

## 2018-07-06 LAB — CBC WITH DIFFERENTIAL/PLATELET
Abs Immature Granulocytes: 0.16 10*3/uL — ABNORMAL HIGH (ref 0.00–0.07)
BASOS ABS: 0.1 10*3/uL (ref 0.0–0.1)
Basophils Relative: 1 %
EOS ABS: 0.4 10*3/uL (ref 0.0–0.5)
EOS PCT: 3 %
HEMATOCRIT: 47.7 % (ref 39.0–52.0)
HEMOGLOBIN: 15.5 g/dL (ref 13.0–17.0)
Immature Granulocytes: 1 %
LYMPHS PCT: 21 %
Lymphs Abs: 2.7 10*3/uL (ref 0.7–4.0)
MCH: 31.3 pg (ref 26.0–34.0)
MCHC: 32.5 g/dL (ref 30.0–36.0)
MCV: 96.2 fL (ref 80.0–100.0)
MONOS PCT: 9 %
Monocytes Absolute: 1.2 10*3/uL — ABNORMAL HIGH (ref 0.1–1.0)
NEUTROS ABS: 8.5 10*3/uL — AB (ref 1.7–7.7)
Neutrophils Relative %: 65 %
Platelets: 314 10*3/uL (ref 150–400)
RBC: 4.96 MIL/uL (ref 4.22–5.81)
RDW: 13.4 % (ref 11.5–15.5)
WBC: 13 10*3/uL — ABNORMAL HIGH (ref 4.0–10.5)
nRBC: 0 % (ref 0.0–0.2)

## 2019-04-09 ENCOUNTER — Other Ambulatory Visit (HOSPITAL_COMMUNITY): Payer: Self-pay | Admitting: Nurse Practitioner

## 2019-04-09 ENCOUNTER — Other Ambulatory Visit: Payer: Self-pay

## 2019-04-09 ENCOUNTER — Ambulatory Visit (HOSPITAL_COMMUNITY)
Admission: RE | Admit: 2019-04-09 | Discharge: 2019-04-09 | Disposition: A | Discharge status: Home / Self Care | Payer: Medicare HMO | Source: Ambulatory Visit | Attending: Nurse Practitioner | Admitting: Nurse Practitioner

## 2019-04-09 DIAGNOSIS — R52 Pain, unspecified: Secondary | ICD-10-CM | POA: Insufficient documentation

## 2019-04-09 DIAGNOSIS — M79605 Pain in left leg: Secondary | ICD-10-CM

## 2019-11-04 ENCOUNTER — Other Ambulatory Visit: Payer: Self-pay | Admitting: *Deleted

## 2019-11-04 DIAGNOSIS — I83893 Varicose veins of bilateral lower extremities with other complications: Secondary | ICD-10-CM

## 2019-11-05 ENCOUNTER — Ambulatory Visit: Payer: Medicare HMO | Admitting: Vascular Surgery

## 2019-11-05 ENCOUNTER — Other Ambulatory Visit: Payer: Self-pay

## 2019-11-05 ENCOUNTER — Ambulatory Visit (HOSPITAL_COMMUNITY)
Admission: RE | Admit: 2019-11-05 | Discharge: 2019-11-05 | Disposition: A | Discharge status: Home / Self Care | Payer: Medicare HMO | Source: Ambulatory Visit | Attending: Vascular Surgery | Admitting: Vascular Surgery

## 2019-11-05 ENCOUNTER — Encounter: Payer: Self-pay | Admitting: Vascular Surgery

## 2019-11-05 VITALS — BP 109/68 | HR 79 | Temp 97.5°F | Resp 20 | Ht 67.0 in | Wt 151.0 lb

## 2019-11-05 DIAGNOSIS — I83893 Varicose veins of bilateral lower extremities with other complications: Secondary | ICD-10-CM | POA: Insufficient documentation

## 2019-11-05 DIAGNOSIS — I781 Nevus, non-neoplastic: Secondary | ICD-10-CM | POA: Diagnosis not present

## 2019-11-05 DIAGNOSIS — R0989 Other specified symptoms and signs involving the circulatory and respiratory systems: Secondary | ICD-10-CM

## 2019-11-05 NOTE — Progress Notes (Signed)
Patient ID: Mark Rios, male   DOB: August 01, 1951, 69 y.o.   MRN: [id number]  Reason for Consult: New Patient (Initial Visit)   Referred by Gareth Morgan, MD  Subjective:     HPI:  Mark Rios is a 68 y.o. male history coronary artery disease status post bypass many years ago using right greater saphenous vein.  Also has history of stroke with remote history of stenting on the left side but he does not remember where.  Has not been followed for carotid disease.  Presents today for evaluation of his lower extremity veins.  He had an episode on both legs where he was bathing with hot water and began to bleed.  He has significant spider veins bilaterally has never had intervention.  Denies history of DVT.  Risk factors for vascular disease include diabetes, hypertension, hyperlipidemia and previous coronary artery disease.  He takes aspirin does not take blood thinners.  Past Medical History:  Diagnosis Date  . Abdominal hernia   . Arrhythmia   . Arthritis   . CAD (coronary artery disease) 06/26/2018  . COPD (chronic obstructive pulmonary disease) (HCC)   . Diabetes mellitus without complication (HCC)   . High cholesterol   . History of kidney stones    history of kidney stones  . HLD (hyperlipidemia) 06/26/2018  . HTN (hypertension) 06/26/2018  . Hypertension   . Pneumothorax   . 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 Stroke [facility]) 2010   right leg slightly weaker   . Traumatic diaphragmatic hernia    Repaired 07/01/2014   Family History  Problem Relation Age of Onset  . Coronary artery disease Father    Past Surgical History:  Procedure Laterality Date  . CAROTID ARTERY ANGIOPLASTY Left    2007  . CORONARY ARTERY BYPASS GRAFT     x3  . HEMORROIDECTOMY    . INGUINAL HERNIA REPAIR Right 08/31/2014   Procedure: HERNIA REPAIR INGUINAL ADULT;  Surgeon: Dalia Heading, MD;  Location: [facility];  Service: General;  Laterality: Right;  . INGUINAL HERNIA REPAIR  Left 06/12/2016   Procedure: HERNIA REPAIR INGUINAL ADULT WITH MESH;  Surgeon: Franky Macho, MD;  Location: [facility];  Service: General;  Laterality: Left;  . INSERTION OF MESH Right 08/31/2014   Procedure: INSERTION OF MESH;  Surgeon: Dalia Heading, MD;  Location: [facility];  Service: General;  Laterality: Right;  inguinal hernia  . UMBILICAL HERNIA REPAIR N/A 08/31/2014   Procedure: HERNIA REPAIR UMBILICAL ADULT ;  Surgeon: Dalia Heading, MD;  Location: [facility];  Service: General;  Laterality: N/A;  . VIDEO ASSISTED THORACOSCOPY (VATS)/THOROCOTOMY Left 07/01/2014   Procedure: with repair left diaphragmatic hernia ;  Surgeon: Loreli Slot, MD;  Location: [facility] OR;  Service: Thoracic;  Laterality: Left;  Marland Kitchen VIDEO BRONCHOSCOPY N/A 07/01/2014   Procedure: VIDEO BRONCHOSCOPY;  Surgeon: Loreli Slot, MD;  Location[facility] OR;  Service: Thoracic;  Laterality: N/A;    Short Social History:  Social History   Tobacco Use  . Smoking status: Former Smoker    Packs/day: 0.50    Years: 48.00    Pack years: 24.00    Types: Cigarettes    Quit date: 06/23/2018    Years since quitting: 1.3  . Smokeless tobacco: Never Used  Substance Use Topics  . Alcohol use: No    No Known Allergies  Current Outpatient Medications  Medication Sig Dispense Refill  . acetaminophen (TYLENOL) 500 MG tablet Take  1,000 mg by mouth every 12 (twelve) hours as needed.    Marland Kitchen albuterol (PROVENTIL HFA;VENTOLIN HFA) 108 (90 BASE) MCG/ACT inhaler Inhale 2 puffs into the lungs every 6 (six) hours as needed for wheezing or shortness of breath.    Marland Kitchen albuterol (PROVENTIL) (2.5 MG/3ML) 0.083% nebulizer solution Take 2.5 mg by nebulization every 4 (four) hours as needed for wheezing.    Marland Kitchen aspirin 325 MG EC tablet Take 325 mg by mouth at bedtime.     . carvedilol (COREG) 12.5 MG tablet Take 12.5 mg by mouth 2 (two) times daily with a meal.     . ipratropium (ATROVENT) 0.02 % nebulizer solution Take 500 mcg by nebulization every 4  (four) hours as needed for wheezing.    Marland Kitchen lisinopril (PRINIVIL,ZESTRIL) 10 MG tablet Take 10 mg by mouth daily.    . metFORMIN (GLUCOPHAGE) 500 MG tablet Take 500 mg by mouth 2 (two) times daily with a meal. Patient takes 1000 mg in the morning and 500 mg at supper    . simvastatin (ZOCOR) 40 MG tablet Take 40 mg by mouth every evening.     No current facility-administered medications for this visit.    Review of Systems  Constitutional:  Constitutional negative. HENT: HENT negative.  Eyes: Eyes negative.  Respiratory: Respiratory negative.  Cardiovascular: Positive for claudication.  GI: Gastrointestinal negative.  Musculoskeletal: Musculoskeletal negative.  Skin: Skin negative.  Neurological: Neurological negative. Hematologic: Positive for bruises/bleeds easily.  Psychiatric: Psychiatric negative.        Objective:  Objective   Vitals:   11/05/19 1036  BP: 109/68  Pulse: 79  Resp: 20  Temp: (!) 97.5 F (36.4 C)  SpO2: 99%  Weight: 151 lb (68.5 kg)  Height: 5\' 7"  (1.702 m)   Body mass index is 23.65 kg/m.  Physical Exam HENT:     Head: Normocephalic.     Nose:     Comments: Mask in place    Mouth/Throat:     Mouth: Mucous membranes are moist.  Eyes:     Pupils: Pupils are equal, round, and reactive to light.  Neck:     Vascular: Carotid bruit present.  Cardiovascular:     Rate and Rhythm: Normal rate.     Pulses:          Radial pulses are 2+ on the right side and 2+ on the left side.       Femoral pulses are 2+ on the right side and 2+ on the left side.      Popliteal pulses are 2+ on the right side and 0 on the left side.       Dorsalis pedis pulses are 0 on the left side.       Posterior tibial pulses are 2+ on the right side and 0 on the left side.  Abdominal:     General: Abdomen is flat.     Palpations: Abdomen is soft.  Musculoskeletal:     Cervical back: Neck supple.     Right lower leg: No edema.     Left lower leg: No edema.  Skin:     General: Skin is warm.     Capillary Refill: Capillary refill takes less than 2 seconds.     Comments: He has bilateral telangiectasias  Neurological:     General: No focal deficit present.     Mental Status: He is alert.  Psychiatric:        Mood and Affect: Mood normal.  Behavior: Behavior normal.        Thought Content: Thought content normal.        Judgment: Judgment normal.     Data: I have interpreted his venous reflux studies.  Right greater saphenous vein has been harvested.  No reflux noted on the left side.  Incidental finding of occluded left SFA.     Assessment/Plan:     69 year old male with history of coronary artery disease.  Recently bled from 2 sites of what appeared to be telangiectasias.  He has C1 vascular disease with complication of bleeding.  He would be a candidate possibly for sclerotherapy I discussed this with him.  I will get him to follow-up in 4 weeks for carotid duplex at that time we can look at his telangiectasias as well.     Maeola Harman MD [facility]

## 2019-11-08 ENCOUNTER — Other Ambulatory Visit: Payer: Self-pay | Admitting: *Deleted

## 2019-11-08 DIAGNOSIS — R0989 Other specified symptoms and signs involving the circulatory and respiratory systems: Secondary | ICD-10-CM

## 2019-12-14 ENCOUNTER — Ambulatory Visit: Payer: Medicare HMO

## 2019-12-14 ENCOUNTER — Inpatient Hospital Stay (HOSPITAL_COMMUNITY): Admission: RE | Admit: 2019-12-14 | Payer: Medicare HMO | Source: Ambulatory Visit

## 2019-12-20 ENCOUNTER — Ambulatory Visit: Payer: Medicare HMO

## 2020-07-20 ENCOUNTER — Encounter: Payer: Self-pay | Admitting: Cardiology

## 2020-07-20 ENCOUNTER — Ambulatory Visit: Payer: Medicare HMO | Admitting: Cardiology

## 2020-07-20 ENCOUNTER — Other Ambulatory Visit: Payer: Self-pay

## 2020-07-20 ENCOUNTER — Ambulatory Visit (INDEPENDENT_AMBULATORY_CARE_PROVIDER_SITE_OTHER): Payer: Medicare HMO

## 2020-07-20 VITALS — BP 142/78 | HR 91 | Ht 68.0 in | Wt 166.4 lb

## 2020-07-20 DIAGNOSIS — I25119 Atherosclerotic heart disease of native coronary artery with unspecified angina pectoris: Secondary | ICD-10-CM | POA: Diagnosis not present

## 2020-07-20 DIAGNOSIS — E782 Mixed hyperlipidemia: Secondary | ICD-10-CM | POA: Diagnosis not present

## 2020-07-20 DIAGNOSIS — R002 Palpitations: Secondary | ICD-10-CM

## 2020-07-20 NOTE — Addendum Note (Signed)
Addended by: Kerney Elbe on: 07/20/2020 04:50 PM   Modules accepted: Orders

## 2020-07-20 NOTE — Patient Instructions (Signed)
Medication Instructions:  Your physician recommends that you continue on your current medications as directed. Please refer to the Current Medication list given to you today.  *If you need a refill on your cardiac medications before your next appointment, please call your pharmacy*   Lab Work: None today If you have labs (blood work) drawn today and your tests are completely normal, you will receive your results only by: Marland Kitchen MyChart Message (if you have MyChart) OR . A paper copy in the mail If you have any lab test that is abnormal or we need to change your treatment, we will call you to review the results.   Testing/Procedures: Your physician has requested that you have an echocardiogram. Echocardiography is a painless test that uses sound waves to create images of your heart. It provides your doctor with information about the size and shape of your heart and how well your heart's chambers and valves are working. This procedure takes approximately one hour. There are no restrictions for this procedure.  Your physician has recommended that you wear an event monitor (7 day ZIO) . Event monitors are medical devices that record the heart's electrical activity. Doctors most often [place] these monitors to diagnose arrhythmias. Arrhythmias are problems with the speed or rhythm of the heartbeat. The monitor is a small, portable device. You can wear one while you do your normal daily activities. This is usually used to diagnose what is causing palpitations/syncope (passing out).     Follow-Up: At [facility], you and your health needs are our priority.  As part of our continuing mission to provide you with exceptional heart care, we have created designated Provider Care Teams.  These Care Teams include your primary Cardiologist (physician) and Advanced Practice Providers (APPs -  Physician Assistants and Nurse Practitioners) who all work together to provide you with the care you need, when you need  it.  We recommend signing up for the patient portal called "MyChart".  Sign up information is provided on this After Visit Summary.  MyChart is used to connect with patients for Virtual Visits (Telemedicine).  Patients are able to view lab/test results, encounter notes, upcoming appointments, etc.  Non-urgent messages can be sent to your provider as well.   To learn more about what you can do with MyChart, go to [email or web address].    Your next appointment:  We will call you with test results     Thank you for choosing [facility] !

## 2020-07-20 NOTE — Progress Notes (Signed)
Cardiology Office Note  Date: 07/20/2020   ID: Mark Rios, DOB 1950-10-15, MRN [id number]  PCP:  Mark Morgan, MD  Cardiologist:  Mark Dell, MD Electrophysiologist:  None   Chief Complaint  Patient presents with  . Palpitations    History of Present Illness: Mark Rios is a 69 y.o. male referred for cardiology consultation by Ms. Gaynell Face NP for the evaluation of palpitations.  I reviewed his records and updated the chart.  He has a history of multivessel CAD status post CABG in 2010, also carotid artery disease, previous stroke, and hyperlipidemia.  He reports compliance with his medications as outlined below.  He does not describe any obvious angina symptoms at this time.  He walks his dog about a half a mile each day.  He does have chronic bilateral leg pain and also trouble with venous varicosities.  He was seen by Mark Rios with [facility] and plans to return for further evaluation.  He reports experiencing a sensation of "fluttering" in his chest for the last several weeks to months.  This happens at nighttime when he is laying down, he does not notice any symptoms during the daytime.  No associated syncope.  Symptoms occur a few times in a week.  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  Past Medical History:  Diagnosis Date  . Arthritis   . CAD (coronary artery disease)    Multivessel status post CABG 2010 (LIMA to LAD, SVG to OM1, SVG to PDA - Dr. Dorris Rios)  . Carotid artery disease (HCC)   . Cervical disc disease   . COPD (chronic obstructive pulmonary disease) (HCC)   . Erectile dysfunction   . Essential hypertension   . History of kidney stones       . History of stroke 2010  . Hyperlipidemia   . Hypertension   . Pneumothorax   . Traumatic diaphragmatic hernia    Repaired 07/01/2014  . Type 2 diabetes mellitus (HCC)     Past  Surgical History:  Procedure Laterality Date  . CAROTID ARTERY ANGIOPLASTY Left    2007  . CORONARY ARTERY BYPASS GRAFT     x3  . HEMORROIDECTOMY    . INGUINAL HERNIA REPAIR Right 08/31/2014   Procedure: HERNIA REPAIR INGUINAL ADULT;  Surgeon: Mark Heading, MD;  Location: [facility];  Service: General;  Laterality: Right;  . INGUINAL HERNIA REPAIR Left 06/12/2016   Procedure: HERNIA REPAIR INGUINAL ADULT WITH MESH;  Surgeon: Mark Macho, MD;  Location: [facility];  Service: General;  Laterality: Left;  . INSERTION OF MESH Right 08/31/2014   Procedure: INSERTION OF MESH;  Surgeon: Mark Heading, MD;  Location: [facility];  Service: General;  Laterality: Right;  inguinal hernia  . UMBILICAL HERNIA REPAIR N/A 08/31/2014   Procedure: HERNIA REPAIR UMBILICAL ADULT ;  Surgeon: Mark Heading, MD;  Location: [facility];  Service: General;  Laterality: N/A;  . VIDEO ASSISTED THORACOSCOPY (VATS)/THOROCOTOMY Left 07/01/2014   Procedure: with repair left diaphragmatic hernia ;  Surgeon: Mark Slot, MD;  Location: [facility] OR;  Service: Thoracic;  Laterality: Left;  Marland Kitchen VIDEO BRONCHOSCOPY N/A 07/01/2014   Procedure: VIDEO BRONCHOSCOPY;  Surgeon: Mark Slot, MD;  Location: [facility] OR;  Service: Thoracic;  Laterality: N/A;    Current Outpatient Medications  Medication Sig Dispense Refill  . acetaminophen (TYLENOL) 500 MG tablet Take 1,000  mg by mouth every 12 (twelve) hours as needed.    Marland Kitchen albuterol (PROVENTIL HFA;VENTOLIN HFA) 108 (90 BASE) MCG/ACT inhaler Inhale 2 puffs into the lungs every 6 (six) hours as needed for wheezing or shortness of breath.    Marland Kitchen albuterol (PROVENTIL) (2.5 MG/3ML) 0.083% nebulizer solution Take 2.5 mg by nebulization every 4 (four) hours as needed for wheezing.    Marland Kitchen aspirin 325 MG EC tablet Take 325 mg by mouth at bedtime.     . carvedilol (COREG) 12.5 MG tablet Take 12.5 mg by mouth 2 (two) times daily with a meal.     . ipratropium (ATROVENT) 0.02 % nebulizer solution Take 500 mcg  by nebulization every 4 (four) hours as needed for wheezing.    Marland Kitchen lisinopril (PRINIVIL,ZESTRIL) 10 MG tablet Take 10 mg by mouth daily.    . metFORMIN (GLUCOPHAGE) 500 MG tablet Take 500 mg by mouth 2 (two) times daily with a meal. Patient takes 1000 mg in the morning and 500 mg at supper    . simvastatin (ZOCOR) 40 MG tablet Take 40 mg by mouth every evening.     No current facility-administered medications for this visit.   Allergies:  Patient has no known allergies.   Social History: The patient  reports that he quit smoking about 2 years ago. His smoking use included cigarettes. He has a 24.00 pack-year smoking history. He has never used smokeless tobacco. He reports that he does not drink alcohol and does not use drugs.   Family History: The patient's family history includes Coronary artery disease in his father.   ROS: No syncope.  Physical Exam: VS:  BP (!) 142/78   Pulse 91   Ht 5\' 8"  (1.727 m)   Wt 166 lb 6.4 oz (75.5 kg)   SpO2 96%   BMI 25.30 kg/m , BMI Body mass index is 25.3 kg/m.  Wt Readings from Last 3 Encounters:  07/20/20 166 lb 6.4 oz (75.5 kg)  11/05/19 151 lb (68.5 kg)  06/26/18 155 lb (70.3 kg)    General: Patient appears comfortable at rest. HEENT: Conjunctiva and lids normal, wearing a mask. Neck: Supple, no elevated JVP or carotid bruits, no thyromegaly. Lungs: Clear to auscultation, nonlabored breathing at rest. Cardiac: Regular rate and rhythm, no S3 or significant systolic murmur, no pericardial rub. Abdomen: Soft, nontender, bowel sounds present. Extremities: No pitting edema, small venous varicosities, decreased distal pulses. Skin: Warm and dry. Musculoskeletal: No kyphosis. Neuropsychiatric: Alert and oriented x3, affect grossly appropriate.  ECG:  An ECG dated 06/26/2018 was personally reviewed today and demonstrated:  Possible sinus tachycardia with right bundle branch block and left anterior fascicular block.  Recent Labwork:  July 2021:  Cholesterol 142, HDL 34, triglycerides 225, LDL 77, hemoglobin A1c 5.7%  Other Studies Reviewed Today:  Echocardiogram 06/30/2014: - Left ventricle: The cavity size was normal. Wall thickness was  normal. Systolic function was normal. The estimated ejection  fraction was in the range of 55% to 60%. Possible mild  hypokinesis of the mid-apicalinferolateral myocardium. Possible  mild hypokinesis of the basalanteroseptal myocardium. (patent  bypass to distal LAD with occluded proximal LAD/septal branches?)  Doppler parameters are consistent with abnormal left ventricular  relaxation (grade 1 diastolic dysfunction).  Assessment and Plan:  1.  Nocturnal palpitations as discussed above, no associated syncope. Not aware of any symptoms with activity during the daytime.  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  He is on Coreg at baseline.  Plan to evaluate with a 7-day ZIO XT to evaluate for potential arrhythmia.  2.  Multivessel CAD status post CABG in 2010.  No definitive angina symptoms at this time.  We will obtain an echocardiogram to follow-up on LVEF, this was previously normal at 55 to 60% as of 2015.  Wall motion abnormalities noted at that time consistent with ischemic heart disease.  He continues on aspirin, Coreg, lisinopril, and Zocor.  3.  Mixed hyperlipidemia, on Zocor.  Last LDL 77.  Medication Adjustments/Labs and Tests Ordered: Current medicines are reviewed at length with the patient today.  Concerns regarding medicines are outlined above.   Tests Ordered: Orders Placed This Encounter  Procedures  . LONG TERM MONITOR (3-14 DAYS)  . ECHOCARDIOGRAM COMPLETE    Medication Changes: No orders of the defined types were placed in this encounter.   Disposition:  Follow up test results and determine next step.  Signed, Jonelle Sidle, MD, [facility] 07/20/2020 2:28 PM    [facility] at [facility] [address], [place], [place] [address] Phone: [phone]; Fax: [phone]

## 2020-07-28 ENCOUNTER — Other Ambulatory Visit: Payer: Self-pay

## 2020-07-28 ENCOUNTER — Ambulatory Visit (HOSPITAL_COMMUNITY)
Admission: RE | Admit: 2020-07-28 | Discharge: 2020-07-28 | Disposition: A | Discharge status: Home / Self Care | Payer: Medicare HMO | Source: Ambulatory Visit | Attending: Cardiology | Admitting: Cardiology

## 2020-07-28 DIAGNOSIS — I25119 Atherosclerotic heart disease of native coronary artery with unspecified angina pectoris: Secondary | ICD-10-CM | POA: Insufficient documentation

## 2020-07-28 LAB — ECHOCARDIOGRAM COMPLETE
AR max vel: 1.84 cm2
AV Area VTI: 1.68 cm2
AV Area mean vel: 1.62 cm2
AV Mean grad: 3.5 mmHg
AV Peak grad: 6.6 mmHg
Ao pk vel: 1.29 m/s
Area-P 1/2: 3.42 cm2
Calc EF: 37.7 %
MV M vel: 5.37 m/s
MV Peak grad: 115.3 mmHg
Radius: 0.5 cm
S' Lateral: 5.2 cm
Single Plane A2C EF: 35 %
Single Plane A4C EF: 41 %

## 2020-07-28 NOTE — Progress Notes (Signed)
*  PRELIMINARY RESULTS* Echocardiogram 2D Echocardiogram has been performed.  Stacey Drain 07/28/2020, 11:12 AM

## 2020-07-31 ENCOUNTER — Ambulatory Visit: Payer: Medicare HMO | Admitting: Cardiology

## 2020-08-08 ENCOUNTER — Other Ambulatory Visit: Payer: Self-pay

## 2020-08-08 ENCOUNTER — Telehealth: Payer: Self-pay

## 2020-08-08 DIAGNOSIS — Z79899 Other long term (current) drug therapy: Secondary | ICD-10-CM

## 2020-08-08 NOTE — Telephone Encounter (Signed)
-----   Message from Jonelle Sidle, MD sent at 08/08/2020  9:52 AM EST ----- Results reviewed.  Overall heart rhythm is normal.  He does have brief episodes of NSVT and PSVT, he could be feeling these as palpitations.  No sustained arrhythmias however.  In light of his recently documented cardiomyopathy, would suggest further medication changes.  Increase Coreg to 18.75 mg twice daily.  Switch from lisinopril to Entresto 24/26 mg twice daily after 36-hour washout.  He will need a BMET 7-10 days after switch to [place].  He should have a follow-up visit scheduled as well for further discussion.  Will also need to consider ischemic work-up with history of CABG.

## 2020-08-08 NOTE — Telephone Encounter (Signed)
Left message to return call 

## 2020-08-10 MED ORDER — CARVEDILOL 12.5 MG PO TABS: 18.7500 mg | ORAL_TABLET | Freq: Two times a day (BID) | ORAL | 3 refills | Status: DC

## 2020-08-10 MED ORDER — ENTRESTO 24-26 MG PO TABS: 1.0000 | ORAL_TABLET | Freq: Two times a day (BID) | ORAL | 6 refills | Status: DC

## 2020-08-10 NOTE — Telephone Encounter (Signed)
I spoke with patient. He agrees to stop lisinopril after today(12/23) . He will start Entresto 24/26 mg twice a day on 08/12/20    He will also increase Coreg to 18.75 mg twice a day    He will get BMET in 10 days (08/22/20) and will be seen on Monday, 12/27 at 11 am with A.Quinn,NP in the [place] office.   Patient states he knows where the [place] office is located.

## 2020-08-13 NOTE — Progress Notes (Signed)
Cardiology Office Note  Date: 08/14/2020   ID: Mark Rios, DOB 1951-05-19, MRN [id number]  PCP:  Gareth Morgan, MD  Cardiologist:  Nona Dell, MD Electrophysiologist:  None   Chief Complaint: follow up  History of Present Illness: Mark Rios is a 69 y.o. male with a history of palpitations, CAD (CABG 2010), Carotid artery disease, CVA, HLD, Chronic bilateral leg pain and venous varicosites (sees VVS).   Recently saw Dr Diona Browner on 07/20/2020 with c/o of fluttering sensation in chest for prior weeks to months mostly during the nighttime when attempting to lie down. No recent anginal symptoms. & day Zio monitor was ordered as well as echocardiogram to assess LV function, He was continuing ASA, Coreg, Lisinopril, Zocor. Last LDL was 77. He was continuing Zocor.   Increase Coreg to 18.75 mg twice daily. Switch from lisinopril to Entresto 24/26 mg twice daily after 36-hour washout. He will need a BMET 7-10 days after switch to [place]. He should have a follow-up visit scheduled as well for further discussion. Will also need to consider ischemic work-up with history of CABG.  Patient is here for follow-up status post recent ZIO monitor and echocardiogram results.  He was recently ordered to increase Coreg to 18.75 mg p.o. twice daily.  He was ordered to stop lisinopril and start Entresto 24/26 mg 36 hours after washout.  Apparently the patient misunderstood and was under the impression he was not to get the [facility] until following up today.  He has not started the [facility] yet due to this misunderstanding.  He has a prescription in the system waiting for pickup.  He denies any significant anginal or exertional symptoms, palpitations or arrhythmias other than feeling some fluttering sensations in his chest at nighttime which are not bothersome, orthostatic symptoms, CVA or TIA-like symptoms.  Denies any PND, orthopnea.  No lower extremity edema.  We discussed the  echocardiogram results and the fact that his ejection fraction was low which may indicate he needs an ischemic work-up determined etiology of decreased EF given his history of CAD with three-vessel CABG in 2010.   Past Medical History:  Diagnosis Date  . Arthritis   . CAD (coronary artery disease)    Multivessel status post CABG 2010 (LIMA to LAD, SVG to OM1, SVG to PDA - Dr. Dorris Fetch)  . Carotid artery disease (HCC)   . Cervical disc disease   . COPD (chronic obstructive pulmonary disease) (HCC)   . Erectile dysfunction   . Essential hypertension   . History of kidney stones       . History of stroke 2010  . Hyperlipidemia   . Hypertension   . Pneumothorax   . Traumatic diaphragmatic hernia    Repaired 07/01/2014  . Type 2 diabetes mellitus (HCC)     Past Surgical History:  Procedure Laterality Date  . CAROTID ARTERY ANGIOPLASTY Left    2007  . CORONARY ARTERY BYPASS GRAFT     x3  . HEMORROIDECTOMY    . INGUINAL HERNIA REPAIR Right 08/31/2014   Procedure: HERNIA REPAIR INGUINAL ADULT;  Surgeon: Dalia Heading, MD;  Location: [facility];  Service: General;  Laterality: Right;  . INGUINAL HERNIA REPAIR Left 06/12/2016   Procedure: HERNIA REPAIR INGUINAL ADULT WITH MESH;  Surgeon: Franky Macho, MD;  Location: [facility];  Service: General;  Laterality: Left;  . INSERTION OF MESH Right 08/31/2014   Procedure: INSERTION OF MESH;  Surgeon: Dalia Heading, MD;  Location: [facility];  Service:  General;  Laterality: Right;  inguinal hernia  . UMBILICAL HERNIA REPAIR N/A 08/31/2014   Procedure: HERNIA REPAIR UMBILICAL ADULT ;  Surgeon: Dalia Heading, MD;  Location: [facility];  Service: General;  Laterality: N/A;  . VIDEO ASSISTED THORACOSCOPY (VATS)/THOROCOTOMY Left 07/01/2014   Procedure: with repair left diaphragmatic hernia ;  Surgeon: Loreli Slot, MD;  Location: [facility] OR;  Service: Thoracic;  Laterality: Left;  Marland Kitchen VIDEO BRONCHOSCOPY N/A 07/01/2014   Procedure: VIDEO BRONCHOSCOPY;   Surgeon: Loreli Slot, MD;  Location: [facility] OR;  Service: Thoracic;  Laterality: N/A;    Current Outpatient Medications  Medication Sig Dispense Refill  . acetaminophen (TYLENOL) 500 MG tablet Take 1,000 mg by mouth every 12 (twelve) hours as needed.    Marland Kitchen albuterol (PROVENTIL HFA;VENTOLIN HFA) 108 (90 BASE) MCG/ACT inhaler Inhale 2 puffs into the lungs every 6 (six) hours as needed for wheezing or shortness of breath.    Marland Kitchen albuterol (PROVENTIL) (2.5 MG/3ML) 0.083% nebulizer solution Take 2.5 mg by nebulization every 4 (four) hours as needed for wheezing.    Marland Kitchen aspirin 325 MG EC tablet Take 325 mg by mouth at bedtime.     . carvedilol (COREG) 12.5 MG tablet Take 1.5 tablets (18.75 mg total) by mouth 2 (two) times daily. 270 tablet 3  . ipratropium (ATROVENT) 0.02 % nebulizer solution Take 500 mcg by nebulization every 4 (four) hours as needed for wheezing.    . metFORMIN (GLUCOPHAGE) 500 MG tablet Take 500 mg by mouth 2 (two) times daily with a meal. Patient takes 1000 mg in the morning and 500 mg at supper    . sacubitril-valsartan (ENTRESTO) 24-26 MG Take 1 tablet by mouth 2 (two) times daily. 60 tablet 6  . simvastatin (ZOCOR) 40 MG tablet Take 40 mg by mouth every evening.     No current facility-administered medications for this visit.   Allergies:  Patient has no known allergies.   Social History: The patient  reports that he quit smoking about 2 years ago. His smoking use included cigarettes. He has a 24.00 pack-year smoking history. He has never used smokeless tobacco. He reports that he does not drink alcohol and does not use drugs.   Family History: The patient's family history includes Coronary artery disease in his father.   ROS:  Please see the history of present illness. Otherwise, complete review of systems is positive for none.  All other systems are reviewed and negative.   Physical Exam: VS:  BP 120/76   Pulse 76   Ht 5\' 8"  (1.727 m)   Wt 168 lb (76.2 kg)   SpO2 98%    BMI 25.54 kg/m , BMI Body mass index is 25.54 kg/m.  Wt Readings from Last 3 Encounters:  08/14/20 168 lb (76.2 kg)  07/20/20 166 lb 6.4 oz (75.5 kg)  11/05/19 151 lb (68.5 kg)    General: Patient appears comfortable at rest. Neck: Supple, no elevated JVP or carotid bruits, no thyromegaly. Lungs: Clear to auscultation, nonlabored breathing at rest. Cardiac: Regular rate and rhythm, no S3 or significant systolic murmur, no pericardial rub. Extremities: No pitting edema, distal pulses 2+. Skin: Warm and dry. Musculoskeletal: No kyphosis. Neuropsychiatric: Alert and oriented x3, affect grossly appropriate.  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     Component Value  Date/Time   CHOL (H) 12/02/2008 1017    205        ATP III CLASSIFICATION:  <200     mg/dL   Desirable  [id number]  mg/dL   Borderline High  >=045>=240    mg/dL   High          TRIG 409241 (H) 12/02/2008 1017   HDL 27 (L) 12/02/2008 1017   CHOLHDL 7.6 12/02/2008 1017   VLDL 48 (H) 12/02/2008 1017   LDLCALC (H) 12/02/2008 1017    130        Total Cholesterol/HDL:CHD Risk Coronary Heart Disease Risk Table                     Men   Women  1/2 Average Risk   3.4   3.3  Average Risk       5.0   4.4  2 X Average Risk   9.6   7.1  3 X Average Risk  23.4   11.0        Use the calculated Patient Ratio above and the CHD Risk Table to determine the patient's CHD Risk.        ATP III CLASSIFICATION (LDL):  <100     mg/dL   Optimal  [id number]  mg/dL   Near or Above                    Optimal  130-159  mg/dL   Borderline  [id number]  mg/dL   High  >213>190     mg/dL   Very High    Other Studies Reviewed Today:  Cardiac Monitor 08/08/2020 ZIO XT reviewed.  6 days 15 hours analyzed.  Predominant rhythm is sinus with heart rate ranging from 53 bpm up to  122 bpm and average heart rate 81 bpm.  Rare PACs including couplets and triplets were noted representing less than 1% total beats.  Occasional PVCs were noted representing 1.4% total beats.  There were rare ventricular couplets and triplets as well.  Brief episodes of NSVT were noted, the longest of which was 6 beats.  There were also brief episodes of PSVT the longest of which lasted for 15 seconds.  No sustained arrhythmias or pauses   Echocardiogram 07/28/2020  1. Left ventricular ejection fraction, by estimation, is 20 to 25%. The left ventricle has severely decreased function. The left ventricle demonstrates global hypokinesis. The left ventricular internal cavity size was moderately dilated. Left ventricular diastolic parameters are consistent with Grade I diastolic dysfunction (impaired relaxation). Elevated left atrial pressure. 2. Right ventricular systolic function is normal. The right ventricular size is normal. 3. Left atrial size was mildly dilated. 4. Right atrial size was mildly dilated. 5. The mitral valve is normal in structure. Mild to moderate mitral valve regurgitation. No evidence of mitral stenosis. 6. The aortic valve has an indeterminant number of cusps. Aortic valve regurgitation is not visualized. No aortic stenosis is present. 7. The inferior vena cava is normal in size with greater than 50% respiratory variability, suggesting right atrial pressure of 3 mmHg.    Echocardiogram 06/30/2014: - Left ventricle: The cavity size was normal. Wall thickness was  normal. Systolic function was normal. The estimated ejection  fraction was in the range of 55% to 60%. Possible mild  hypokinesis of the mid-apicalinferolateral myocardium. Possible  mild hypokinesis of the basalanteroseptal myocardium. (patent  bypass to distal LAD with occluded proximal LAD/septal branches?)  Doppler parameters are consistent with abnormal  left ventricular  relaxation (grade 1  diastolic dysfunction).     Assessment and Plan:  1. Cardiomyopathy, unspecified type (HCC)   2. Palpitations   3. CAD in native artery   4. Mixed hyperlipidemia    1. 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  G1 DD, elevated left atrial pressure LA and RA mildly dilated, mild to moderate MR .  Dr. Diona Browner had previously ordered increase in carvedilol to 18.75 mg p.o. twice daily.  He was ordered to stop his lisinopril which he has done.  Apparently he was unaware he had a prescription for [facility] waiting for him to start after stopping the lisinopril.  He states today he has not started the [facility] and was under the impression we were planning to start the [place] today.  He has a prescription waiting for him at the pharmacy.  Advised him to start [facility] and we will get a follow-up basic metabolic panel and magnesium in 7 to 10 days.  Continue carvedilol 18.75 mg p.o. twice daily.  Start Entresto 24/26 mg p.o. twice daily starting today.  Get a basic metabolic panel and magnesium in 7 to 10 days.  Get a Lexiscan stress test to assess for ischemic etiology for decreased EF.  2. Palpitations Recent ZIO monitor showed predominantly sinus rhythm with a heart rate ranging from 53-122 with average heart rate 81 bpm.  Rare PVCs including couplets and triplets were noted representing less than 1% of total beats.  Occasional PVCs were noted representing 1.4% of beats.  Rare ventricular couplets and triplets as well.  Brief episodes of NSVT.  The longest of which was 6 beats.  Also brief episodes of PSVT longest lasting 15 seconds.  No sustained arrhythmias or pauses.  Continue carvedilol 18.75 mg p.o. twice daily.  3. CAD in native artery Denies any anginal or exertional symptoms.  Continue aspirin 325 mg p.o. daily.  Continue carvedilol 18.75 mg  p.o. twice daily.  4. Mixed hyperlipidemia Continue simvastatin 40 mg p.o. daily.     Medication Adjustments/Labs and Tests Ordered: Current medicines are reviewed at length with the patient today.  Concerns regarding medicines are outlined above.   Disposition: Follow-up with Dr. Diona Browner or APP 2 weeks  Signed, Rennis Harding, NP 08/14/2020 12:10 PM    [facility] at [facility] [address] [place], [place], [place] [address] Phone: [phone]; Fax: [phone]

## 2020-08-14 ENCOUNTER — Encounter: Payer: Self-pay | Admitting: Family Medicine

## 2020-08-14 ENCOUNTER — Ambulatory Visit (INDEPENDENT_AMBULATORY_CARE_PROVIDER_SITE_OTHER): Payer: Medicare HMO | Admitting: Family Medicine

## 2020-08-14 ENCOUNTER — Other Ambulatory Visit: Payer: Self-pay

## 2020-08-14 VITALS — BP 120/76 | HR 76 | Ht 68.0 in | Wt 168.0 lb

## 2020-08-14 DIAGNOSIS — R002 Palpitations: Secondary | ICD-10-CM | POA: Diagnosis not present

## 2020-08-14 DIAGNOSIS — I2510 Atherosclerotic heart disease of native coronary artery without angina pectoris: Secondary | ICD-10-CM

## 2020-08-14 DIAGNOSIS — E782 Mixed hyperlipidemia: Secondary | ICD-10-CM

## 2020-08-14 DIAGNOSIS — I429 Cardiomyopathy, unspecified: Secondary | ICD-10-CM

## 2020-08-14 NOTE — Patient Instructions (Addendum)
  Medication Instructions:  Your physician recommends that you continue on your current medications as directed. Please refer to the Current Medication list given to you today.  MAKE SURE YOU PICK UP YOUR PRESCRIPTION FOR ENTRESTO AND START IT.  *If you need a refill on your cardiac medications before your next appointment, please call your pharmacy*   Lab Work: BMET, MAGNESIUM IN 10 DAYS  If you have labs (blood work) drawn today and your tests are completely normal, you will receive your results only by: Marland Kitchen MyChart Message (if you have MyChart) OR . A paper copy in the mail If you have any lab test that is abnormal or we need to change your treatment, we will call you to review the results.   Testing/Procedures: Your physician has requested that you have a lexiscan myoview. For further information please visit [email or web address] Please follow instruction sheet, as given.  Follow-Up: At [facility], you and your health needs are our priority.  As part of our continuing mission to provide you with exceptional heart care, we have created designated Provider Care Teams.  These Care Teams include your primary Cardiologist (physician) and Advanced Practice Providers (APPs -  Physician Assistants and Nurse Practitioners) who all work together to provide you with the care you need, when you need it.  We recommend signing up for the patient portal called "MyChart".  Sign up information is provided on this After Visit Summary.  MyChart is used to connect with patients for Virtual Visits (Telemedicine).  Patients are able to view lab/test results, encounter notes, upcoming appointments, etc.  Non-urgent messages can be sent to your provider as well.   To learn more about what you can do with MyChart, go to [email or web address].    Your next appointment:   After Stress Test  The format for your next appointment:   In Person  Provider:   You may see Nona Dell, MD or the following  Advanced Practice Provider on your designated Care Team:    Nena Polio, NP

## 2020-08-15 ENCOUNTER — Telehealth: Payer: Self-pay | Admitting: Family Medicine

## 2020-08-15 NOTE — Telephone Encounter (Signed)
Pre-cert Verification for the following procedure    LEXISCAN   DATE:  08/21/2020  LOCATION:  [facility] [address] [facility].

## 2020-08-16 ENCOUNTER — Telehealth (HOSPITAL_COMMUNITY): Payer: Self-pay | Admitting: *Deleted

## 2020-08-16 NOTE — Telephone Encounter (Signed)
Left message on voicemail in reference to upcoming appointment scheduled for 08/21/20. Phone number given for a call back so details instructions can be given.

## 2020-08-17 ENCOUNTER — Telehealth (HOSPITAL_COMMUNITY): Payer: Self-pay

## 2020-08-17 ENCOUNTER — Telehealth (HOSPITAL_COMMUNITY): Payer: Self-pay | Admitting: *Deleted

## 2020-08-17 NOTE — Telephone Encounter (Signed)
Spoke with Doyne Keel in nuclear medicine department who states she will call the patient to review his instructions.

## 2020-08-17 NOTE — Telephone Encounter (Signed)
Left message on voicemail in reference to upcoming appointment scheduled for 08/21/20. Phone number given for a call back so details instructions can be given. Mark Rios, [name] No mychart available

## 2020-08-17 NOTE — Telephone Encounter (Signed)
Patient given detailed instructions per Myocardial Perfusion Study Information Sheet for the test on 08/21/20 at 0815. Patient notified to arrive 15 minutes early and that it is imperative to arrive on time for appointment to keep from having the test rescheduled.  If you need to cancel or reschedule your appointment, please call the office within 24 hours of your appointment. . Patient verbalized understanding. TMY

## 2020-08-17 NOTE — Telephone Encounter (Signed)
Patient is returning call to review instructions for stress test.

## 2020-08-21 ENCOUNTER — Encounter (HOSPITAL_COMMUNITY): Payer: Medicare HMO

## 2020-08-25 LAB — BASIC METABOLIC PANEL
BUN: 15 mg/dL (ref 7–25)
CO2: 29 mmol/L (ref 20–32)
Calcium: 9.1 mg/dL (ref 8.6–10.3)
Chloride: 104 mmol/L (ref 98–110)
Creat: 1.06 mg/dL (ref 0.70–1.25)
Glucose, Bld: 136 mg/dL (ref 65–139)
Potassium: 4 mmol/L (ref 3.5–5.3)
Sodium: 141 mmol/L (ref 135–146)

## 2020-08-30 ENCOUNTER — Ambulatory Visit (HOSPITAL_COMMUNITY): Payer: Medicare HMO | Attending: Internal Medicine

## 2020-08-30 ENCOUNTER — Other Ambulatory Visit: Payer: Self-pay

## 2020-08-30 DIAGNOSIS — I429 Cardiomyopathy, unspecified: Secondary | ICD-10-CM | POA: Diagnosis not present

## 2020-08-30 LAB — MYOCARDIAL PERFUSION IMAGING
LV dias vol: 242 mL (ref 62–150)
LV sys vol: 207 mL
Peak HR: 96 {beats}/min
Rest HR: 76 {beats}/min
SDS: 11
SRS: 4
SSS: 16
TID: 0.98

## 2020-08-30 MED ORDER — TECHNETIUM TC 99M TETROFOSMIN IV KIT
32.7000 | PACK | Freq: Once | INTRAVENOUS | Status: AC | PRN
  Administered 2020-08-30: 32.7 via INTRAVENOUS
  Filled 2020-08-30: qty 33

## 2020-08-30 MED ORDER — TECHNETIUM TC 99M TETROFOSMIN IV KIT
10.9000 | PACK | Freq: Once | INTRAVENOUS | Status: AC | PRN
  Administered 2020-08-30: 10.9 via INTRAVENOUS
  Filled 2020-08-30: qty 11

## 2020-08-30 MED ORDER — REGADENOSON 0.4 MG/5ML IV SOLN
0.4000 mg | Freq: Once | INTRAVENOUS | Status: AC
Start: 2020-08-30 — End: 2020-08-30
  Administered 2020-08-30: 0.4 mg via INTRAVENOUS

## 2020-09-04 ENCOUNTER — Telehealth: Payer: Self-pay | Admitting: *Deleted

## 2020-09-04 NOTE — Telephone Encounter (Signed)
Lesle Chris, LPN  0/05/2724 3:66 PM EST Back to Top     Notified, copy to pcp.

## 2020-09-04 NOTE — Telephone Encounter (Signed)
-----   Message from Eustace Coster, RN sent at 08/31/2020 10:58 AM EST -----  ----- Message ----- From: Netta Neat., NP Sent: 08/31/2020  10:54 AM EST To: Eustace Flippin, RN  Please let the patient know the stress test was abnormal but he has had a prior myocardial infarction.  However the EF was decreased which corresponds to the recent echocardiogram.  He has been started on Entresto.  There was no ischemia seen on perfusion images.  So in summary: No new ischemia.  Presence of previous damage from heart attack.  He is being treated for decreased EF.  Thank you

## 2020-09-20 NOTE — Progress Notes (Addendum)
Cardiology Office Note  Date: 09/21/2020   ID: Mark Rios, DOB April 23, 1951, MRN [id number]  PCP:  Gareth Morgan, MD  Cardiologist:  Nona Dell, MD Electrophysiologist:  None   Chief Complaint: follow up  History of Present Illness: Mark Rios is a 70 y.o. male with a history of palpitations, CAD (CABG 2010), Carotid artery disease, CVA, HLD, Chronic bilateral leg pain and venous varicosites (sees [facility]).   Recently saw Dr Diona Browner on 07/20/2020 with c/o of fluttering sensation in chest for prior weeks to months mostly during the nighttime when attempting to lie down. No recent anginal symptoms. & day Zio monitor was ordered as well as echocardiogram to assess LV function, He was continuing ASA, Coreg, Lisinopril, Zocor. Last LDL was 77. He was continuing Zocor.  Increased Coreg to 18.75 mg twice daily. Switched from lisinopril to Entresto 24/26 mg twice daily after 36-hour washout.  BMET 7-10 days after switch to [facility]. He should have a follow-up visit scheduled as well for further discussion. Will also need to consider ischemic work-up with history of CABG times 3 in 2010.  He is here today for follow-up status post recent abnormal stress test.  He denies any significant anginal or exertional symptoms, palpitations or arrhythmias other than feeling some fluttering sensations in his chest at nighttime which are not bothersome.  No orthostatic symptoms, CVA or TIA-like symptoms.  Denies any PND, orthopnea.  No lower extremity edema.  Discussed recent nuclear stress test results and the fact that it was abnormal.  He is taking all of his medications as directed.  He states he believes the Brooks may make him feel slightly dizzy at times but no presyncopal or syncopal episodes.  Advised him I would discuss the results with Dr. Diona Browner and decide if cardiac catheterization was warranted given his history and recent abnormal echo cardiogram and stress.  He states he is  compliant with all of his medications and taking them as directed.  Past Medical History:  Diagnosis Date  . Arthritis   . CAD (coronary artery disease)    Multivessel status post CABG 2010 (LIMA to LAD, SVG to OM1, SVG to PDA - Dr. Dorris Fetch)  . Carotid artery disease (HCC)   . Cervical disc disease   . COPD (chronic obstructive pulmonary disease) (HCC)   . Erectile dysfunction   . Essential hypertension   . History of kidney stones       . History of stroke 2010  . Hyperlipidemia   . Hypertension   . Pneumothorax   . Traumatic diaphragmatic hernia    Repaired 07/01/2014  . Type 2 diabetes mellitus (HCC)     Past Surgical History:  Procedure Laterality Date  . CAROTID ARTERY ANGIOPLASTY Left    2007  . CORONARY ARTERY BYPASS GRAFT     x3  . HEMORROIDECTOMY    . INGUINAL HERNIA REPAIR Right 08/31/2014   Procedure: HERNIA REPAIR INGUINAL ADULT;  Surgeon: Dalia Heading, MD;  Location: [facility];  Service: General;  Laterality: Right;  . INGUINAL HERNIA REPAIR Left 06/12/2016   Procedure: HERNIA REPAIR INGUINAL ADULT WITH MESH;  Surgeon: Franky Macho, MD;  Location: [facility];  Service: General;  Laterality: Left;  . INSERTION OF MESH Right 08/31/2014   Procedure: INSERTION OF MESH;  Surgeon: Dalia Heading, MD;  Location: [facility];  Service: General;  Laterality: Right;  inguinal hernia  . UMBILICAL HERNIA REPAIR N/A 08/31/2014   Procedure: HERNIA REPAIR UMBILICAL ADULT ;  Surgeon: Dalia Heading, MD;  Location: [facility];  Service: General;  Laterality: N/A;  . VIDEO ASSISTED THORACOSCOPY (VATS)/THOROCOTOMY Left 07/01/2014   Procedure: with repair left diaphragmatic hernia ;  Surgeon: Loreli Slot, MD;  Location: [facility] OR;  Service: Thoracic;  Laterality: Left;  Marland Kitchen VIDEO BRONCHOSCOPY N/A 07/01/2014   Procedure: VIDEO BRONCHOSCOPY;  Surgeon: Loreli Slot, MD;  Location[facility] OR;  Service: Thoracic;  Laterality: N/A;    Current Outpatient Medications  Medication Sig Dispense  Refill  . acetaminophen (TYLENOL) 500 MG tablet Take 1,000 mg by mouth every 12 (twelve) hours as needed.    Marland Kitchen albuterol (PROVENTIL HFA;VENTOLIN HFA) 108 (90 BASE) MCG/ACT inhaler Inhale 2 puffs into the lungs every 6 (six) hours as needed for wheezing or shortness of breath.    Marland Kitchen albuterol (PROVENTIL) (2.5 MG/3ML) 0.083% nebulizer solution Take 2.5 mg by nebulization every 4 (four) hours as needed for wheezing.    Marland Kitchen aspirin 325 MG EC tablet Take 325 mg by mouth at bedtime.     . carvedilol (COREG) 12.5 MG tablet Take 1.5 tablets (18.75 mg total) by mouth 2 (two) times daily. 270 tablet 3  . ipratropium (ATROVENT) 0.02 % nebulizer solution Take 500 mcg by nebulization every 4 (four) hours as needed for wheezing.    . metFORMIN (GLUCOPHAGE) 500 MG tablet Take 500 mg by mouth 2 (two) times daily with a meal. Patient takes 1000 mg in the morning and 500 mg at supper    . sacubitril-valsartan (ENTRESTO) 24-26 MG Take 1 tablet by mouth 2 (two) times daily. 60 tablet 6  . simvastatin (ZOCOR) 40 MG tablet Take 40 mg by mouth every evening.     No current facility-administered medications for this visit.   Allergies:  Patient has no known allergies.   Social History: The patient  reports that he quit smoking about 2 years ago. His smoking use included cigarettes. He has a 24.00 pack-year smoking history. He has never used smokeless tobacco. He reports that he does not drink alcohol and does not use drugs.   Family History: The patient's family history includes Coronary artery disease in his father.   ROS:  Please see the history of present illness. Otherwise, complete review of systems is positive for none.  All other systems are reviewed and negative.   Physical Exam: VS:  BP 122/78   Pulse 82   Ht 5\' 8"  (1.727 m)   Wt 168 lb 12.8 oz (76.6 kg)   SpO2 93%   BMI 25.67 kg/m , BMI Body mass index is 25.67 kg/m.  Wt Readings from Last 3 Encounters:  09/21/20 168 lb 12.8 oz (76.6 kg)  08/30/20  168 lb (76.2 kg)  08/14/20 168 lb (76.2 kg)    General: Patient appears comfortable at rest. Neck: Supple, no elevated JVP or carotid bruits, no thyromegaly. Lungs: Clear to auscultation, nonlabored breathing at rest. Cardiac: Regular rate and rhythm, no S3 or significant systolic murmur, no pericardial rub. Extremities: No pitting edema, distal pulses 2+. Skin: Warm and dry. Musculoskeletal: No kyphosis. Neuropsychiatric: Alert and oriented x3, affect grossly appropriate.  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  Recent Labwork: 08/24/2020: BUN 15; Creat 1.06; Potassium 4.0; Sodium 141     Component Value Date/Time   CHOL (H) 12/02/2008 1017    205        ATP III CLASSIFICATION:  <  200     mg/dL   Desirable  275-170  mg/dL   Borderline High  >=017    mg/dL   High          TRIG 494 (H) 12/02/2008 1017   HDL 27 (L) 12/02/2008 1017   CHOLHDL 7.6 12/02/2008 1017   VLDL 48 (H) 12/02/2008 1017   LDLCALC (H) 12/02/2008 1017    130        Total Cholesterol/HDL:CHD Risk Coronary Heart Disease Risk Table                     Men   Women  1/2 Average Risk   3.4   3.3  Average Risk       5.0   4.4  2 X Average Risk   9.6   7.1  3 X Average Risk  23.4   11.0        Use the calculated Patient Ratio above and the CHD Risk Table to determine the patient's CHD Risk.        ATP III CLASSIFICATION (LDL):  <100     mg/dL   Optimal  496-759  mg/dL   Near or Above                    Optimal  130-159  mg/dL   Borderline  163-846  mg/dL   High  >659     mg/dL   Very High    Other Studies Reviewed Today:  Nuclear exercise treadmill stress 08/30/2020 Study Highlights   Nuclear stress EF: 15%.  The left ventricular ejection fraction is severely decreased (<30%).  There was no ST segment deviation noted during stress.  Findings consistent with prior myocardial infarction.  This  is a high risk study.   Large, fixed perfusion defect with severely reduced EF suggest infarction. No ischemia on perfusion images. Extracardiac tracer uptake in bowel may affect counts in inferior and inferolateral segments at rest and with stress.  Perfusion summary: There is a large fixed perfusion defect present in the basal inferior, basal inferolateral, basal anterolateral, mid inferior, mid inferolateral, apical inferior, apical lateral and apex location. Severe fixed perfusion defect in the apical cap, and basal inferolateral wall.  Moderate fixed perfusion defect in the inferoapical, mid inferior, mid inferolateral segments. Mild fixed perfusion defect in the lateral apex, basal inferior wall and basal anterolateral wall.    Cardiac Monitor 08/08/2020 ZIO XT reviewed.  6 days 15 hours analyzed.  Predominant rhythm is sinus with heart rate ranging from 53 bpm up to 122 bpm and average heart rate 81 bpm.  Rare PACs including couplets and triplets were noted representing less than 1% total beats.  Occasional PVCs were noted representing 1.4% total beats.  There were rare ventricular couplets and triplets as well.  Brief episodes of NSVT were noted, the longest of which was 6 beats.  There were also brief episodes of PSVT the longest of which lasted for 15 seconds.  No sustained arrhythmias or pauses   Echocardiogram 07/28/2020  1. Left ventricular ejection fraction, by estimation, is 20 to 25%. The left ventricle has severely decreased function. The left ventricle demonstrates global hypokinesis. The left ventricular internal cavity size was moderately dilated. Left ventricular diastolic parameters are consistent with Grade I diastolic dysfunction (impaired relaxation). Elevated left atrial pressure. 2. Right ventricular systolic function is normal. The right ventricular size is normal. 3. Left atrial size was mildly dilated. 4. Right atrial size was  mildly dilated. 5. The mitral valve  is normal in structure. Mild to moderate mitral valve regurgitation. No evidence of mitral stenosis. 6. The aortic valve has an indeterminant number of cusps. Aortic valve regurgitation is not visualized. No aortic stenosis is present. 7. The inferior vena cava is normal in size with greater than 50% respiratory variability, suggesting right atrial pressure of 3 mmHg.    Echocardiogram 06/30/2014: - Left ventricle: The cavity size was normal. Wall thickness was  normal. Systolic function was normal. The estimated ejection  fraction was in the range of 55% to 60%. Possible mild  hypokinesis of the mid-apicalinferolateral myocardium. Possible  mild hypokinesis of the basalanteroseptal myocardium. (patent  bypass to distal LAD with occluded proximal LAD/septal branches?)  Doppler parameters are consistent with abnormal left ventricular  relaxation (grade 1 diastolic dysfunction).     Assessment and Plan:   1. 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  G1 DD, elevated left atrial pressure LA and RA mildly dilated, mild to moderate MR . Decrease aspirin to 81 mg daily continue Coreg 18.75 mg p.o. twice daily.  Continue Entresto 24/26 mg p.o. twice daily.  Start Aldactone 12.5 mg daily.  Get basic metabolic panel 7 to 10 days after starting medication.  Follow-up with Dr. Diona Browner in 4 to 6 weeks.  Get follow-up echocardiogram prior to visit with Dr. Diona Browner.  2. Palpitations Recent ZIO monitor showed predominantly sinus rhythm with a heart rate ranging from 53-122 with average heart rate 81 bpm.  Rare PVCs including couplets and triplets were noted representing less than 1% of total beats.  Occasional PVCs were noted representing 1.4% of beats.  Rare ventricular couplets and triplets as well.  Brief episodes of NSVT.  The  longest of which was 6 beats.  Also brief episodes of PSVT longest lasting 15 seconds.  No sustained arrhythmias or pauses.  Continue carvedilol 18.75 mg p.o. twice daily.  3. CAD in native artery Denies any anginal or exertional symptoms.  We will have him reduce the ASA dose to 81 mg p.o. daily.  Continue carvedilol 18.75 mg p.o. twice daily.  Recent abnormal stress test demonstrating.  Recent nuclear exercise treadmill stress test showed EF of 15%.  LVEF severely decreased less than 30%.  No ST segment deviation noted during stress.  Findings consistent with prior myocardial infarction.  High risk study.  I  4. Mixed hyperlipidemia Continue simvastatin 40 mg p.o. daily.     Medication Adjustments/Labs and Tests Ordered: Current medicines are reviewed at length with the patient today.  Concerns regarding medicines are outlined above.   Disposition: Follow-up with Dr. Diona Browner or APP  Signed, Rennis Harding, NP 09/21/2020 9:43 AM    [facility] at [facility] [address] [place], [place], [place] [address] Phone: [phone]; Fax: [phone]

## 2020-09-21 ENCOUNTER — Telehealth: Payer: Self-pay | Admitting: Cardiology

## 2020-09-21 ENCOUNTER — Ambulatory Visit: Payer: Medicare HMO | Admitting: Family Medicine

## 2020-09-21 ENCOUNTER — Encounter: Payer: Self-pay | Admitting: Family Medicine

## 2020-09-21 VITALS — BP 122/78 | HR 82 | Ht 68.0 in | Wt 168.8 lb

## 2020-09-21 DIAGNOSIS — R002 Palpitations: Secondary | ICD-10-CM | POA: Diagnosis not present

## 2020-09-21 DIAGNOSIS — I2510 Atherosclerotic heart disease of native coronary artery without angina pectoris: Secondary | ICD-10-CM

## 2020-09-21 DIAGNOSIS — I429 Cardiomyopathy, unspecified: Secondary | ICD-10-CM

## 2020-09-21 NOTE — Telephone Encounter (Signed)
Patient moved to [facility] and he is requesting to switch providers from Dr. Diona Browner to Dr. Servando Salina.  Please advise.

## 2020-09-21 NOTE — Patient Instructions (Signed)
Medication Instructions:  Continue all current medications.  Labwork: none  Testing/Procedures: none  Follow-Up: 3 months   Any Other Special Instructions Will Be Listed Below (If Applicable).  If you need a refill on your cardiac medications before your next appointment, please call your pharmacy.  

## 2020-09-21 NOTE — Telephone Encounter (Signed)
I certainly have no objection.  That would likely be much more convenient for him.

## 2020-09-22 ENCOUNTER — Encounter: Payer: Self-pay | Admitting: *Deleted

## 2020-09-22 ENCOUNTER — Telehealth: Payer: Self-pay | Admitting: *Deleted

## 2020-09-22 DIAGNOSIS — R931 Abnormal findings on diagnostic imaging of heart and coronary circulation: Secondary | ICD-10-CM

## 2020-09-22 MED ORDER — ASPIRIN EC 81 MG PO TBEC: 81.0000 mg | DELAYED_RELEASE_TABLET | Freq: Every day | ORAL | Status: AC

## 2020-09-22 MED ORDER — SPIRONOLACTONE 25 MG PO TABS: 12.5000 mg | ORAL_TABLET | Freq: Every day | ORAL | 3 refills | Status: DC

## 2020-09-22 NOTE — Telephone Encounter (Signed)
Patient last seen by Nena Polio, NP on 09/21/2020 - - patient needs to do the following:   Begin Aldactone 12.5mg  daily.   Decrease Aspirin to 81mg  daily.  Echo - just prior to next office visit.  4-6 weeks follow up with Dr. 

## 2020-09-22 NOTE — Telephone Encounter (Signed)
Patient returned call stating that he has changed his mind.  He will stay with Dr. Diona Browner & continue to see provider in [place].

## 2020-09-22 NOTE — Telephone Encounter (Signed)
[facility] notified to schedule Echo.  Will notify when test & next OV has been scheduled via my chart.

## 2020-09-22 NOTE — Telephone Encounter (Signed)
Patient returned call back to [facility]

## 2020-10-04 NOTE — Telephone Encounter (Signed)
Echo has been scheduled for 10/26/2020 at the [facility]. Upcoming appointment with Dr. Diona Browner I s3/21/2022 in the [facility].

## 2020-10-05 ENCOUNTER — Encounter: Payer: Self-pay | Admitting: *Deleted

## 2020-10-13 ENCOUNTER — Other Ambulatory Visit: Payer: Self-pay | Admitting: Family Medicine

## 2020-10-13 ENCOUNTER — Other Ambulatory Visit (HOSPITAL_COMMUNITY): Payer: Self-pay | Admitting: Cardiovascular Disease

## 2020-10-13 ENCOUNTER — Other Ambulatory Visit (HOSPITAL_COMMUNITY): Payer: Self-pay | Admitting: Cardiology

## 2020-10-26 ENCOUNTER — Ambulatory Visit (INDEPENDENT_AMBULATORY_CARE_PROVIDER_SITE_OTHER): Payer: Medicare HMO

## 2020-10-26 DIAGNOSIS — R931 Abnormal findings on diagnostic imaging of heart and coronary circulation: Secondary | ICD-10-CM | POA: Diagnosis not present

## 2020-10-26 DIAGNOSIS — I2510 Atherosclerotic heart disease of native coronary artery without angina pectoris: Secondary | ICD-10-CM | POA: Diagnosis not present

## 2020-10-26 LAB — ECHOCARDIOGRAM COMPLETE
Calc EF: 38.4 %
S' Lateral: 5.35 cm
Single Plane A2C EF: 42.7 %
Single Plane A4C EF: 42.2 %

## 2020-10-27 ENCOUNTER — Telehealth: Payer: Self-pay | Admitting: *Deleted

## 2020-10-27 NOTE — Telephone Encounter (Signed)
Patient informed. Copy sent to PCP °

## 2020-10-27 NOTE — Telephone Encounter (Signed)
-----   Message from Netta Neat., NP sent at 10/27/2020  7:59 AM EST ----- Please call the patient and let him know the repeat echocardiogram showed his pumping function has increased slightly but remains low .  Has a mildly leaking valve on the left.  Let him know that Dr. Diona Browner will discuss this on their upcoming appointment as to next steps to take.  Thank you

## 2020-11-06 ENCOUNTER — Encounter: Payer: Self-pay | Admitting: Cardiology

## 2020-11-06 ENCOUNTER — Ambulatory Visit: Payer: Medicare HMO | Admitting: Cardiology

## 2020-11-06 ENCOUNTER — Other Ambulatory Visit: Payer: Self-pay | Admitting: *Deleted

## 2020-11-06 VITALS — BP 104/60 | HR 100 | Wt 163.8 lb

## 2020-11-06 DIAGNOSIS — I25119 Atherosclerotic heart disease of native coronary artery with unspecified angina pectoris: Secondary | ICD-10-CM | POA: Diagnosis not present

## 2020-11-06 DIAGNOSIS — I429 Cardiomyopathy, unspecified: Secondary | ICD-10-CM

## 2020-11-06 MED ORDER — DIGOXIN 125 MCG PO TABS: 0.1250 mg | ORAL_TABLET | Freq: Every day | ORAL | 3 refills | Status: DC

## 2020-11-06 NOTE — Patient Instructions (Addendum)
Medication Instructions:   Your physician has recommended you make the following change in your medication:   Start digoxin 0.125 mg by mouth daily  Continue other medications the same  Labwork:  Your physician recommends that you return for lab work in: 2 months to check your BMET.  Testing/Procedures:  none  Follow-Up:  Your physician recommends that you schedule a follow-up appointment in: 2 months at the [place] office.   Any Other Special Instructions Will Be Listed Below (If Applicable).  If you need a refill on your cardiac medications before your next appointment, please call your pharmacy.

## 2020-11-06 NOTE — Progress Notes (Signed)
Cardiology Office Note  Date: 11/06/2020   ID: Mark Rios, DOB 1950-12-17, MRN [id number]  PCP:  Gareth Morgan, MD  Cardiologist:  Nona Dell, MD Electrophysiologist:  None   Chief Complaint  Patient presents with  . Cardiac follow-up    History of Present Illness: Mark Rios is a 70 y.o. male last seen in February by Mr. Vincenza Hews NP.  He presents for a follow-up visit.  States that he does feel a little better.  Less short of breath.  He is able to walk his dog about 1/2 mile each morning.  No angina symptoms.  Still feels a sense of palpitations, mainly when he is still in the evenings.  No syncope.  His most recent follow-up echocardiogram in March indicated LVEF approximately 25 to 30% with global hypokinesis, normal RV contraction.  Presents a small improvement of her prior assessment in December 2021.  I went over his medications.  He is currently tolerating his present regimen, initially had some dizziness with Entresto and does run a low normal blood pressure.  We discussed addition of Lanoxin.  Would not uptitrate further therapies.  He is on no other diuretic other than low-dose Aldactone.  His weight has been overall stable, down about 5 pounds from last check.  He has moved to [place] with his wife, still prefers to follow in our [place] practice.  Past Medical History:  Diagnosis Date  . Arthritis   . CAD (coronary artery disease)    Multivessel status post CABG 2010 (LIMA to LAD, SVG to OM1, SVG to PDA - Dr. Dorris Fetch)  . Carotid artery disease (HCC)   . Cervical disc disease   . COPD (chronic obstructive pulmonary disease) (HCC)   . Erectile dysfunction   . Essential hypertension   . History of kidney stones       . History of stroke 2010  . Hyperlipidemia   . Hypertension   . Pneumothorax   . Traumatic diaphragmatic hernia    Repaired 07/01/2014  . Type 2 diabetes mellitus (HCC)     Past Surgical History:  Procedure Laterality Date   . CAROTID ARTERY ANGIOPLASTY Left    2007  . CORONARY ARTERY BYPASS GRAFT     x3  . HEMORROIDECTOMY    . INGUINAL HERNIA REPAIR Right 08/31/2014   Procedure: HERNIA REPAIR INGUINAL ADULT;  Surgeon: Dalia Heading, MD;  Location: [facility];  Service: General;  Laterality: Right;  . INGUINAL HERNIA REPAIR Left 06/12/2016   Procedure: HERNIA REPAIR INGUINAL ADULT WITH MESH;  Surgeon: Franky Macho, MD;  Location: [facility];  Service: General;  Laterality: Left;  . INSERTION OF MESH Right 08/31/2014   Procedure: INSERTION OF MESH;  Surgeon: Dalia Heading, MD;  Location: [facility];  Service: General;  Laterality: Right;  inguinal hernia  . UMBILICAL HERNIA REPAIR N/A 08/31/2014   Procedure: HERNIA REPAIR UMBILICAL ADULT ;  Surgeon: Dalia Heading, MD;  Location: [facility];  Service: General;  Laterality: N/A;  . VIDEO ASSISTED THORACOSCOPY (VATS)/THOROCOTOMY Left 07/01/2014   Procedure: with repair left diaphragmatic hernia ;  Surgeon: Loreli Slot, MD;  Location[facility];  Service: Thoracic;  Laterality: Left;  Marland Kitchen VIDEO BRONCHOSCOPY N/A 07/01/2014   Procedure: VIDEO BRONCHOSCOPY;  Surgeon: Loreli Slot, MD;  Location: [facility];  Service: Thoracic;  Laterality: N/A;    Current Outpatient Medications  Medication Sig Dispense Refill  . acetaminophen (TYLENOL) 500 MG tablet Take 1,000 mg by mouth every 12 (  twelve) hours as needed.    Marland Kitchen albuterol (PROVENTIL HFA;VENTOLIN HFA) 108 (90 BASE) MCG/ACT inhaler Inhale 2 puffs into the lungs every 6 (six) hours as needed for wheezing or shortness of breath.    Marland Kitchen albuterol (PROVENTIL) (2.5 MG/3ML) 0.083% nebulizer solution Take 2.5 mg by nebulization every 4 (four) hours as needed for wheezing.    Marland Kitchen aspirin EC 81 MG tablet Take 1 tablet (81 mg total) by mouth daily.    . carvedilol (COREG) 12.5 MG tablet Take 1.5 tablets (18.75 mg total) by mouth 2 (two) times daily. 270 tablet 3  . digoxin (LANOXIN) 0.125 MG tablet Take 1 tablet (0.125 mg total) by mouth  daily. 90 tablet 3  . ipratropium (ATROVENT) 0.02 % nebulizer solution Take 500 mcg by nebulization every 4 (four) hours as needed for wheezing.    . metFORMIN (GLUCOPHAGE) 500 MG tablet Take 500 mg by mouth 2 (two) times daily with a meal. Patient takes 1000 mg in the morning and 500 mg at supper    . sacubitril-valsartan (ENTRESTO) 24-26 MG Take 1 tablet by mouth 2 (two) times daily. 60 tablet 6  . simvastatin (ZOCOR) 40 MG tablet Take 40 mg by mouth every evening.    Marland Kitchen spironolactone (ALDACTONE) 25 MG tablet Take 0.5 tablets (12.5 mg total) by mouth daily. 45 tablet 3   No current facility-administered medications for this visit.   Allergies:  Patient has no known allergies.   ROS: No syncope, no orthopnea or PND.  Physical Exam: VS:  BP 104/60 (BP Location: Right Arm, Patient Position: Sitting, Cuff Size: Normal)   Pulse 100   Wt 163 lb 12.8 oz (74.3 kg)   SpO2 94%   BMI 24.91 kg/m , BMI Body mass index is 24.91 kg/m.  Wt Readings from Last 3 Encounters:  11/06/20 163 lb 12.8 oz (74.3 kg)  09/21/20 168 lb 12.8 oz (76.6 kg)  08/30/20 168 lb (76.2 kg)    General: Patient appears comfortable at rest. HEENT: Conjunctiva and lids normal, wearing a mask. Neck: Supple, no elevated JVP or carotid bruits, no thyromegaly. Lungs: Clear to auscultation, nonlabored breathing at rest. Cardiac: Regular rate and rhythm, no S3, soft apical systolic murmur, no pericardial rub. Abdomen: Soft, nontender, bowel sounds present. Extremities: No pitting edema.  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  Recent Labwork: 08/24/2020: BUN 15; Creat 1.06; Potassium 4.0; Sodium 141   Other Studies Reviewed Today:  Cardiac monitor 08/08/2020: ZIO XT reviewed.  6 days 15 hours analyzed.  Predominant rhythm is sinus with heart rate ranging from 53 bpm up to 122 bpm and average heart rate 81  bpm.  Rare PACs including couplets and triplets were noted representing less than 1% total beats.  Occasional PVCs were noted representing 1.4% total beats.  There were rare ventricular couplets and triplets as well.  Brief episodes of NSVT were noted, the longest of which was 6 beats.  There were also brief episodes of PSVT the longest of which lasted for 15 seconds.  No sustained arrhythmias or pauses.  Lexiscan Myoview 08/30/2020:  Nuclear stress EF: 15%.  The left ventricular ejection fraction is severely decreased (<30%).  There was no ST segment deviation noted during stress.  Findings consistent with prior myocardial infarction.  This is a high risk study.   Large, fixed perfusion defect with severely reduced EF suggest infarction. No ischemia on  perfusion images. Extracardiac tracer uptake in bowel may affect counts in inferior and inferolateral segments at rest and with stress.  Echocardiogram 10/26/2020: 1. Left ventricular ejection fraction, by estimation, is 25 to 30%. The  left ventricle has severely decreased function. The left ventricle  demonstrates global hypokinesis. The left ventricular internal cavity size  was mildly dilated. Left ventricular  diastolic parameters are indeterminate. Elevated left atrial pressure. The  average left ventricular global longitudinal strain is -8.0 %. The global  longitudinal strain is abnormal.  2. Right ventricular systolic function is normal. The right ventricular  size is normal.  3. Left atrial size was mildly dilated.  4. The mitral valve is normal in structure. Mild mitral valve  regurgitation. No evidence of mitral stenosis.  5. The aortic valve was not well visualized. Aortic valve regurgitation  is not visualized. No aortic stenosis is present.  6. The inferior vena cava is normal in size with greater than 50%  respiratory variability, suggesting right atrial pressure of 3 mmHg.   Assessment and Plan:  1.  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  Most recent echocardiogram shows LVEF 25 to 30% range with global hypokinesis and normal RV contraction.  He does feel somewhat better.  Plan to continue aspirin, Coreg, Entresto, Aldactone, and start Lanoxin 0.125 mg daily.  Check BMET for next visit in the [place] office.  Could consider addition of SGLT2 inhibitor next.  I have talked with him about a right and left heart catheterization if symptoms worsen.  Myoview from January does not show any substantial degree of ischemia to necessarily suspect revascularization would benefit his LVEF however.  We will see how he does.  Next consideration would be referral for ICD if LVEF does not continue to improve.  2.  Multivessel CAD status post CABG in 2010.  Most likely has developed graft disease since that time.  He does not describe obvious angina.  Myoview from January of this year showed evidence of scar without active ischemia.  Continue aspirin and statin.  Medication Adjustments/Labs and Tests Ordered: Current medicines are reviewed at length with the patient today.  Concerns regarding medicines are outlined above.   Tests Ordered: Orders Placed This Encounter  Procedures  . Basic metabolic panel    Medication Changes: Meds ordered this encounter  Medications  . digoxin (LANOXIN) 0.125 MG tablet    Sig: Take 1 tablet (0.125 mg total) by mouth daily.    Dispense:  90 tablet    Refill:  3    Disposition:  Follow up 2 months in the [place] office.  Signed, Jonelle Sidle, MD, [facility] 11/06/2020 2:20 PM    [facility] [address] [place], [place], [place] [address] Phone: [phone]; Fax: [phone]

## 2020-11-25 LAB — BASIC METABOLIC PANEL
BUN: 21 mg/dL (ref 7–25)
CO2: 29 mmol/L (ref 20–32)
Calcium: 10.5 mg/dL — ABNORMAL HIGH (ref 8.6–10.3)
Chloride: 104 mmol/L (ref 98–110)
Creat: 0.97 mg/dL (ref 0.70–1.25)
Glucose, Bld: 85 mg/dL (ref 65–99)
Potassium: 4.7 mmol/L (ref 3.5–5.3)
Sodium: 140 mmol/L (ref 135–146)

## 2020-11-28 ENCOUNTER — Encounter: Payer: Self-pay | Admitting: *Deleted

## 2020-11-28 ENCOUNTER — Telehealth: Payer: Self-pay | Admitting: *Deleted

## 2020-11-28 NOTE — Telephone Encounter (Signed)
Patient informed. Copy sent to PCP °

## 2020-11-28 NOTE — Telephone Encounter (Signed)
-----   Message from Jonelle Sidle, MD sent at 11/27/2020  6:03 PM EDT ----- Results reviewed. Renal function and potassium are normal.

## 2021-01-12 ENCOUNTER — Encounter: Payer: Self-pay | Admitting: Student

## 2021-01-12 ENCOUNTER — Other Ambulatory Visit: Payer: Self-pay

## 2021-01-12 ENCOUNTER — Ambulatory Visit: Payer: Medicare HMO | Admitting: Student

## 2021-01-12 VITALS — BP 110/54 | HR 78 | Ht 67.0 in | Wt 168.2 lb

## 2021-01-12 DIAGNOSIS — I2510 Atherosclerotic heart disease of native coronary artery without angina pectoris: Secondary | ICD-10-CM | POA: Diagnosis not present

## 2021-01-12 DIAGNOSIS — I5042 Chronic combined systolic (congestive) and diastolic (congestive) heart failure: Secondary | ICD-10-CM | POA: Diagnosis not present

## 2021-01-12 DIAGNOSIS — I83893 Varicose veins of bilateral lower extremities with other complications: Secondary | ICD-10-CM

## 2021-01-12 DIAGNOSIS — I340 Nonrheumatic mitral (valve) insufficiency: Secondary | ICD-10-CM

## 2021-01-12 DIAGNOSIS — R002 Palpitations: Secondary | ICD-10-CM | POA: Diagnosis not present

## 2021-01-12 DIAGNOSIS — I10 Essential (primary) hypertension: Secondary | ICD-10-CM

## 2021-01-12 MED ORDER — DAPAGLIFLOZIN PROPANEDIOL 10 MG PO TABS: 10.0000 mg | ORAL_TABLET | Freq: Every day | ORAL | 11 refills | Status: DC

## 2021-01-12 NOTE — Patient Instructions (Signed)
Medication Instructions:  Your physician has recommended you make the following change in your medication:  START Farxiga 10 mg tablets daily  *If you need a refill on your cardiac medications before your next appointment, please call your pharmacy*   Lab Work: None If you have labs (blood work) drawn today and your tests are completely normal, you will receive your results only by: Marland Kitchen MyChart Message (if you have MyChart) OR . A paper copy in the mail If you have any lab test that is abnormal or we need to change your treatment, we will call you to review the results.   Testing/Procedures: Your physician has requested that you have an echocardiogram. Echocardiography is a painless test that uses sound waves to create images of your heart. It provides your doctor with information about the size and shape of your heart and how well your heart's chambers and valves are working. This procedure takes approximately one hour. There are no restrictions for this procedure.     Follow-Up: At [facility], you and your health needs are our priority.  As part of our continuing mission to provide you with exceptional heart care, we have created designated Provider Care Teams.  These Care Teams include your primary Cardiologist (physician) and Advanced Practice Providers (APPs -  Physician Assistants and Nurse Practitioners) who all work together to provide you with the care you need, when you need it.  We recommend signing up for the patient portal called "MyChart".  Sign up information is provided on this After Visit Summary.  MyChart is used to connect with patients for Virtual Visits (Telemedicine).  Patients are able to view lab/test results, encounter notes, upcoming appointments, etc.  Non-urgent messages can be sent to your provider as well.   To learn more about what you can do with MyChart, go to [email or web address].      Other Instructions You may take an extra half tablet of Coreg  (Carvedilol) if you start experiencing palpitations.   Faxiga Sample: Lot #: [id number] Exp: 06/18/20

## 2021-01-12 NOTE — Progress Notes (Signed)
Cardiology Office Note    Date:  01/12/2021   ID:  Mark Rios, DOB 04/12/1951, MRN [id number]  PCP:  Gareth Morgan, MD  Cardiologist: Nona Dell, MD    Chief Complaint  Patient presents with  . Follow-up    2 month visit    History of Present Illness:    Mark Rios is a 70 y.o. male with past medical history of CAD (s/p CABG in 2010 with LIMA-LAD, SVG-OM1 and SVG-PDA, NST in 08/2020 showing scar with no ischemia), HFrEF (EF 20-25% in 07/2020, at 25-30% by echo in 10/2020), mitral regurgitation, HTN, HLD, Type 2 DM and COPD who presents to the office today for 32-month follow-up.  He was examined by Dr. Diona Browner in 10/2020 and reported some improvement in his symptoms over the past few months and was walking his dog approximately half mile each morning without angina. He did report occasional palpitations but denied any syncope. He was continued on ASA, Coreg, Entresto and Spironolactone with Digoxin 0.125 mg daily being added to his medication regimen. Was recommended to consider the addition of SGLT2 inhibitor at his next visit. It was reviewed to consider a R/LHC if symptoms progressed but was not pursued at that time given no ischemia by recent NST.  In talking with the patient today, he reports overall feeling well since his last office visit.  He continues to walk with his dog each day and denies any dyspnea on exertion or chest pain with this. He does experience intermittent pain along his lower extremities and has prominent varicose veins that he reports bleed occasionally. He denies any recent orthopnea, PND or pitting edema.  Does experience occasional dizziness during the day with positional changes.  Also reports intermittent palpitations which typically occur at night.  He does consume caffeinated beverages throughout the day.  Past Medical History:  Diagnosis Date  . Arthritis   . CAD (coronary artery disease)    Multivessel status post CABG 2010 (LIMA to  LAD, SVG to OM1, SVG to PDA - Dr. Dorris Fetch)  . Carotid artery disease (HCC)   . Cervical disc disease   . COPD (chronic obstructive pulmonary disease) (HCC)   . Erectile dysfunction   . Essential hypertension   . History of kidney stones       . History of stroke 2010  . Hyperlipidemia   . Hypertension   . Pneumothorax   . Traumatic diaphragmatic hernia    Repaired 07/01/2014  . Type 2 diabetes mellitus (HCC)     Past Surgical History:  Procedure Laterality Date  . CAROTID ARTERY ANGIOPLASTY Left    2007  . CORONARY ARTERY BYPASS GRAFT     x3  . HEMORROIDECTOMY    . INGUINAL HERNIA REPAIR Right 08/31/2014   Procedure: HERNIA REPAIR INGUINAL ADULT;  Surgeon: Dalia Heading, MD;  Location: [facility];  Service: General;  Laterality: Right;  . INGUINAL HERNIA REPAIR Left 06/12/2016   Procedure: HERNIA REPAIR INGUINAL ADULT WITH MESH;  Surgeon: Franky Macho, MD;  Location: [facility];  Service: General;  Laterality: Left;  . INSERTION OF MESH Right 08/31/2014   Procedure: INSERTION OF MESH;  Surgeon: Dalia Heading, MD;  Location: [facility];  Service: General;  Laterality: Right;  inguinal hernia  . UMBILICAL HERNIA REPAIR N/A 08/31/2014   Procedure: HERNIA REPAIR UMBILICAL ADULT ;  Surgeon: Dalia Heading, MD;  Location: [facility];  Service: General;  Laterality: N/A;  . VIDEO ASSISTED THORACOSCOPY (VATS)/THOROCOTOMY Left 07/01/2014  Procedure: with repair left diaphragmatic hernia ;  Surgeon: Loreli SlotSteven C Hendrickson, MD;  [facility];  Service: Thoracic;  Laterality: Left;  Marland Kitchen. VIDEO BRONCHOSCOPY N/A 07/01/2014   Procedure: VIDEO BRONCHOSCOPY;  Surgeon: Loreli SlotSteven C Hendrickson, MD;  [facility];  Service: Thoracic;  Laterality: N/A;    Current Medications: Outpatient Medications Prior to Visit  Medication Sig Dispense Refill  . acetaminophen (TYLENOL) 500 MG tablet Take 1,000 mg by mouth every 12 (twelve) hours as needed.    Marland Kitchen. albuterol (PROVENTIL HFA;VENTOLIN HFA) 108 (90 BASE) MCG/ACT  inhaler Inhale 2 puffs into the lungs every 6 (six) hours as needed for wheezing or shortness of breath.    Marland Kitchen. albuterol (PROVENTIL) (2.5 MG/3ML) 0.083% nebulizer solution Take 2.5 mg by nebulization every 4 (four) hours as needed for wheezing.    Marland Kitchen. aspirin EC 81 MG tablet Take 1 tablet (81 mg total) by mouth daily.    . carvedilol (COREG) 12.5 MG tablet Take 1.5 tablets (18.75 mg total) by mouth 2 (two) times daily. 270 tablet 3  . digoxin (LANOXIN) 0.125 MG tablet Take 1 tablet (0.125 mg total) by mouth daily. 90 tablet 3  . ipratropium (ATROVENT) 0.02 % nebulizer solution Take 500 mcg by nebulization every 4 (four) hours as needed for wheezing.    . metFORMIN (GLUCOPHAGE) 500 MG tablet Take 500 mg by mouth 2 (two) times daily with a meal. Patient takes 1000 mg in the morning and 500 mg at supper    . sacubitril-valsartan (ENTRESTO) 24-26 MG Take 1 tablet by mouth 2 (two) times daily. 60 tablet 6  . simvastatin (ZOCOR) 40 MG tablet Take 40 mg by mouth every evening.    Marland Kitchen. spironolactone (ALDACTONE) 25 MG tablet Take 0.5 tablets (12.5 mg total) by mouth daily. 45 tablet 3   No facility-administered medications prior to visit.     Allergies:   Patient has no known allergies.   Social History   Socioeconomic History  . Marital status: Married    Spouse name: Not on file  . Number of children: Not on file  . Years of education: Not on file  . Highest education level: Not on file  Occupational History  . Not on file  Tobacco Use  . Smoking status: Former Smoker    Packs/day: 0.50    Years: 48.00    Pack years: 24.00    Types: Cigarettes    Quit date: 06/23/2018    Years since quitting: 2.5  . Smokeless tobacco: Never Used  Vaping Use  . Vaping Use: Never used  Substance and Sexual Activity  . Alcohol use: No  . Drug use: No  . Sexual activity: Not on file  Other Topics Concern  . Not on file  Social History Narrative  . Not on file   Social Determinants of Health   Financial  Resource Strain: Not on file  Food Insecurity: Not on file  Transportation Needs: Not on file  Physical Activity: Not on file  Stress: Not on file  Social Connections: Not on file     Family History:  The patient's family history includes Coronary artery disease in his father.   Review of Systems:    Please see the history of present illness.     All other systems reviewed and are otherwise negative except as noted above.   Physical Exam:    VS:  BP (!) 110/54   Pulse 78   Ht 5\' 7"  (1.702 m)   Wt 168  lb 3.2 oz (76.3 kg)   SpO2 95%   BMI 26.34 kg/m    General: Well developed, well nourished,male appearing in no acute distress. Head: Normocephalic, atraumatic. Neck: No carotid bruits. JVD not elevated.  Lungs: Respirations regular and unlabored, without wheezes or rales.  Heart: Regular rate and rhythm. No S3 or S4.  No murmur, no rubs, or gallops appreciated. Abdomen: Appears non-distended. No obvious abdominal masses. Msk:  Strength and tone appear normal for age. No obvious joint deformities or effusions. Extremities: No clubbing or cyanosis. No lower extremity edema.  Distal pedal pulses are 2+ bilaterally. Prominent varicose veins.  Neuro: Alert and oriented X 3. Moves all extremities spontaneously. No focal deficits noted. Psych:  Responds to questions appropriately with a normal affect. Skin: No rashes or lesions noted  Wt Readings from Last 3 Encounters:  01/12/21 168 lb 3.2 oz (76.3 kg)  11/06/20 163 lb 12.8 oz (74.3 kg)  09/21/20 168 lb 12.8 oz (76.6 kg)     Studies/Labs Reviewed:   EKG:  EKG is not ordered today.    Recent Labs: 11/24/2020: BUN 21; Creat 0.97; Potassium 4.7; Sodium 140   Lipid Panel    Component Value Date/Time   CHOL (H) 12/02/2008 1017    205        ATP III CLASSIFICATION:  <200     mg/dL   Desirable  308-657  mg/dL   Borderline High  >=846    mg/dL   High          TRIG 962 (H) 12/02/2008 1017   HDL 27 (L) 12/02/2008 1017    CHOLHDL 7.6 12/02/2008 1017   VLDL 48 (H) 12/02/2008 1017   LDLCALC (H) 12/02/2008 1017    130        Total Cholesterol/HDL:CHD Risk Coronary Heart Disease Risk Table                     Men   Women  1/2 Average Risk   3.4   3.3  Average Risk       5.0   4.4  2 X Average Risk   9.6   7.1  3 X Average Risk  23.4   11.0        Use the calculated Patient Ratio above and the CHD Risk Table to determine the patient's CHD Risk.        ATP III CLASSIFICATION (LDL):  <100     mg/dL   Optimal  952-841  mg/dL   Near or Above                    Optimal  130-159  mg/dL   Borderline  324-401  mg/dL   High  >027     mg/dL   Very High    Additional studies/ records that were reviewed today include:   Event Monitor: 07/2020 ZIO XT reviewed.  6 days 15 hours analyzed.  Predominant rhythm is sinus with heart rate ranging from 53 bpm up to 122 bpm and average heart rate 81 bpm.  Rare PACs including couplets and triplets were noted representing less than 1% total beats.  Occasional PVCs were noted representing 1.4% total beats.  There were rare ventricular couplets and triplets as well.  Brief episodes of NSVT were noted, the longest of which was 6 beats.  There were also brief episodes of PSVT the longest of which lasted for 15 seconds.  No sustained arrhythmias or pauses.  NST: 08/2020  Nuclear stress EF: 15%.  The left ventricular ejection fraction is severely decreased (<30%).  There was no ST segment deviation noted during stress.  Findings consistent with prior myocardial infarction.  This is a high risk study.   Large, fixed perfusion defect with severely reduced EF suggest infarction. No ischemia on perfusion images. Extracardiac tracer uptake in bowel may affect counts in inferior and inferolateral segments at rest and with stress.  Perfusion summary: There is a large fixed perfusion defect present in the basal inferior, basal inferolateral, basal anterolateral, mid inferior, mid  inferolateral, apical inferior, apical lateral and apex location. Severe fixed perfusion defect in the apical cap, and basal inferolateral wall.  Moderate fixed perfusion defect in the inferoapical, mid inferior, mid inferolateral segments. Mild fixed perfusion defect in the lateral apex, basal inferior wall and basal anterolateral wall.   Echocardiogram: 10/2020 IMPRESSIONS    1. Left ventricular ejection fraction, by estimation, is 25 to 30%. The  left ventricle has severely decreased function. The left ventricle  demonstrates global hypokinesis. The left ventricular internal cavity size  was mildly dilated. Left ventricular  diastolic parameters are indeterminate. Elevated left atrial pressure. The  average left ventricular global longitudinal strain is -8.0 %. The global  longitudinal strain is abnormal.  2. Right ventricular systolic function is normal. The right ventricular  size is normal.  3. Left atrial size was mildly dilated.  4. The mitral valve is normal in structure. Mild mitral valve  regurgitation. No evidence of mitral stenosis.  5. The aortic valve was not well visualized. Aortic valve regurgitation  is not visualized. No aortic stenosis is present.  6. The inferior vena cava is normal in size with greater than 50%  respiratory variability, suggesting right atrial pressure of 3 mmHg.   Comparison(s): Prior Echo showed LV EF 20-25%, severely decreased  function, global hypokinesis, moderate LVE, Grade I diastolic dysfunction.  Mild to moderate MR.   Assessment:    1. Coronary artery disease involving native coronary artery of native heart without angina pectoris   2. Chronic combined systolic and diastolic heart failure (HCC)   3. Palpitations   4. Mitral valve insufficiency, unspecified etiology   5. Essential hypertension   6. Varicose veins of bilateral lower extremities with other complications      Plan:   In order of problems listed above:  1.  CAD - He is s/p CABG in 2010 with LIMA-LAD, SVG-OM1 and SVG-PDA and most recent ischemic evaluation was a NST in 08/2020 showing scar with no ischemia. - He remains active at baseline and denies any recent anginal symptoms. I encouraged him to make [place] aware if he does develop chest pain or dyspnea on exertion as a R/LHC could be arranged as previously reviewed. - Continue current medication regimen with ASA 81 mg daily, Coreg 18.75 mg twice daily and Simvastatin 40 mg daily.  2. HFrEF - His EF was at 20-25% in 07/2020, at 25-30% by echo in 10/2020.  He denies any orthopnea, PND or lower extremity edema.  Appears euvolemic by examination today. - Will continue Coreg 18.75 mg twice daily, Digoxin 0.125 mg daily, Entresto 24-26 mg twice daily and Spironolactone 12.5 mg daily. Would not further titrate Entresto or Spironolactone at this time given his intermittent dizziness during the day which seems most consistent with orthostasis. Will plan to add Farxiga 10 mg daily. He was provided with samples today and was encouraged to check to see if his co-pay for this  will be affordable. Will plan for a repeat echocardiogram in 3 months and follow-up afterwards.   3. Palpitations - He does experience intermittent palpitations which are typically most notable at night.  Recent monitor in 07/2020 showed occasional PAC's and PVC's with brief episodes of NSVT and PSVT.  He is on Coreg 18.75mg  BID and BP does not allow for further titration of this currently.  We reviewed that he could take an extra half tablet if needed for persistent symptoms. I also recommended that he reduce his caffeine consumption  4. Mitral Regurgitation - Mild by recent echocardiogram in 10/2020.  Will continue to follow.  5. HTN - BP is well controlled at 110/54 during today's visit.  Continue current medication regimen.  6. Leg Pain - I suspect his bilateral leg pain is secondary to his prominent varicose veins. I recommended that he  try utilizing compression stockings. Previously evaluated by Vascular Surgery.  If symptoms persist, would consider lower extremity ABI's to rule out PAD given his multiple risk factors   Medication Adjustments/Labs and Tests Ordered: Current medicines are reviewed at length with the patient today.  Concerns regarding medicines are outlined above.  Medication changes, Labs and Tests ordered today are listed in the Patient Instructions below. Patient Instructions  Medication Instructions:  Your physician has recommended you make the following change in your medication:  START Farxiga 10 mg tablets daily  *If you need a refill on your cardiac medications before your next appointment, please call your pharmacy*   Lab Work: None If you have labs (blood work) drawn today and your tests are completely normal, you will receive your results only by: Marland Kitchen MyChart Message (if you have MyChart) OR . A paper copy in the mail If you have any lab test that is abnormal or we need to change your treatment, we will call you to review the results.   Testing/Procedures: Your physician has requested that you have an echocardiogram. Echocardiography is a painless test that uses sound waves to create images of your heart. It provides your doctor with information about the size and shape of your heart and how well your heart's chambers and valves are working. This procedure takes approximately one hour. There are no restrictions for this procedure.     Follow-Up: At [facility], you and your health needs are our priority.  As part of our continuing mission to provide you with exceptional heart care, we have created designated Provider Care Teams.  These Care Teams include your primary Cardiologist (physician) and Advanced Practice Providers (APPs -  Physician Assistants and Nurse Practitioners) who all work together to provide you with the care you need, when you need it.  We recommend signing up for the  patient portal called "MyChart".  Sign up information is provided on this After Visit Summary.  MyChart is used to connect with patients for Virtual Visits (Telemedicine).  Patients are able to view lab/test results, encounter notes, upcoming appointments, etc.  Non-urgent messages can be sent to your provider as well.   To learn more about what you can do with MyChart, go to [email or web address].      Other Instructions You may take an extra half tablet of Coreg (Carvedilol) if you start experiencing palpitations.   Faxiga Sample: Lot #: [id number] Exp: 06/18/20     Signed, Ellsworth Lennox, PA-C  01/12/2021 5:19 PM    [facility] [address] [place], [place] [address] Phone: [phone] Fax: [phone]

## 2021-02-14 ENCOUNTER — Telehealth: Payer: Self-pay | Admitting: Cardiology

## 2021-02-14 ENCOUNTER — Other Ambulatory Visit: Payer: Self-pay | Admitting: Cardiology

## 2021-02-14 NOTE — Telephone Encounter (Signed)
Pt was questioning about a $100 discount card he had heard about. Explained to pt that we do not offer a discount card for that amount, only the free 30 day trial offer which he had received. Pt verbalized understanding. Explained to pt that Sherryll Burger does offer a PAF which he was interested in. We mailed the paperwork to pt to be filled out and brought back in.

## 2021-02-14 NOTE — Telephone Encounter (Signed)
Patient called in to verify that our office sent him a discount card for $100 to be able to have entresto. Please advise

## 2021-02-16 ENCOUNTER — Other Ambulatory Visit: Payer: Self-pay | Admitting: Cardiology

## 2021-04-13 ENCOUNTER — Ambulatory Visit (HOSPITAL_COMMUNITY)
Admission: RE | Admit: 2021-04-13 | Discharge: 2021-04-13 | Disposition: A | Discharge status: Home / Self Care | Payer: Medicare HMO | Source: Ambulatory Visit | Attending: Cardiology | Admitting: Cardiology

## 2021-04-13 ENCOUNTER — Other Ambulatory Visit: Payer: Self-pay

## 2021-04-13 DIAGNOSIS — I2510 Atherosclerotic heart disease of native coronary artery without angina pectoris: Secondary | ICD-10-CM | POA: Insufficient documentation

## 2021-04-13 DIAGNOSIS — I779 Disorder of arteries and arterioles, unspecified: Secondary | ICD-10-CM | POA: Insufficient documentation

## 2021-04-13 DIAGNOSIS — I5042 Chronic combined systolic (congestive) and diastolic (congestive) heart failure: Secondary | ICD-10-CM

## 2021-04-13 DIAGNOSIS — I509 Heart failure, unspecified: Secondary | ICD-10-CM | POA: Insufficient documentation

## 2021-04-13 DIAGNOSIS — I358 Other nonrheumatic aortic valve disorders: Secondary | ICD-10-CM | POA: Insufficient documentation

## 2021-04-13 DIAGNOSIS — J449 Chronic obstructive pulmonary disease, unspecified: Secondary | ICD-10-CM | POA: Diagnosis not present

## 2021-04-13 DIAGNOSIS — Z951 Presence of aortocoronary bypass graft: Secondary | ICD-10-CM | POA: Diagnosis not present

## 2021-04-13 DIAGNOSIS — E785 Hyperlipidemia, unspecified: Secondary | ICD-10-CM | POA: Diagnosis not present

## 2021-04-13 DIAGNOSIS — I110 Hypertensive heart disease with heart failure: Secondary | ICD-10-CM | POA: Diagnosis not present

## 2021-04-13 DIAGNOSIS — E119 Type 2 diabetes mellitus without complications: Secondary | ICD-10-CM | POA: Diagnosis not present

## 2021-04-13 LAB — ECHOCARDIOGRAM COMPLETE
AR max vel: 4.26 cm2
AV Area VTI: 4.15 cm2
AV Area mean vel: 4.23 cm2
AV Mean grad: 1.9 mmHg
AV Peak grad: 4.6 mmHg
Ao pk vel: 1.07 m/s
Area-P 1/2: 7.74 cm2
S' Lateral: 4.9 cm
Single Plane A4C EF: 34.6 %

## 2021-04-13 NOTE — Progress Notes (Signed)
  Echocardiogram 2D Echocardiogram has been performed.  Mark Rios 04/13/2021, 1:36 PM

## 2021-04-14 ENCOUNTER — Other Ambulatory Visit (HOSPITAL_COMMUNITY): Payer: Medicare HMO

## 2021-04-17 ENCOUNTER — Other Ambulatory Visit: Payer: Self-pay

## 2021-04-17 ENCOUNTER — Ambulatory Visit: Payer: Medicare HMO | Admitting: Cardiology

## 2021-04-17 ENCOUNTER — Encounter: Payer: Self-pay | Admitting: Cardiology

## 2021-04-17 VITALS — BP 102/62 | HR 90 | Ht 68.0 in | Wt 167.2 lb

## 2021-04-17 DIAGNOSIS — I255 Ischemic cardiomyopathy: Secondary | ICD-10-CM

## 2021-04-17 DIAGNOSIS — I10 Essential (primary) hypertension: Secondary | ICD-10-CM

## 2021-04-17 DIAGNOSIS — I25119 Atherosclerotic heart disease of native coronary artery with unspecified angina pectoris: Secondary | ICD-10-CM | POA: Diagnosis not present

## 2021-04-17 NOTE — Patient Instructions (Signed)
Medication Instructions:  Your physician recommends that you continue on your current medications as directed. Please refer to the Current Medication list given to you today.  *If you need a refill on your cardiac medications before your next appointment, please call your pharmacy*   Lab Work: IN THREE (3) MONTHS: BMET  If you have labs (blood work) drawn today and your tests are completely normal, you will receive your results only by: MyChart Message (if you have MyChart) OR A paper copy in the mail If you have any lab test that is abnormal or we need to change your treatment, we will call you to review the results.   Testing/Procedures: None   Follow-Up: At [facility], you and your health needs are our priority.  As part of our continuing mission to provide you with exceptional heart care, we have created designated Provider Care Teams.  These Care Teams include your primary Cardiologist (physician) and Advanced Practice Providers (APPs -  Physician Assistants and Nurse Practitioners) who all work together to provide you with the care you need, when you need it.  We recommend signing up for the patient portal called "MyChart".  Sign up information is provided on this After Visit Summary.  MyChart is used to connect with patients for Virtual Visits (Telemedicine).  Patients are able to view lab/test results, encounter notes, upcoming appointments, etc.  Non-urgent messages can be sent to your provider as well.   To learn more about what you can do with MyChart, go to [email or web address].    Your next appointment:   3 month(s)  The format for your next appointment:   In Person  Provider:   Nona Dell, MD   Other Instructions

## 2021-04-17 NOTE — Progress Notes (Signed)
Cardiology Office Note  Date: 04/17/2021   ID: Mark, Rios 04-22-1951, MRN [id number]  PCP:  Gareth Morgan, MD  Cardiologist:  Nona Dell, MD Electrophysiologist:  None   Chief Complaint  Patient presents with   Cardiac follow-up     History of Present Illness: Mark Rios is a 70 y.o. male last seen in May by Ms. Strader PA-C.  He is here for a follow-up visit.  Continues to do relatively well, states that he walks 30 minutes each day.  He does not report any angina and describes NYHA class I-II dyspnea.  No palpitations or syncope, no orthopnea or PND.  Recent follow-up echocardiogram revealed continued improvement in LVEF, now in the range of 40 to 45% and normal RV contraction.  We discussed the results today.  No longer in range to consider ICD.  I reviewed his current medications.  He tends to run a low to low normal blood pressure limiting up titration of therapy.  Past Medical History:  Diagnosis Date   Arthritis    CAD (coronary artery disease)    Multivessel status post CABG 2010 (LIMA to LAD, SVG to OM1, SVG to PDA - Dr. Dorris Fetch)   Carotid artery disease [facility])    Cervical disc disease    COPD (chronic obstructive pulmonary disease) (HCC)    Erectile dysfunction    Essential hypertension    History of kidney stones        History of stroke 2010   Hyperlipidemia    Hypertension    Pneumothorax    Traumatic diaphragmatic hernia    Repaired 07/01/2014   Type 2 diabetes mellitus (HCC)     Past Surgical History:  Procedure Laterality Date   CAROTID ARTERY ANGIOPLASTY Left    2007   CORONARY ARTERY BYPASS GRAFT     x3   HEMORROIDECTOMY     INGUINAL HERNIA REPAIR Right 08/31/2014   Procedure: HERNIA REPAIR INGUINAL ADULT;  Surgeon: Dalia Heading, MD;  Location: [facility];  Service: General;  Laterality: Right;   INGUINAL HERNIA REPAIR Left 06/12/2016   Procedure: HERNIA REPAIR INGUINAL ADULT WITH MESH;  Surgeon: Franky Macho, MD;   Location: [facility];  Service: General;  Laterality: Left;   INSERTION OF MESH Right 08/31/2014   Procedure: INSERTION OF MESH;  Surgeon: Dalia Heading, MD;  Location: [facility];  Service: General;  Laterality: Right;  inguinal hernia   UMBILICAL HERNIA REPAIR N/A 08/31/2014   Procedure: HERNIA REPAIR UMBILICAL ADULT ;  Surgeon: Dalia Heading, MD;  Location: [facility];  Service: General;  Laterality: N/A;   VIDEO ASSISTED THORACOSCOPY (VATS)/THOROCOTOMY Left 07/01/2014   Procedure: with repair left diaphragmatic hernia ;  Surgeon: Loreli Slot, MD;  Location: [facility] OR;  Service: Thoracic;  Laterality: Left;   VIDEO BRONCHOSCOPY N/A 07/01/2014   Procedure: VIDEO BRONCHOSCOPY;  Surgeon: Loreli Slot, MD;  Location: [facility] OR;  Service: Thoracic;  Laterality: N/A;    Current Outpatient Medications  Medication Sig Dispense Refill   acetaminophen (TYLENOL) 500 MG tablet Take 1,000 mg by mouth every 12 (twelve) hours as needed.     albuterol (PROVENTIL HFA;VENTOLIN HFA) 108 (90 BASE) MCG/ACT inhaler Inhale 2 puffs into the lungs every 6 (six) hours as needed for wheezing or shortness of breath.     albuterol (PROVENTIL) (2.5 MG/3ML) 0.083% nebulizer solution Take 2.5 mg by nebulization every 4 (four) hours as needed for wheezing.     aspirin EC 81 MG  tablet Take 1 tablet (81 mg total) by mouth daily.     dapagliflozin propanediol (FARXIGA) 10 MG TABS tablet Take 1 tablet (10 mg total) by mouth daily before breakfast. 30 tablet 11   ENTRESTO 24-26 MG TAKE 1 TABLET BY MOUTH TWICE A DAY 60 tablet 6   ipratropium (ATROVENT) 0.02 % nebulizer solution Take 500 mcg by nebulization every 4 (four) hours as needed for wheezing.     metFORMIN (GLUCOPHAGE) 500 MG tablet Take 500 mg by mouth 2 (two) times daily with a meal. Patient takes 1000 mg in the morning and 500 mg at supper     simvastatin (ZOCOR) 40 MG tablet Take 40 mg by mouth every evening.     carvedilol (COREG) 12.5 MG tablet Take 1.5 tablets (18.75  mg total) by mouth 2 (two) times daily. 270 tablet 3   digoxin (LANOXIN) 0.125 MG tablet TAKE 1 TABLET BY MOUTH DAILY (Patient not taking: Reported on 04/17/2021) 90 tablet 3   spironolactone (ALDACTONE) 25 MG tablet Take 0.5 tablets (12.5 mg total) by mouth daily. 45 tablet 3   No current facility-administered medications for this visit.   Allergies:  Patient has no known allergies.   ROS: No orthopnea or PND, no syncope.  Physical Exam: VS:  BP 102/62   Pulse 90   Ht 5\' 8"  (1.727 m)   Wt 167 lb 3.2 oz (75.8 kg)   SpO2 92%   BMI 25.42 kg/m , BMI Body mass index is 25.42 kg/m.  Wt Readings from Last 3 Encounters:  04/17/21 167 lb 3.2 oz (75.8 kg)  01/12/21 168 lb 3.2 oz (76.3 kg)  11/06/20 163 lb 12.8 oz (74.3 kg)    General: Patient appears comfortable at rest. HEENT: Conjunctiva and lids normal, wearing a mask. Neck: Supple, no elevated JVP or carotid bruits, no thyromegaly. Lungs: Clear to auscultation, nonlabored breathing at rest. Cardiac: Regular rate and rhythm, no S3, 1/6 systolic murmur, no pericardial rub. Extremities: No pitting edema.  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  Recent Labwork: 11/24/2020: BUN 21; Creat 0.97; Potassium 4.7; Sodium 140   Other Studies Reviewed Today:  Echocardiogram 04/13/2021:  1. Left ventricular ejection fraction, by estimation, is 40 to 45%. The  left ventricle has mildly decreased function. The left ventricle  demonstrates global hypokinesis. The left ventricular internal cavity size  was mildly dilated. There is mild left  ventricular hypertrophy. Left ventricular diastolic parameters are  indeterminate. Elevated left atrial pressure.   2. Right ventricular systolic function is normal. The right ventricular  size is normal. Tricuspid regurgitation signal is inadequate for assessing  PA pressure.   3. The  mitral valve is normal in structure. Trivial mitral valve  regurgitation. No evidence of mitral stenosis.   4. The aortic valve was not well visualized. There is mild calcification  of the aortic valve. There is mild thickening of the aortic valve. Aortic  valve regurgitation is not visualized. No aortic stenosis is present.   5. The inferior vena cava is normal in size with greater than 50%  respiratory variability, suggesting right atrial pressure of 3 mmHg.   Assessment and Plan:  1.  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  Follow-up echocardiogram on medical therapy has shown improvement in LVEF to the range of 40 to 45%.  He is symptomatically stable, NYHA class I-II dyspnea and  walking regularly for exercise.  Low normal blood pressure limits up titration of therapy.  Continue Coreg, Aldactone, Entresto, Lanoxin, and [place].  Check BMET for next visit.  2.  Multivessel CAD status post CABG in 2010.  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  Myoview from January was consistent with large infarct scar but no residual ischemia.  Continue aspirin and Zocor.  Medication Adjustments/Labs and Tests Ordered: Current medicines are reviewed at length with the patient today.  Concerns regarding medicines are outlined above.   Tests Ordered: Orders Placed This Encounter  Procedures   Basic metabolic panel     Medication Changes: No orders of the defined types were placed in this encounter.   Disposition:  Follow up  3 months.  Signed, Jonelle Sidle, MD, [facility] 04/17/2021 1:38 PM    [facility] [address], [place], [place] [address] Phone: [phone]; Fax: [phone]

## 2021-07-20 ENCOUNTER — Other Ambulatory Visit (HOSPITAL_COMMUNITY): Payer: Self-pay | Admitting: Family Medicine

## 2021-07-20 ENCOUNTER — Ambulatory Visit (HOSPITAL_COMMUNITY)
Admission: RE | Admit: 2021-07-20 | Discharge: 2021-07-20 | Disposition: A | Discharge status: Home / Self Care | Payer: Medicare HMO | Source: Ambulatory Visit | Attending: Family Medicine | Admitting: Family Medicine

## 2021-07-20 ENCOUNTER — Other Ambulatory Visit: Payer: Self-pay

## 2021-07-20 DIAGNOSIS — M79671 Pain in right foot: Secondary | ICD-10-CM

## 2021-08-01 ENCOUNTER — Other Ambulatory Visit: Payer: Self-pay | Admitting: *Deleted

## 2021-08-01 DIAGNOSIS — K429 Umbilical hernia without obstruction or gangrene: Secondary | ICD-10-CM

## 2021-08-07 ENCOUNTER — Encounter: Payer: Self-pay | Admitting: General Surgery

## 2021-08-07 ENCOUNTER — Other Ambulatory Visit: Payer: Self-pay

## 2021-08-07 ENCOUNTER — Ambulatory Visit: Payer: Medicare HMO | Admitting: General Surgery

## 2021-08-07 VITALS — BP 107/71 | HR 92 | Temp 98.4°F | Resp 14 | Ht 68.0 in | Wt 170.0 lb

## 2021-08-07 DIAGNOSIS — K439 Ventral hernia without obstruction or gangrene: Secondary | ICD-10-CM | POA: Diagnosis not present

## 2021-08-07 NOTE — Progress Notes (Signed)
Mark Rios; [id number]; 02/07/1951   HPI Patient is a 70 year old white male who was referred to my care by Dr. Gareth Morgan for evaluation treatment of a ventral hernia.  Patient states he has noted swelling in the epigastric region for some time now.  It is not causing much discomfort.  It is reducible.  It can be made worse with straining.  He also has a small swelling along the superior aspect of his umbilicus.  He is status post an umbilical herniorrhaphy in 2016.  Given its size, mesh was not used.  The periumbilical hernia does not bother him.  He is status post a CABG in the past.  Patient has multiple comorbidities.  Last echo done earlier this year revealed an ejection fraction around 40 to 45%.  His cardiac status is stable at this present time. Past Medical History:  Diagnosis Date   Arthritis    CAD (coronary artery disease)    Multivessel status post CABG 2010 (LIMA to LAD, SVG to OM1, SVG to PDA - Dr. Dorris Fetch)   Carotid artery disease [facility])    Cervical disc disease    COPD (chronic obstructive pulmonary disease) (HCC)    Erectile dysfunction    Essential hypertension    History of kidney stones        History of stroke 2010   Hyperlipidemia    Hypertension    Pneumothorax    Traumatic diaphragmatic hernia    Repaired 07/01/2014   Type 2 diabetes mellitus (HCC)     Past Surgical History:  Procedure Laterality Date   CAROTID ARTERY ANGIOPLASTY Left    2007   CORONARY ARTERY BYPASS GRAFT     x3   HEMORROIDECTOMY     INGUINAL HERNIA REPAIR Right 08/31/2014   Procedure: HERNIA REPAIR INGUINAL ADULT;  Surgeon: Dalia Heading, MD;  Location: [facility];  Service: General;  Laterality: Right;   INGUINAL HERNIA REPAIR Left 06/12/2016   Procedure: HERNIA REPAIR INGUINAL ADULT WITH MESH;  Surgeon: Franky Macho, MD;  Location: [facility];  Service: General;  Laterality: Left;   INSERTION OF MESH Right 08/31/2014   Procedure: INSERTION OF MESH;  Surgeon: Dalia Heading, MD;   Location: [facility];  Service: General;  Laterality: Right;  inguinal hernia   UMBILICAL HERNIA REPAIR N/A 08/31/2014   Procedure: HERNIA REPAIR UMBILICAL ADULT ;  Surgeon: Dalia Heading, MD;  Location: [facility];  Service: General;  Laterality: N/A;   VIDEO ASSISTED THORACOSCOPY (VATS)/THOROCOTOMY Left 07/01/2014   Procedure: with repair left diaphragmatic hernia ;  Surgeon: Loreli Slot, MD;  Location: [facility] OR;  Service: Thoracic;  Laterality: Left;   VIDEO BRONCHOSCOPY N/A 07/01/2014   Procedure: VIDEO BRONCHOSCOPY;  Surgeon: Loreli Slot, MD;  Location: [facility] OR;  Service: Thoracic;  Laterality: N/A;    Family History  Problem Relation Age of Onset   Coronary artery disease Father     Current Outpatient Medications on File Prior to Visit  Medication Sig Dispense Refill   acetaminophen (TYLENOL) 500 MG tablet Take 1,000 mg by mouth every 12 (twelve) hours as needed.     albuterol (PROVENTIL HFA;VENTOLIN HFA) 108 (90 BASE) MCG/ACT inhaler Inhale 2 puffs into the lungs every 6 (six) hours as needed for wheezing or shortness of breath.     albuterol (PROVENTIL) (2.5 MG/3ML) 0.083% nebulizer solution Take 2.5 mg by nebulization every 4 (four) hours as needed for wheezing.     aspirin EC 81 MG tablet Take 1 tablet (  81 mg total) by mouth daily.     dapagliflozin propanediol (FARXIGA) 10 MG TABS tablet Take 1 tablet (10 mg total) by mouth daily before breakfast. 30 tablet 11   ENTRESTO 24-26 MG TAKE 1 TABLET BY MOUTH TWICE A DAY 60 tablet 6   ipratropium (ATROVENT) 0.02 % nebulizer solution Take 500 mcg by nebulization every 4 (four) hours as needed for wheezing.     metFORMIN (GLUCOPHAGE) 500 MG tablet Take 500 mg by mouth 2 (two) times daily with a meal. Patient takes 1000 mg in the morning and 500 mg at supper     simvastatin (ZOCOR) 40 MG tablet Take 40 mg by mouth every evening.     carvedilol (COREG) 12.5 MG tablet Take 1.5 tablets (18.75 mg total) by mouth 2 (two) times daily. 270  tablet 3   digoxin (LANOXIN) 0.125 MG tablet TAKE 1 TABLET BY MOUTH DAILY (Patient not taking: Reported on 04/17/2021) 90 tablet 3   spironolactone (ALDACTONE) 25 MG tablet Take 0.5 tablets (12.5 mg total) by mouth daily. 45 tablet 3   No current facility-administered medications on file prior to visit.    No Known Allergies  Social History   Substance and Sexual Activity  Alcohol Use No    Social History   Tobacco Use  Smoking Status Former   Packs/day: 0.50   Years: 48.00   Pack years: 24.00   Types: Cigarettes   Quit date: 06/23/2018   Years since quitting: 3.1  Smokeless Tobacco Never    Review of Systems  Constitutional: Negative.   HENT: Negative.    Eyes: Negative.   Respiratory: Negative.    Cardiovascular: Negative.   Gastrointestinal:  Positive for heartburn.  Musculoskeletal:  Positive for back pain.  Skin: Negative.   Neurological: Negative.   Endo/Heme/Allergies: Negative.   Psychiatric/Behavioral: Negative.     Objective   Vitals:   08/07/21 1422  BP: 107/71  Pulse: 92  Resp: 14  Temp: 98.4 F (36.9 C)  SpO2: 93%    Physical Exam Vitals reviewed.  Constitutional:      Appearance: Normal appearance. He is normal weight. He is not ill-appearing.  HENT:     Head: Normocephalic and atraumatic.  Cardiovascular:     Rate and Rhythm: Normal rate and regular rhythm.     Heart sounds: Normal heart sounds. No murmur heard.   No friction rub. No gallop.  Pulmonary:     Effort: Pulmonary effort is normal. No respiratory distress.     Breath sounds: Normal breath sounds. No stridor. No wheezing, rhonchi or rales.  Abdominal:     General: Bowel sounds are normal. There is no distension.     Palpations: Abdomen is soft. There is no mass.     Tenderness: There is no abdominal tenderness. There is no guarding or rebound.     Hernia: A hernia is present.     Comments: A 2 cm reducible epigastric hernia is noted below a surgical scar.  I suspect this  was a chest tube exit site from his CABG.  He also has a very small fat filled supraumbilical hernia.  Skin:    General: Skin is warm and dry.  Neurological:     Mental Status: He is alert and oriented to person, place, and time.    Assessment  Epigastric ventral hernia, incisional hernia at the umbilicus.  Both are asymptomatic at this point. Plan  Given his multiple comorbidities and the fact that these hernias are not symptomatic,  I would do not recommend surgical repair.  He understands and agrees.  I did tell him to call me should he develop pain and significant swelling at both sites.  The risk of incarceration is low.  Literature was given.  Follow-up here as needed.

## 2021-08-18 ENCOUNTER — Other Ambulatory Visit: Payer: Self-pay | Admitting: Cardiology

## 2021-08-21 ENCOUNTER — Other Ambulatory Visit: Payer: Self-pay | Admitting: *Deleted

## 2021-08-21 MED ORDER — SPIRONOLACTONE 25 MG PO TABS
12.5000 mg | ORAL_TABLET | Freq: Every day | ORAL | 1 refills | Status: DC
Start: 2021-08-21 — End: 2022-02-08

## 2021-09-13 ENCOUNTER — Other Ambulatory Visit: Payer: Self-pay | Admitting: Cardiology

## 2021-11-04 ENCOUNTER — Other Ambulatory Visit: Payer: Self-pay | Admitting: Cardiology

## 2021-11-08 ENCOUNTER — Telehealth: Payer: Self-pay | Admitting: Cardiology

## 2021-11-08 MED ORDER — ENTRESTO 24-26 MG PO TABS: 1.0000 | ORAL_TABLET | Freq: Two times a day (BID) | ORAL | 1 refills | Status: DC

## 2021-11-08 NOTE — Telephone Encounter (Signed)
Completed.

## 2021-11-08 NOTE — Telephone Encounter (Signed)
Pt c/o medication issue: ? ?1. Name of Medication: ENTRESTO 24-26 MG ? ?2. How are you currently taking this medication (dosage and times per day)? As written ? ?3. Are you having a reaction (difficulty breathing--STAT)? No  ? ?4. What is your medication issue? Patient has no refills left on last prescription. Please send in new prescription to [id number] - [place], [place] [address] ?

## 2021-11-27 ENCOUNTER — Ambulatory Visit (HOSPITAL_COMMUNITY)
Admission: RE | Admit: 2021-11-27 | Discharge: 2021-11-27 | Disposition: A | Discharge status: Home / Self Care | Payer: Medicare HMO | Source: Ambulatory Visit | Attending: Family Medicine | Admitting: Family Medicine

## 2021-11-27 ENCOUNTER — Other Ambulatory Visit (HOSPITAL_COMMUNITY): Payer: Self-pay | Admitting: Family Medicine

## 2021-11-27 DIAGNOSIS — M25562 Pain in left knee: Secondary | ICD-10-CM

## 2021-12-06 ENCOUNTER — Other Ambulatory Visit: Payer: Self-pay | Admitting: Cardiology

## 2022-01-16 ENCOUNTER — Other Ambulatory Visit: Payer: Self-pay | Admitting: Student

## 2022-02-08 ENCOUNTER — Other Ambulatory Visit: Payer: Self-pay | Admitting: Cardiology

## 2022-02-12 ENCOUNTER — Encounter: Payer: Self-pay | Admitting: Student

## 2022-02-12 ENCOUNTER — Ambulatory Visit: Payer: Medicare HMO | Admitting: Student

## 2022-02-12 VITALS — BP 110/54 | HR 88 | Ht 68.0 in | Wt 171.0 lb

## 2022-02-12 DIAGNOSIS — I10 Essential (primary) hypertension: Secondary | ICD-10-CM | POA: Diagnosis not present

## 2022-02-12 DIAGNOSIS — I255 Ischemic cardiomyopathy: Secondary | ICD-10-CM | POA: Diagnosis not present

## 2022-02-12 DIAGNOSIS — I2510 Atherosclerotic heart disease of native coronary artery without angina pectoris: Secondary | ICD-10-CM | POA: Diagnosis not present

## 2022-02-12 DIAGNOSIS — E785 Hyperlipidemia, unspecified: Secondary | ICD-10-CM

## 2022-02-12 DIAGNOSIS — Z79899 Other long term (current) drug therapy: Secondary | ICD-10-CM

## 2022-02-12 NOTE — Progress Notes (Signed)
Cardiology Office Note    Date:  02/12/2022   ID:  Mark Rios, DOB [birth year], MRN [id number]  PCP:  Gareth Morgan, MD  Cardiologist: Nona Dell, MD    Chief Complaint  Patient presents with   Follow-up    Overdue Visit    History of Present Illness:    Mark Rios is a 71 y.o. male with past medical history of CAD (s/p CABG in 2010 with LIMA-LAD, SVG-OM1 and SVG-PDA, NST in 08/2020 showing scar with no ischemia), HFrEF (EF 20-25% in 07/2020, at 25-30% by echo in 10/2020, at 40-45% in 03/2021), mitral regurgitation, HTN, HLD, Type 2 DM and COPD who presents to the office today for overdue follow-up.  He was last examined by Dr. Diona Browner in 03/2021 and reported being active at baseline and was walking for 30 minutes a day without any anginal symptoms. Recent echocardiogram showed that his EF had improved to 40 to 45% and he was continued on his current medical therapy including Coreg, Aldactone, Entresto, Digoxin and [place].  In talking with the patient today, he reports overall doing well since his last office visit. He continues to walk on a daily basis and denies any recent chest pain or dyspnea on exertion with this. He does use his nebulizer as needed during the day secondary to COPD. He denies any specific orthopnea, PND or pitting edema. No recent palpitations. Does report occasional dizziness with positional changes but says this is sporadic and only lasts for a few seconds and spontaneously resolves. He has not checked his blood pressure at home when this occurs.   Past Medical History:  Diagnosis Date   Arthritis    CAD (coronary artery disease)    Multivessel status post CABG 2010 (LIMA to LAD, SVG to OM1, SVG to PDA - Dr. Dorris Fetch)   Carotid artery disease [facility])    Cervical disc disease    COPD (chronic obstructive pulmonary disease) (HCC)    Erectile dysfunction    Essential hypertension    History of kidney stones        History of stroke 2010    Hyperlipidemia    Hypertension    Pneumothorax    Traumatic diaphragmatic hernia    Repaired 07/01/2014   Type 2 diabetes mellitus (HCC)     Past Surgical History:  Procedure Laterality Date   CAROTID ARTERY ANGIOPLASTY Left    2007   CORONARY ARTERY BYPASS GRAFT     x3   HEMORROIDECTOMY     INGUINAL HERNIA REPAIR Right 08/31/2014   Procedure: HERNIA REPAIR INGUINAL ADULT;  Surgeon: Dalia Heading, MD;  Location: [facility];  Service: General;  Laterality: Right;   INGUINAL HERNIA REPAIR Left 06/12/2016   Procedure: HERNIA REPAIR INGUINAL ADULT WITH MESH;  Surgeon: Franky Macho, MD;  Location: [facility];  Service: General;  Laterality: Left;   INSERTION OF MESH Right 08/31/2014   Procedure: INSERTION OF MESH;  Surgeon: Dalia Heading, MD;  Location: [facility];  Service: General;  Laterality: Right;  inguinal hernia   UMBILICAL HERNIA REPAIR N/A 08/31/2014   Procedure: HERNIA REPAIR UMBILICAL ADULT ;  Surgeon: Dalia Heading, MD;  Location: [facility];  Service: General;  Laterality: N/A;   VIDEO ASSISTED THORACOSCOPY (VATS)/THOROCOTOMY Left 07/01/2014   Procedure: with repair left diaphragmatic hernia ;  Surgeon: Loreli Slot, MD;  Location: [facility] OR;  Service: Thoracic;  Laterality: Left;   VIDEO BRONCHOSCOPY N/A 07/01/2014   Procedure: VIDEO BRONCHOSCOPY;  Surgeon: Viviann Spare  Lars Pinks, MD;  Location: [facility] OR;  Service: Thoracic;  Laterality: N/A;    Current Medications: Outpatient Medications Prior to Visit  Medication Sig Dispense Refill   acetaminophen (TYLENOL) 500 MG tablet Take 1,000 mg by mouth every 12 (twelve) hours as needed.     albuterol (PROVENTIL HFA;VENTOLIN HFA) 108 (90 BASE) MCG/ACT inhaler Inhale 2 puffs into the lungs every 6 (six) hours as needed for wheezing or shortness of breath.     albuterol (PROVENTIL) (2.5 MG/3ML) 0.083% nebulizer solution Take 2.5 mg by nebulization every 4 (four) hours as needed for wheezing.     aspirin EC 81 MG tablet Take 1 tablet (81 mg  total) by mouth daily.     carvedilol (COREG) 12.5 MG tablet Take 1.5 tablets (18.75 mg total) by mouth 2 (two) times daily. 270 tablet 3   digoxin (LANOXIN) 0.125 MG tablet TAKE 1 TABLET BY MOUTH EVERY DAY 90 tablet 3   FARXIGA 10 MG TABS tablet TAKE 1 TABLET BY MOUTH DAILY BEFORE BREAKFAST. 30 tablet 3   ipratropium (ATROVENT) 0.02 % nebulizer solution Take 500 mcg by nebulization every 4 (four) hours as needed for wheezing.     metFORMIN (GLUCOPHAGE) 500 MG tablet Take 500 mg by mouth 2 (two) times daily with a meal. Patient takes 1000 mg in the morning and 500 mg at supper     sacubitril-valsartan (ENTRESTO) 24-26 MG Take 1 tablet by mouth 2 (two) times daily. 180 tablet 1   simvastatin (ZOCOR) 40 MG tablet Take 40 mg by mouth every evening.     spironolactone (ALDACTONE) 25 MG tablet TAKE 1/2 TABLET BY MOUTH EVERY DAY 45 tablet 1   No facility-administered medications prior to visit.     Allergies:   Patient has no known allergies.   Social History   Socioeconomic History   Marital status: Married    Spouse name: Not on file   Number of children: Not on file   Years of education: Not on file   Highest education level: Not on file  Occupational History   Not on file  Tobacco Use   Smoking status: Former    Packs/day: 0.50    Years: 48.00    Total pack years: 24.00    Types: Cigarettes    Quit date: 06/23/2018    Years since quitting: 3.6   Smokeless tobacco: Never  Vaping Use   Vaping Use: Never used  Substance and Sexual Activity   Alcohol use: No   Drug use: No   Sexual activity: Not on file  Other Topics Concern   Not on file  Social History Narrative   Not on file   Social Determinants of Health   Financial Resource Strain: Not on file  Food Insecurity: Not on file  Transportation Needs: Not on file  Physical Activity: Not on file  Stress: Not on file  Social Connections: Not on file     Family History:  The patient's family history includes Coronary  artery disease in his father.   Review of Systems:    Please see the history of present illness.     All other systems reviewed and are otherwise negative except as noted above.   Physical Exam:    VS:  BP (!) 110/54   Pulse 88   Ht 5\' 8"  (1.727 m)   Wt 171 lb (77.6 kg)   SpO2 94%   BMI 26.00 kg/m    General: Well developed, well nourished,male appearing in no acute  distress. Head: Normocephalic, atraumatic. Neck: No carotid bruits. JVD not elevated.  Lungs: Respirations regular and unlabored, without wheezes or rales.  Heart: Regular rate and rhythm. No S3 or S4.  No murmur, no rubs, or gallops appreciated. Abdomen: Appears non-distended. No obvious abdominal masses. Msk:  Strength and tone appear normal for age. No obvious joint deformities or effusions. Extremities: No clubbing or cyanosis. No pitting edema.  Distal pedal pulses are 2+ bilaterally. Varicose veins noted.  Neuro: Alert and oriented X 3. Moves all extremities spontaneously. No focal deficits noted. Psych:  Responds to questions appropriately with a normal affect. Skin: No rashes or lesions noted  Wt Readings from Last 3 Encounters:  02/12/22 171 lb (77.6 kg)  08/07/21 170 lb (77.1 kg)  04/17/21 167 lb 3.2 oz (75.8 kg)     Studies/Labs Reviewed:   EKG:  EKG is ordered today.  The ekg ordered today demonstrates sinus tachycardia, HR 101 with known RBBB and LAFB.   Recent Labs: No results found for requested labs within last 365 days.   Lipid Panel    Component Value Date/Time   CHOL (H) 12/02/2008 1017    205        ATP III CLASSIFICATION:  <200     mg/dL   Desirable  161-096  mg/dL   Borderline High  >=045    mg/dL   High          TRIG 409 (H) 12/02/2008 1017   HDL 27 (L) 12/02/2008 1017   CHOLHDL 7.6 12/02/2008 1017   VLDL 48 (H) 12/02/2008 1017   LDLCALC (H) 12/02/2008 1017    130        Total Cholesterol/HDL:CHD Risk Coronary Heart Disease Risk Table                     Men   Women  1/2  Average Risk   3.4   3.3  Average Risk       5.0   4.4  2 X Average Risk   9.6   7.1  3 X Average Risk  23.4   11.0        Use the calculated Patient Ratio above and the CHD Risk Table to determine the patient's CHD Risk.        ATP III CLASSIFICATION (LDL):  <100     mg/dL   Optimal  811-914  mg/dL   Near or Above                    Optimal  130-159  mg/dL   Borderline  782-956  mg/dL   High  >213     mg/dL   Very High    Additional studies/ records that were reviewed today include:   NST: 08/2020 Nuclear stress EF: 15%. The left ventricular ejection fraction is severely decreased (<30%). There was no ST segment deviation noted during stress. Findings consistent with prior myocardial infarction. This is a high risk study.   Large, fixed perfusion defect with severely reduced EF suggest infarction. No ischemia on perfusion images. Extracardiac tracer uptake in bowel may affect counts in inferior and inferolateral segments at rest and with stress.   Perfusion summary: There is a large fixed perfusion defect present in the basal inferior, basal inferolateral, basal anterolateral, mid inferior, mid inferolateral, apical inferior, apical lateral and apex location. Severe fixed perfusion defect in the apical cap, and basal inferolateral wall.  Moderate fixed perfusion defect in the inferoapical, mid  inferior, mid inferolateral segments. Mild fixed perfusion defect in the lateral apex, basal inferior wall and basal anterolateral wall.   Echocardiogram: 03/2021 IMPRESSIONS     1. Left ventricular ejection fraction, by estimation, is 40 to 45%. The  left ventricle has mildly decreased function. The left ventricle  demonstrates global hypokinesis. The left ventricular internal cavity size  was mildly dilated. There is mild left  ventricular hypertrophy. Left ventricular diastolic parameters are  indeterminate. Elevated left atrial pressure.   2. Right ventricular systolic function  is normal. The right ventricular  size is normal. Tricuspid regurgitation signal is inadequate for assessing  PA pressure.   3. The mitral valve is normal in structure. Trivial mitral valve  regurgitation. No evidence of mitral stenosis.   4. The aortic valve was not well visualized. There is mild calcification  of the aortic valve. There is mild thickening of the aortic valve. Aortic  valve regurgitation is not visualized. No aortic stenosis is present.   5. The inferior vena cava is normal in size with greater than 50%  respiratory variability, suggesting right atrial pressure of 3 mmHg.    Assessment:    1. Coronary artery disease involving native coronary artery of native heart without angina pectoris   2. Ischemic cardiomyopathy   3. Essential hypertension   4. Hyperlipidemia LDL goal <70   5. Medication management      Plan:   In order of problems listed above:  1. CAD - He is s/p CABG in 2010 with LIMA-LAD, SVG-OM1 and SVG-PDA and recent NST in 08/2020 was reassuring as this showed scar with no ischemia. He remains active at baseline and denies any recent anginal symptoms. - Continue current medical therapy with ASA 81 mg daily, Coreg 18.75 mg twice daily and Simvastatin 40 mg daily.   2. HFrEF  - His EF was previously at 20-25% in 07/2020, improved to 40-45% in 03/2021. He denies any recent orthopnea, PND or pitting edema and appears euvolemic on examination today. Would not further titrate medical therapy given reported episodes of dizziness with positional changes. I did encourage him to check his BP when this occurs. Continue current medical therapy for now with Coreg 18.75 mg twice daily, Digoxin 0.125 mg daily, Entresto 24-26 mg twice daily, Farxiga 10 mg daily and Spironolactone 12.5 mg daily. Will obtain updated CBC, CMET and Digoxin level.   3. HTN - His blood pressure is well-controlled at 110/54 during today's visit. Continue current medical therapy.  4. HLD -  He has not had recent labs with his PCP. Will obtain a repeat FLP and LFT's with upcoming labs. He remains on Simvastatin 40 mg daily.  5. Mitral Regurgitation - This was previously mild by echocardiogram in 10/2020 but read as being trivial by most recent imaging in 03/2021. Will continue to follow over time. No murmur appreciated on examination today.    Medication Adjustments/Labs and Tests Ordered: Current medicines are reviewed at length with the patient today.  Concerns regarding medicines are outlined above.  Medication changes, Labs and Tests ordered today are listed in the Patient Instructions below. Patient Instructions  Medication Instructions:  Your physician recommends that you continue on your current medications as directed. Please refer to the Current Medication list given to you today.  *If you need a refill on your cardiac medications before your next appointment, please call your pharmacy*   Lab Work: Your physician recommends that you return for lab work in: Fasting   If you have  labs (blood work) drawn today and your tests are completely normal, you will receive your results only by: MyChart Message (if you have MyChart) OR A paper copy in the mail If you have any lab test that is abnormal or we need to change your treatment, we will call you to review the results.   Testing/Procedures: NONE    Follow-Up: At [facility], you and your health needs are our priority.  As part of our continuing mission to provide you with exceptional heart care, we have created designated Provider Care Teams.  These Care Teams include your primary Cardiologist (physician) and Advanced Practice Providers (APPs -  Physician Assistants and Nurse Practitioners) who all work together to provide you with the care you need, when you need it.  We recommend signing up for the patient portal called "MyChart".  Sign up information is provided on this After Visit Summary.  MyChart is used to  connect with patients for Virtual Visits (Telemedicine).  Patients are able to view lab/test results, encounter notes, upcoming appointments, etc.  Non-urgent messages can be sent to your provider as well.   To learn more about what you can do with MyChart, go to [email or web address].    Your next appointment:   6 month(s)  The format for your next appointment:   In Person  Provider:   Nona Dell, MD    Other Instructions Thank you for choosing [facility]!    Important Information About Sugar         Signed, Ellsworth Lennox, PA-C  02/12/2022 2:09 PM    [facility] [address] [place], [place] [address] Phone: [phone] Fax: [phone]

## 2022-02-25 ENCOUNTER — Other Ambulatory Visit (HOSPITAL_COMMUNITY)
Admission: RE | Admit: 2022-02-25 | Discharge: 2022-02-25 | Disposition: A | Discharge status: Home / Self Care | Payer: Medicare HMO | Source: Ambulatory Visit | Attending: Student | Admitting: Student

## 2022-02-25 ENCOUNTER — Other Ambulatory Visit (HOSPITAL_COMMUNITY): Payer: Self-pay | Admitting: Family Medicine

## 2022-02-25 ENCOUNTER — Other Ambulatory Visit (HOSPITAL_COMMUNITY)
Admission: RE | Admit: 2022-02-25 | Discharge: 2022-02-25 | Disposition: A | Discharge status: Home / Self Care | Payer: Medicare HMO | Source: Ambulatory Visit | Attending: Family Medicine | Admitting: Family Medicine

## 2022-02-25 ENCOUNTER — Ambulatory Visit (HOSPITAL_COMMUNITY)
Admission: RE | Admit: 2022-02-25 | Discharge: 2022-02-25 | Disposition: A | Discharge status: Home / Self Care | Payer: Medicare HMO | Source: Ambulatory Visit | Attending: Family Medicine | Admitting: Family Medicine

## 2022-02-25 DIAGNOSIS — Z8709 Personal history of other diseases of the respiratory system: Secondary | ICD-10-CM | POA: Diagnosis not present

## 2022-02-25 DIAGNOSIS — E119 Type 2 diabetes mellitus without complications: Secondary | ICD-10-CM | POA: Insufficient documentation

## 2022-02-25 DIAGNOSIS — I10 Essential (primary) hypertension: Secondary | ICD-10-CM | POA: Insufficient documentation

## 2022-02-25 DIAGNOSIS — Z125 Encounter for screening for malignant neoplasm of prostate: Secondary | ICD-10-CM | POA: Insufficient documentation

## 2022-02-25 DIAGNOSIS — Z79899 Other long term (current) drug therapy: Secondary | ICD-10-CM | POA: Insufficient documentation

## 2022-02-25 DIAGNOSIS — J449 Chronic obstructive pulmonary disease, unspecified: Secondary | ICD-10-CM | POA: Diagnosis present

## 2022-02-25 DIAGNOSIS — E785 Hyperlipidemia, unspecified: Secondary | ICD-10-CM | POA: Diagnosis not present

## 2022-02-25 DIAGNOSIS — R0989 Other specified symptoms and signs involving the circulatory and respiratory systems: Secondary | ICD-10-CM | POA: Insufficient documentation

## 2022-02-25 DIAGNOSIS — I2510 Atherosclerotic heart disease of native coronary artery without angina pectoris: Secondary | ICD-10-CM | POA: Diagnosis not present

## 2022-02-25 DIAGNOSIS — Z833 Family history of diabetes mellitus: Secondary | ICD-10-CM | POA: Diagnosis not present

## 2022-02-25 DIAGNOSIS — Z5181 Encounter for therapeutic drug level monitoring: Secondary | ICD-10-CM | POA: Diagnosis not present

## 2022-02-25 DIAGNOSIS — R0689 Other abnormalities of breathing: Secondary | ICD-10-CM | POA: Insufficient documentation

## 2022-02-25 DIAGNOSIS — I255 Ischemic cardiomyopathy: Secondary | ICD-10-CM | POA: Insufficient documentation

## 2022-02-25 DIAGNOSIS — N529 Male erectile dysfunction, unspecified: Secondary | ICD-10-CM | POA: Insufficient documentation

## 2022-02-25 DIAGNOSIS — Z87891 Personal history of nicotine dependence: Secondary | ICD-10-CM | POA: Insufficient documentation

## 2022-02-25 LAB — CBC
HCT: 47.6 % (ref 39.0–52.0)
Hemoglobin: 15.8 g/dL (ref 13.0–17.0)
MCH: 32.6 pg (ref 26.0–34.0)
MCHC: 33.2 g/dL (ref 30.0–36.0)
MCV: 98.3 fL (ref 80.0–100.0)
Platelets: 214 10*3/uL (ref 150–400)
RBC: 4.84 MIL/uL (ref 4.22–5.81)
RDW: 13.5 % (ref 11.5–15.5)
WBC: 6.8 10*3/uL (ref 4.0–10.5)
nRBC: 0 % (ref 0.0–0.2)

## 2022-02-25 LAB — COMPREHENSIVE METABOLIC PANEL
ALT: 31 U/L (ref 0–44)
AST: 21 U/L (ref 15–41)
Albumin: 4.2 g/dL (ref 3.5–5.0)
Alkaline Phosphatase: 34 U/L — ABNORMAL LOW (ref 38–126)
Anion gap: 9 (ref 5–15)
BUN: 23 mg/dL (ref 8–23)
CO2: 27 mmol/L (ref 22–32)
Calcium: 9.5 mg/dL (ref 8.9–10.3)
Chloride: 104 mmol/L (ref 98–111)
Creatinine, Ser: 1.15 mg/dL (ref 0.61–1.24)
GFR, Estimated: >60 mL/min (ref >60)
Glucose, Bld: 89 mg/dL (ref 70–99)
Potassium: 4.6 mmol/L (ref 3.5–5.1)
Sodium: 140 mmol/L (ref 135–145)
Total Bilirubin: 1.5 mg/dL — ABNORMAL HIGH (ref 0.3–1.2)
Total Protein: 7.3 g/dL (ref 6.5–8.1)

## 2022-02-25 LAB — PSA: Prostatic Specific Antigen: 0.39 ng/mL (ref 0.00–4.00)

## 2022-02-25 LAB — LIPID PANEL
Cholesterol: 124 mg/dL (ref 0–200)
HDL: 24 mg/dL — ABNORMAL LOW (ref >40)
LDL Cholesterol: UNDETERMINED mg/dL (ref 0–99)
Total CHOL/HDL Ratio: 5.2 RATIO
Triglycerides: 506 mg/dL — ABNORMAL HIGH (ref <150)
VLDL: UNDETERMINED mg/dL (ref 0–40)

## 2022-02-25 LAB — DIGOXIN LEVEL: Digoxin Level: 0.6 ng/mL — ABNORMAL LOW (ref 0.8–2.0)

## 2022-02-25 LAB — LDL CHOLESTEROL, DIRECT: Direct LDL: 51.3 mg/dL (ref 0–99)

## 2022-02-25 LAB — TSH: TSH: 1.865 u[IU]/mL (ref 0.350–4.500)

## 2022-02-28 ENCOUNTER — Telehealth: Payer: Self-pay | Admitting: Student

## 2022-02-28 NOTE — Telephone Encounter (Signed)
Patient is calling back to receive his results 

## 2022-02-28 NOTE — Telephone Encounter (Signed)
Patient notified and verbalized understanding. Patient had no questions at this time.  

## 2022-02-28 NOTE — Telephone Encounter (Signed)
-----   Message from Ellsworth Lennox, [place] sent at 02/26/2022 12:37 PM EDT ----- Please let the patient know his Digoxin level remains within a safe range. Appears his PCP checked labs at the same time as well and I suspect they will call him with those results. Overall, his hemoglobin and platelets were normal. Electrolytes and kidney function stable. Triglycerides elevated but LDL at goal. Try to limit intake of fatty foods such as butter/margarine, gravy, fried foods, etc.

## 2022-05-23 ENCOUNTER — Ambulatory Visit: Payer: Medicare HMO | Admitting: Podiatry

## 2022-06-14 ENCOUNTER — Encounter (HOSPITAL_COMMUNITY): Payer: Self-pay | Admitting: Emergency Medicine

## 2022-06-14 ENCOUNTER — Other Ambulatory Visit: Payer: Self-pay

## 2022-06-14 ENCOUNTER — Emergency Department (HOSPITAL_COMMUNITY)
Admission: EM | Admit: 2022-06-14 | Discharge: 2022-06-15 | Discharge status: Against Medical Advice | Payer: Medicare HMO | Attending: Emergency Medicine | Admitting: Emergency Medicine

## 2022-06-14 DIAGNOSIS — K625 Hemorrhage of anus and rectum: Secondary | ICD-10-CM | POA: Diagnosis present

## 2022-06-14 DIAGNOSIS — Z5321 Procedure and treatment not carried out due to patient leaving prior to being seen by health care provider: Secondary | ICD-10-CM | POA: Insufficient documentation

## 2022-06-14 DIAGNOSIS — R5383 Other fatigue: Secondary | ICD-10-CM | POA: Diagnosis not present

## 2022-06-14 LAB — COMPREHENSIVE METABOLIC PANEL
ALT: 33 U/L (ref 0–44)
AST: 23 U/L (ref 15–41)
Albumin: 4.5 g/dL (ref 3.5–5.0)
Alkaline Phosphatase: 35 U/L — ABNORMAL LOW (ref 38–126)
Anion gap: 8 (ref 5–15)
BUN: 15 mg/dL (ref 8–23)
CO2: 27 mmol/L (ref 22–32)
Calcium: 9.8 mg/dL (ref 8.9–10.3)
Chloride: 106 mmol/L (ref 98–111)
Creatinine, Ser: 1.19 mg/dL (ref 0.61–1.24)
GFR, Estimated: >60 mL/min (ref >60)
Glucose, Bld: 126 mg/dL — ABNORMAL HIGH (ref 70–99)
Potassium: 4.5 mmol/L (ref 3.5–5.1)
Sodium: 141 mmol/L (ref 135–145)
Total Bilirubin: 1.3 mg/dL — ABNORMAL HIGH (ref 0.3–1.2)
Total Protein: 7.6 g/dL (ref 6.5–8.1)

## 2022-06-14 LAB — PROTIME-INR
INR: 1 (ref 0.8–1.2)
Prothrombin Time: 13 seconds (ref 11.4–15.2)

## 2022-06-14 LAB — CBC
HCT: 50.4 % (ref 39.0–52.0)
Hemoglobin: 16.5 g/dL (ref 13.0–17.0)
MCH: 32 pg (ref 26.0–34.0)
MCHC: 32.7 g/dL (ref 30.0–36.0)
MCV: 97.9 fL (ref 80.0–100.0)
Platelets: 212 10*3/uL (ref 150–400)
RBC: 5.15 MIL/uL (ref 4.22–5.81)
RDW: 13.5 % (ref 11.5–15.5)
WBC: 8.2 10*3/uL (ref 4.0–10.5)
nRBC: 0 % (ref 0.0–0.2)

## 2022-06-14 NOTE — ED Triage Notes (Signed)
Patient complains of rectal bleeding that started two weeks ago but got worse over night. Patient states now he sees bright red blood dripping from his anus into the toilet when having a bowel movement. Blood does not drip when he is not having a bowel movement. Patient denies taking a blood thinner.

## 2022-06-14 NOTE — ED Provider Triage Note (Signed)
Emergency Medicine Provider Triage Evaluation Note  Mark Rios , a 71 y.o. male  was evaluated in triage.  Pt complains of rectal bleeding first noticed 2 weeks ago that there was some blood in toilet bowl and was put on fiber by GI practitioner. States his last two Bms have been bloody.   States is not on blood thinner. I do not see anticoag in records.   Hx of hemorroid.   No LH, or SOB. Some mild fatigue  Review of Systems  Positive: BRBPR Negative: Fever   Physical Exam  BP 130/82 (BP Location: Left Arm)   Pulse 88   Temp 98.1 F (36.7 C) (Oral)   Resp 16   SpO2 95%  Gen:   Awake, no distress   Resp:  Normal effort  MSK:   Moves extremities without difficulty  Other:    Medical Decision Making  Medically screening exam initiated at 3:26 PM.  Appropriate orders placed.  Mark Rios was informed that the remainder of the evaluation will be completed by another provider, this initial triage assessment does not replace that evaluation, and the importance of remaining in the ED until their evaluation is complete.  [address]   Pati Gallo [place], [place] 06/14/22 1529

## 2022-06-15 NOTE — ED Notes (Signed)
Pt and family stated that they do not want to wait any longer and will go to Pt's PCP. Pt seen leaving the ED.

## 2022-06-21 ENCOUNTER — Ambulatory Visit (INDEPENDENT_AMBULATORY_CARE_PROVIDER_SITE_OTHER): Payer: Medicare HMO | Admitting: Gastroenterology

## 2022-06-21 ENCOUNTER — Encounter: Payer: Self-pay | Admitting: Gastroenterology

## 2022-06-21 VITALS — BP 124/72 | HR 82 | Temp 97.5°F | Ht 67.0 in | Wt 169.0 lb

## 2022-06-21 DIAGNOSIS — K625 Hemorrhage of anus and rectum: Secondary | ICD-10-CM

## 2022-06-21 DIAGNOSIS — K5732 Diverticulitis of large intestine without perforation or abscess without bleeding: Secondary | ICD-10-CM

## 2022-06-21 HISTORY — DX: Diverticulitis of large intestine without perforation or abscess without bleeding: K57.32

## 2022-06-21 HISTORY — DX: Hemorrhage of anus and rectum: K62.5

## 2022-06-21 NOTE — Patient Instructions (Signed)
Complete your antibiotics. We will request copy of your ER records for review. Plan for colonoscopy in 3-4 weeks to allow time for your diverticulitis to completely heal.  If you have abdominal pain, recurrent bleeding please let us know. For significant bleeding, please go to the ER.

## 2022-06-21 NOTE — Progress Notes (Unsigned)
GI Office Note    Referring Provider: Lemmie Evens, MD Primary Care Physician:  Lemmie Evens, MD  Primary Gastroenterologist:  Chief Complaint   Chief Complaint  Patient presents with   Rectal Bleeding    Was seeing bright red blood in stool, commode and on tissue. Hasn't seen any the past three days. Was going on for three weeks straight.     History of Present Illness   Mark Rios is a 71 y.o. male presenting today  at the request of Dr. Karie Kirks for further evaluation of rectal bleeding.   Patient moved from [place] to [place] few years back. He still has several providers locally. Recently he had multiple episodes of brbpr lasting off/on for 3 weeks. He was evaluated by GI in [place], Dr. Melina Copa, who did rectal exam and noted no blood day of visit. Started him on fiber. Recommended a colonoscopy by his partner, Dr. Caryl Never who is booked up to 08/2022. Patient states he was also seen in the ED at hospital in [facility]). States he had a CT showing diverticulitis and prescribed one week of Cipro/Flagyl. He denies abdominal pain.   Presented to ED 06/14/22 and was seen in triage but did not stay to be seen by provider. Labs at that time showed Hgb 16.5. Hgb 3 months prior was 15.8.  Patient states that he has tried Prep H suppositories with a little relief of rectal itching. He denies diarrhea. Typically having 1-2 stools per day, bristol 2. Usually stools are softer at baseline. Last episode of rectal bleeding was 3 days ago. He states he has no FH of CRC. He has never had a colonoscopy. He has remote hemorrhoid surgery.   Medications   Current Outpatient Medications  Medication Sig Dispense Refill   acetaminophen (TYLENOL) 500 MG tablet Take 1,000 mg by mouth every 12 (twelve) hours as needed.     albuterol (PROVENTIL HFA;VENTOLIN HFA) 108 (90 BASE) MCG/ACT inhaler Inhale 2 puffs into the lungs every 6 (six) hours as needed for wheezing or  shortness of breath.     albuterol (PROVENTIL) (2.5 MG/3ML) 0.083% nebulizer solution Take 2.5 mg by nebulization every 4 (four) hours as needed for wheezing.     aspirin EC 81 MG tablet Take 1 tablet (81 mg total) by mouth daily.     carvedilol (COREG) 12.5 MG tablet Take 1.5 tablets (18.75 mg total) by mouth 2 (two) times daily. 270 tablet 3   ciprofloxacin (CIPRO) 250 MG tablet Take 250 mg by mouth 2 (two) times daily.     digoxin (LANOXIN) 0.125 MG tablet TAKE 1 TABLET BY MOUTH EVERY DAY 90 tablet 3   FARXIGA 10 MG TABS tablet TAKE 1 TABLET BY MOUTH DAILY BEFORE BREAKFAST. 30 tablet 3   ipratropium (ATROVENT) 0.02 % nebulizer solution Take 500 mcg by nebulization every 4 (four) hours as needed for wheezing.     metFORMIN (GLUCOPHAGE) 500 MG tablet Take 500 mg by mouth 2 (two) times daily with a meal. Patient takes 1000 mg in the morning and 500 mg at supper     metroNIDAZOLE (FLAGYL) 500 MG tablet Take 500 mg by mouth 3 (three) times daily.     sacubitril-valsartan (ENTRESTO) 24-26 MG Take 1 tablet by mouth 2 (two) times daily. 180 tablet 1   simvastatin (ZOCOR) 40 MG tablet Take 40 mg by mouth every evening.     spironolactone (ALDACTONE) 25 MG tablet TAKE 1/2 TABLET BY MOUTH EVERY DAY 45 tablet  1   No current facility-administered medications for this visit.    Allergies   Allergies as of 06/21/2022   (No Known Allergies)    Past Medical History   Past Medical History:  Diagnosis Date   Arthritis    CAD (coronary artery disease)    Multivessel status post CABG 2010 (LIMA to LAD, SVG to OM1, SVG to PDA - Dr. Dorris Fetch)   Carotid artery disease [facility])    Cervical disc disease    COPD (chronic obstructive pulmonary disease) (HCC)    Erectile dysfunction    Essential hypertension    History of kidney stones        History of stroke 2010   Hyperlipidemia    Hypertension    Pneumothorax    Traumatic diaphragmatic hernia    Repaired 07/01/2014   Type 2 diabetes mellitus  (HCC)     Past Surgical History   Past Surgical History:  Procedure Laterality Date   CAROTID ARTERY ANGIOPLASTY Left    2007   CORONARY ARTERY BYPASS GRAFT     x3   HEMORROIDECTOMY     INGUINAL HERNIA REPAIR Right 08/31/2014   Procedure: HERNIA REPAIR INGUINAL ADULT;  Surgeon: Dalia Heading, MD;  Location: [facility];  Service: General;  Laterality: Right;   INGUINAL HERNIA REPAIR Left 06/12/2016   Procedure: HERNIA REPAIR INGUINAL ADULT WITH MESH;  Surgeon: Franky Macho, MD;  Location: [facility];  Service: General;  Laterality: Left;   INSERTION OF MESH Right 08/31/2014   Procedure: INSERTION OF MESH;  Surgeon: Dalia Heading, MD;  Location: [facility];  Service: General;  Laterality: Right;  inguinal hernia   UMBILICAL HERNIA REPAIR N/A 08/31/2014   Procedure: HERNIA REPAIR UMBILICAL ADULT ;  Surgeon: Dalia Heading, MD;  Location: [facility];  Service: General;  Laterality: N/A;   VIDEO ASSISTED THORACOSCOPY (VATS)/THOROCOTOMY Left 07/01/2014   Procedure: with repair left diaphragmatic hernia ;  Surgeon: Loreli Slot, MD;  Location: [facility] OR;  Service: Thoracic;  Laterality: Left;   VIDEO BRONCHOSCOPY N/A 07/01/2014   Procedure: VIDEO BRONCHOSCOPY;  Surgeon: Loreli Slot, MD;  Location: [facility];  Service: Thoracic;  Laterality: N/A;    Past Family History   Family History  Problem Relation Age of Onset   Coronary artery disease Father     Past Social History   Social History   Socioeconomic History   Marital status: Married    Spouse name: Not on file   Number of children: Not on file   Years of education: Not on file   Highest education level: Not on file  Occupational History   Not on file  Tobacco Use   Smoking status: Former    Packs/day: 0.50    Years: 48.00    Total pack years: 24.00    Types: Cigarettes    Quit date: 06/23/2018    Years since quitting: 3.9   Smokeless tobacco: Never  Vaping Use   Vaping Use: Never used  Substance and Sexual Activity    Alcohol use: No   Drug use: No   Sexual activity: Yes  Other Topics Concern   Not on file  Social History Narrative   Not on file   Social Determinants of Health   Financial Resource Strain: Not on file  Food Insecurity: Not on file  Transportation Needs: Not on file  Physical Activity: Not on file  Stress: Not on file  Social Connections: Not on file  Intimate Partner Violence: Not  on file    Review of Systems   General: Negative for anorexia, weight loss, fever, chills, fatigue, weakness. Eyes: Negative for vision changes.  ENT: Negative for hoarseness, difficulty swallowing , nasal congestion. CV: Negative for chest pain, angina, palpitations, dyspnea on exertion, peripheral edema.  Respiratory: Negative for dyspnea at rest, dyspnea on exertion, cough, sputum, wheezing.  GI: See history of present illness. GU:  Negative for dysuria, hematuria, urinary incontinence, urinary frequency, nocturnal urination.  MS: Negative for joint pain, low back pain.  Derm: Negative for rash or itching.  Neuro: Negative for weakness, abnormal sensation, seizure, frequent headaches, memory loss,  confusion.  Psych: Negative for anxiety, depression, suicidal ideation, hallucinations.  Endo: Negative for unusual weight change.  Heme: Negative for bruising or bleeding. Allergy: Negative for rash or hives.  Physical Exam   BP 124/72 (BP Location: Right Arm, Patient Position: Sitting, Cuff Size: Normal)   Pulse 82   Temp (!) 97.5 F (36.4 C) (Oral)   Ht 5\' 7"  (1.702 m)   Wt 169 lb (76.7 kg)   SpO2 95%   BMI 26.47 kg/m    General: Well-nourished, well-developed in no acute distress.  Head: Normocephalic, atraumatic.   Eyes: Conjunctiva pink, no icterus. Mouth: Oropharyngeal mucosa moist and pink , no lesions erythema or exudate. Neck: Supple without thyromegaly, masses, or lymphadenopathy.  Lungs: Clear to auscultation bilaterally.  Heart: Regular rate and rhythm, no murmurs rubs or  gallops.  Abdomen: Bowel sounds are normal, nontender, nondistended, no hepatosplenomegaly or masses,  no abdominal bruits or hernia, no rebound or guarding.   Rectal: not performed Extremities: No lower extremity edema. No clubbing or deformities.  Neuro: Alert and oriented x 4 , grossly normal neurologically.  Skin: Warm and dry, no rash or jaundice.   Psych: Alert and cooperative, normal mood and affect.  Labs  See hpi Imaging Studies   No results found.  Assessment   Rectal bleeding: intermittent for several weeks without other associated symptoms. He has had some mild rectal itching. Hgb has been stable and normal. No prior colonoscopy. Prior hemorrhoidectomy in past. He states he has CT showing diverticulitis recently and is completing antibiotics. He denies abdominal pain. He could be bleeding from diverticula, hemorrhoids, malignancy. He will need a colonoscopy to evaluatie.   PLAN   Complete antibiotics. We will request copy of ER records.  Plan for a colonoscopy no sooner than 3-4 weeks from now given possible diverticulitis history. ASA 3.  I have discussed the risks, alternatives, benefits with regards to but not limited to the risk of reaction to medication, bleeding, infection, perforation and the patient is agreeable to proceed. Written consent to be obtained. For recurrent significant rectal bleeding or abdominal pain he should let know or go to the ER if needed.    [place]. Leanna Battles, MHS, PA-C [facility]

## 2022-06-23 ENCOUNTER — Encounter: Payer: Self-pay | Admitting: Gastroenterology

## 2022-06-28 ENCOUNTER — Telehealth: Payer: Self-pay | Admitting: *Deleted

## 2022-06-28 NOTE — Telephone Encounter (Signed)
[facility] to schedule TCS ASA 3

## 2022-07-10 ENCOUNTER — Encounter: Payer: Self-pay | Admitting: *Deleted

## 2022-07-10 MED ORDER — PEG 3350-KCL-NA BICARB-NACL 420 G PO SOLR
4000.0000 mL | Freq: Once | ORAL | 0 refills | Status: AC
Start: 2022-07-10 — End: 2022-07-10

## 2022-07-10 NOTE — Telephone Encounter (Signed)
Pt has been scheduled for 08/07/22 at 10 am with Dr.Carver. Instructions mailed. Prep sent to the pharmacy

## 2022-07-23 ENCOUNTER — Telehealth: Payer: Self-pay | Admitting: Cardiology

## 2022-07-23 NOTE — Telephone Encounter (Signed)
Noted. Will look out for patient PAF paper work.

## 2022-07-23 NOTE — Telephone Encounter (Signed)
Pt called stating he would like to fax over his pt assistance form to office because he cannot drive to the office to drop it off. He would like for someone to be on lookout for it.

## 2022-07-24 ENCOUNTER — Telehealth: Payer: Self-pay | Admitting: Cardiology

## 2022-07-24 NOTE — Telephone Encounter (Signed)
Follow Up:    Patient is calling to see if you received his patient's assistance forms that he faxed yesterday?Marland Kitchen

## 2022-07-24 NOTE — Telephone Encounter (Signed)
Patient notified that we received faxed documents. Patient had no other questions or concerns at this time.

## 2022-07-25 ENCOUNTER — Telehealth: Payer: Self-pay | Admitting: *Deleted

## 2022-07-25 NOTE — Telephone Encounter (Signed)
Pt called and cancelled his procedure on 08/07/22 with Dr.Carver. Pt said he would call back to reschedule.

## 2022-08-01 NOTE — Telephone Encounter (Signed)
Pt approved for the [facility] pt assistance program until 12/331/24.

## 2022-08-05 ENCOUNTER — Encounter (HOSPITAL_COMMUNITY): Payer: Medicare HMO

## 2022-08-07 ENCOUNTER — Encounter (HOSPITAL_COMMUNITY): Admission: RE | Payer: Self-pay | Source: Home / Self Care

## 2022-08-07 ENCOUNTER — Other Ambulatory Visit: Payer: Self-pay | Admitting: Cardiology

## 2022-08-07 ENCOUNTER — Ambulatory Visit (HOSPITAL_COMMUNITY): Admission: RE | Admit: 2022-08-07 | Payer: Medicare HMO | Source: Home / Self Care

## 2022-08-07 SURGERY — COLONOSCOPY WITH PROPOFOL
Anesthesia: Monitor Anesthesia Care

## 2022-08-09 ENCOUNTER — Ambulatory Visit: Payer: Medicare HMO | Attending: Cardiology | Admitting: Cardiology

## 2022-08-09 ENCOUNTER — Encounter: Payer: Self-pay | Admitting: Cardiology

## 2022-08-09 VITALS — BP 124/60 | HR 84 | Ht 67.0 in | Wt 168.0 lb

## 2022-08-09 DIAGNOSIS — I5022 Chronic systolic (congestive) heart failure: Secondary | ICD-10-CM

## 2022-08-09 DIAGNOSIS — I25119 Atherosclerotic heart disease of native coronary artery with unspecified angina pectoris: Secondary | ICD-10-CM

## 2022-08-09 MED ORDER — CARVEDILOL 25 MG PO TABS: 25.0000 mg | ORAL_TABLET | Freq: Two times a day (BID) | ORAL | 3 refills | Status: DC

## 2022-08-09 NOTE — Progress Notes (Signed)
Cardiology Office Note  Date: 08/09/2022   ID: Mark Rios, DOB 1951-02-26, MRN [id number]  PCP:  Lemmie Evens, MD  Cardiologist:  Rozann Lesches, MD Electrophysiologist:  None   Chief Complaint  Patient presents with   Cardiac follow-up    History of Present Illness: Mark Rios is a 71 y.o. male last seen in June by Ms. Strader PA-C, I reviewed the note.  He is here for a routine visit.  States that he has been doing well overall, NYHA class II dyspnea, no angina.  He walks for 30 minutes at a time twice a day.  He has had no palpitations or syncope.  I reviewed his medications which are noted below.  We discussed uptitrating Coreg to 25 mg twice daily.  His last echocardiogram was in August 2022 at which point LVEF was 40 to 45%.  We discussed obtaining an updated study.  Lab work from October is noted below.  His last LDL was 51 in July.  Past Medical History:  Diagnosis Date   Arthritis    CAD (coronary artery disease)    Multivessel status post CABG 2010 (LIMA to LAD, SVG to OM1, SVG to PDA - Dr. Roxan Hockey)   Carotid artery disease [facility])    Cervical disc disease    COPD (chronic obstructive pulmonary disease) ([place])    Erectile dysfunction    Essential hypertension    History of kidney stones        History of stroke 2010   Hyperlipidemia    Hypertension    Pneumothorax    Traumatic diaphragmatic hernia    Repaired 07/01/2014   Type 2 diabetes mellitus ([place])     Past Surgical History:  Procedure Laterality Date   CAROTID ARTERY ANGIOPLASTY Left    2007   CORONARY ARTERY BYPASS GRAFT     x3   HEMORROIDECTOMY     INGUINAL HERNIA REPAIR Right 08/31/2014   Procedure: HERNIA REPAIR INGUINAL ADULT;  Surgeon: Jamesetta So, MD;  Location: [facility];  Service: General;  Laterality: Right;   INGUINAL HERNIA REPAIR Left 06/12/2016   Procedure: HERNIA REPAIR INGUINAL ADULT WITH MESH;  Surgeon: Aviva Signs, MD;  Location: [facility];  Service: General;   Laterality: Left;   INSERTION OF MESH Right 08/31/2014   Procedure: INSERTION OF MESH;  Surgeon: Jamesetta So, MD;  Location: [facility];  Service: General;  Laterality: Right;  inguinal hernia   UMBILICAL HERNIA REPAIR N/A 08/31/2014   Procedure: HERNIA REPAIR UMBILICAL ADULT ;  Surgeon: Jamesetta So, MD;  Location: [facility];  Service: General;  Laterality: N/A;   VIDEO ASSISTED THORACOSCOPY (VATS)/THOROCOTOMY Left 07/01/2014   Procedure: with repair left diaphragmatic hernia ;  Surgeon: Melrose Nakayama, MD;  Location: [facility];  Service: Thoracic;  Laterality: Left;   VIDEO BRONCHOSCOPY N/A 07/01/2014   Procedure: VIDEO BRONCHOSCOPY;  Surgeon: Melrose Nakayama, MD;  Location: [facility] OR;  Service: Thoracic;  Laterality: N/A;    Current Outpatient Medications  Medication Sig Dispense Refill   acetaminophen (TYLENOL) 500 MG tablet Take 1,000 mg by mouth every 12 (twelve) hours as needed.     albuterol (PROVENTIL HFA;VENTOLIN HFA) 108 (90 BASE) MCG/ACT inhaler Inhale 2 puffs into the lungs every 6 (six) hours as needed for wheezing or shortness of breath.     albuterol (PROVENTIL) (2.5 MG/3ML) 0.083% nebulizer solution Take 2.5 mg by nebulization every 4 (four) hours as needed for wheezing.     aspirin EC 81  MG tablet Take 1 tablet (81 mg total) by mouth daily.     carvedilol (COREG) 25 MG tablet Take 1 tablet (25 mg total) by mouth 2 (two) times daily. 180 tablet 3   digoxin (LANOXIN) 0.125 MG tablet TAKE 1 TABLET BY MOUTH EVERY DAY 90 tablet 3   FARXIGA 10 MG TABS tablet TAKE 1 TABLET BY MOUTH DAILY BEFORE BREAKFAST. 30 tablet 3   ipratropium (ATROVENT) 0.02 % nebulizer solution Take 500 mcg by nebulization every 4 (four) hours as needed for wheezing.     metFORMIN (GLUCOPHAGE) 500 MG tablet Take 500 mg by mouth 2 (two) times daily with a meal. Patient takes 1000 mg in the morning and 500 mg at supper     sacubitril-valsartan (ENTRESTO) 24-26 MG Take 1 tablet by mouth 2 (two) times daily. 180  tablet 1   simvastatin (ZOCOR) 40 MG tablet Take 40 mg by mouth every evening.     spironolactone (ALDACTONE) 25 MG tablet TAKE 1/2 TABLET BY MOUTH DAILY 45 tablet 1   No current facility-administered medications for this visit.   Allergies:  Patient has no known allergies.   ROS: No leg swelling, no orthopnea or PND.  Physical Exam: VS:  BP 124/60   Pulse 84   Ht 5\' 7"  (1.702 m)   Wt 168 lb (76.2 kg)   SpO2 98%   BMI 26.31 kg/m , BMI Body mass index is 26.31 kg/m.  Wt Readings from Last 3 Encounters:  08/09/22 168 lb (76.2 kg)  06/21/22 169 lb (76.7 kg)  02/12/22 171 lb (77.6 kg)    General: Patient appears comfortable at rest. HEENT: Conjunctiva and lids normal. Neck: Supple, no elevated JVP or carotid bruits. Lungs: Clear to auscultation, nonlabored breathing at rest. Cardiac: Regular rate and rhythm, no S3, 1/6 systolic murmur. Extremities: No pitting edema.  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  Increased voltage.  Recent Labwork: 02/25/2022: TSH 1.865 06/14/2022: ALT 33; AST 23; BUN 15; Creatinine, Ser 1.19; Hemoglobin 16.5; Platelets 212; Potassium 4.5; Sodium 141     Component Value Date/Time   CHOL 124 02/25/2022 1543   TRIG 506 (H) 02/25/2022 1543   HDL 24 (L) 02/25/2022 1543   CHOLHDL 5.2 02/25/2022 1543   VLDL UNABLE TO CALCULATE IF TRIGLYCERIDE OVER 400 mg/dL 04/28/2022 75/64/3329   LDLCALC UNABLE TO CALCULATE IF TRIGLYCERIDE OVER 400 mg/dL 5188 41/66/0630   LDLDIRECT 51.3 02/25/2022 1543    Other Studies Reviewed Today:  Echocardiogram 04/13/2021:  1. Left ventricular ejection fraction, by estimation, is 40 to 45%. The  left ventricle has mildly decreased function. The left ventricle  demonstrates global hypokinesis. The left ventricular internal cavity size  was mildly dilated. There is mild left  ventricular hypertrophy. Left  ventricular diastolic parameters are  indeterminate. Elevated left atrial pressure.   2. Right ventricular systolic function is normal. The right ventricular  size is normal. Tricuspid regurgitation signal is inadequate for assessing  PA pressure.   3. The mitral valve is normal in structure. Trivial mitral valve  regurgitation. No evidence of mitral stenosis.   4. The aortic valve was not well visualized. There is mild calcification  of the aortic valve. There is mild thickening of the aortic valve. Aortic  valve regurgitation is not visualized. No aortic stenosis is present.   5. The inferior vena cava is normal in size with greater than 50%  respiratory variability, suggesting right atrial pressure of 3 mmHg.   Assessment and Plan:  1.  HFmrEF with mixed ischemic/nonischemic cardiomyopathy.  LVEF 40 to 45% by echocardiogram in August 2022.  He is clinically stable with NYHA class II dyspnea, no weight gain.  Increase Coreg to 25 mg twice daily and otherwise continue present doses of Lanoxin, Farxiga, Entresto, and Aldactone.  Check follow-up echocardiogram.  2.  Multivessel CAD status post CABG in 2010.  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  No active angina.  Continue aspirin and Zocor.  Medication Adjustments/Labs and Tests Ordered: Current medicines are reviewed at length with the patient today.  Concerns regarding medicines are outlined above.   Tests Ordered: Orders Placed This Encounter  Procedures   ECHOCARDIOGRAM COMPLETE    Medication Changes: Meds ordered this encounter  Medications   carvedilol (COREG) 25 MG tablet    Sig: Take 1 tablet (25 mg total) by mouth 2 (two) times daily.    Dispense:  180 tablet    Refill:  3    08/09/22 increased to 25 mg bid    Disposition:  Follow up  6 months.  Signed, Satira Sark, MD, [facility] 08/09/2022 1:16 PM    [facility] at [facility] [address], [place], [place] [address] Phone: [phone]; Fax: [phone]

## 2022-08-09 NOTE — Patient Instructions (Addendum)
Medication Instructions:   INCREASE Coreg to 25 mg twice d ay  Labwork: NONE TODAY  Testing/Procedures: Your physician has requested that you have an echocardiogram. Echocardiography is a painless test that uses sound waves to create images of your heart. It provides your doctor with information about the size and shape of your heart and how well your heart's chambers and valves are working. This procedure takes approximately one hour. There are no restrictions for this procedure. Please do NOT wear cologne, perfume, aftershave, or lotions (deodorant is allowed). Please arrive 15 minutes prior to your appointment time.   Follow-Up: 6 MONTHS  Any Other Special Instructions Will Be Listed Below (If Applicable).  If you need a refill on your cardiac medications before your next appointment, please call your pharmacy.

## 2022-08-20 ENCOUNTER — Telehealth: Payer: Self-pay | Admitting: Gastroenterology

## 2022-08-20 DIAGNOSIS — N289 Disorder of kidney and ureter, unspecified: Secondary | ICD-10-CM

## 2022-08-20 NOTE — Telephone Encounter (Signed)
Lmom for pt to return my call.  

## 2022-08-20 NOTE — Telephone Encounter (Signed)
Following up on outside records. Reviewed CT report. No diverticulitis when seen in the ED 05/2022. He did have bilateral kidney lesions and radiologist recommended renal ultrasound to evaluate.   Please let pt know. He can have this done with his PCP or if he wants [place] to schedule we can order renal ultrasound for bilateral renal masses.   He can call back when ready to schedule colonoscopy.

## 2022-08-20 NOTE — Telephone Encounter (Signed)
Pt is requesting that we schedule the [place].

## 2022-08-21 NOTE — Telephone Encounter (Signed)
US renal scheduled for 1/9, arrival 12:15pm, arrive with full bladder.  Called pt, aware of appt details.

## 2022-08-21 NOTE — Telephone Encounter (Signed)
Ok to schedule renal u/s for bilateral kidney lesions on CT

## 2022-08-21 NOTE — Addendum Note (Signed)
Addended by: Cheron Every on: 08/21/2022 04:20 PM   Modules accepted: Orders

## 2022-08-26 ENCOUNTER — Ambulatory Visit (HOSPITAL_COMMUNITY)
Admission: RE | Admit: 2022-08-26 | Discharge: 2022-08-26 | Disposition: A | Discharge status: Home / Self Care | Payer: PPO | Source: Ambulatory Visit | Attending: Cardiology | Admitting: Cardiology

## 2022-08-26 DIAGNOSIS — I255 Ischemic cardiomyopathy: Secondary | ICD-10-CM

## 2022-08-26 DIAGNOSIS — I5022 Chronic systolic (congestive) heart failure: Secondary | ICD-10-CM

## 2022-08-26 LAB — ECHOCARDIOGRAM COMPLETE
Area-P 1/2: 3.91 cm2
Calc EF: 36.7 %
Est EF: 35
MV M vel: 2.76 m/s
MV Peak grad: 30.5 mmHg
S' Lateral: 4.5 cm
Single Plane A2C EF: 42.3 %
Single Plane A4C EF: 31.1 %

## 2022-08-26 NOTE — Progress Notes (Signed)
  Echocardiogram 2D Echocardiogram has been performed.  Mark Rios 08/26/2022, 2:05 PM

## 2022-08-27 ENCOUNTER — Ambulatory Visit (HOSPITAL_COMMUNITY): Payer: PPO

## 2022-09-03 ENCOUNTER — Ambulatory Visit (HOSPITAL_COMMUNITY)
Admission: RE | Admit: 2022-09-03 | Discharge: 2022-09-03 | Disposition: A | Discharge status: Home / Self Care | Payer: PPO | Source: Ambulatory Visit | Attending: Gastroenterology | Admitting: Gastroenterology

## 2022-09-03 DIAGNOSIS — N289 Disorder of kidney and ureter, unspecified: Secondary | ICD-10-CM | POA: Diagnosis present

## 2022-09-03 DIAGNOSIS — N281 Cyst of kidney, acquired: Secondary | ICD-10-CM | POA: Diagnosis not present

## 2022-09-09 ENCOUNTER — Encounter (INDEPENDENT_AMBULATORY_CARE_PROVIDER_SITE_OTHER): Payer: Self-pay | Admitting: *Deleted

## 2022-09-09 ENCOUNTER — Other Ambulatory Visit (INDEPENDENT_AMBULATORY_CARE_PROVIDER_SITE_OTHER): Payer: Self-pay | Admitting: *Deleted

## 2022-09-09 DIAGNOSIS — N289 Disorder of kidney and ureter, unspecified: Secondary | ICD-10-CM

## 2022-09-26 ENCOUNTER — Ambulatory Visit (HOSPITAL_COMMUNITY)
Admission: RE | Admit: 2022-09-26 | Discharge: 2022-09-26 | Disposition: A | Discharge status: Home / Self Care | Payer: PPO | Source: Ambulatory Visit | Attending: Gastroenterology | Admitting: Gastroenterology

## 2022-09-26 DIAGNOSIS — N289 Disorder of kidney and ureter, unspecified: Secondary | ICD-10-CM | POA: Insufficient documentation

## 2022-09-26 MED ORDER — GADOBUTROL 1 MMOL/ML IV SOLN
7.0000 mL | Freq: Once | INTRAVENOUS | Status: AC | PRN
  Administered 2022-09-26: 7 mL via INTRAVENOUS

## 2022-10-25 ENCOUNTER — Telehealth: Payer: Self-pay | Admitting: Cardiology

## 2022-10-25 NOTE — Telephone Encounter (Signed)
Spoke with [facility] again and was able to order meds for the pt. They state that his Delene Loll should be to him by 3/12.

## 2022-10-25 NOTE — Telephone Encounter (Signed)
Pt c/o medication issue:  1. Name of Medication:   sacubitril-valsartan (ENTRESTO) 24-26 MG    2. How are you currently taking this medication (dosage and times per day)?   3. Are you having a reaction (difficulty breathing--STAT)?   4. What is your medication issue? Patient states they need another refill of this med sent to patient assistance.

## 2022-10-25 NOTE — Telephone Encounter (Signed)
Spoke with Karena Addison at [employer]. She states that the pt does have refills on file and will just need to call in to have them filled. Spoke with pt and number ( [phone]) given to pt to call and Rx number ([id number]) given.

## 2022-10-25 NOTE — Telephone Encounter (Signed)
Caller states he called the [facility] and they told him he did not have any refills to be filled at this time.  Patient wants to know next steps.

## 2023-01-02 ENCOUNTER — Other Ambulatory Visit: Payer: Self-pay | Admitting: Cardiology

## 2023-01-31 ENCOUNTER — Other Ambulatory Visit: Payer: Self-pay | Admitting: Student

## 2023-02-20 ENCOUNTER — Other Ambulatory Visit: Payer: Self-pay | Admitting: Cardiology

## 2023-03-12 DIAGNOSIS — K7581 Nonalcoholic steatohepatitis (NASH): Secondary | ICD-10-CM | POA: Insufficient documentation

## 2023-03-12 DIAGNOSIS — N401 Enlarged prostate with lower urinary tract symptoms: Secondary | ICD-10-CM | POA: Insufficient documentation

## 2023-03-12 DIAGNOSIS — E119 Type 2 diabetes mellitus without complications: Secondary | ICD-10-CM

## 2023-03-12 HISTORY — DX: Benign prostatic hyperplasia with lower urinary tract symptoms: N40.1

## 2023-03-12 HISTORY — DX: Nonalcoholic steatohepatitis (NASH): K75.81

## 2023-03-12 HISTORY — DX: Type 2 diabetes mellitus without complications: E11.9

## 2023-04-27 ENCOUNTER — Other Ambulatory Visit: Payer: Self-pay | Admitting: Cardiology

## 2023-05-13 ENCOUNTER — Other Ambulatory Visit: Payer: Self-pay | Admitting: Cardiology

## 2023-05-13 MED ORDER — SPIRONOLACTONE 25 MG PO TABS: 12.5000 mg | ORAL_TABLET | Freq: Every day | ORAL | 0 refills | Status: DC

## 2023-05-13 MED ORDER — CARVEDILOL 25 MG PO TABS: 25.0000 mg | ORAL_TABLET | Freq: Two times a day (BID) | ORAL | 0 refills | Status: DC

## 2023-05-13 MED ORDER — DIGOXIN 125 MCG PO TABS: 125.0000 ug | ORAL_TABLET | Freq: Every day | ORAL | 0 refills | Status: DC

## 2023-05-27 ENCOUNTER — Other Ambulatory Visit: Payer: Self-pay | Admitting: Cardiology

## 2023-06-18 ENCOUNTER — Encounter: Payer: Self-pay | Admitting: Cardiology

## 2023-06-18 ENCOUNTER — Ambulatory Visit: Payer: Medicare HMO | Attending: Cardiology | Admitting: Cardiology

## 2023-06-18 VITALS — BP 108/70 | HR 81 | Ht 67.0 in | Wt 170.0 lb

## 2023-06-18 DIAGNOSIS — I10 Essential (primary) hypertension: Secondary | ICD-10-CM | POA: Diagnosis not present

## 2023-06-18 DIAGNOSIS — I25119 Atherosclerotic heart disease of native coronary artery with unspecified angina pectoris: Secondary | ICD-10-CM | POA: Diagnosis not present

## 2023-06-18 DIAGNOSIS — I5022 Chronic systolic (congestive) heart failure: Secondary | ICD-10-CM

## 2023-06-18 DIAGNOSIS — E782 Mixed hyperlipidemia: Secondary | ICD-10-CM

## 2023-06-18 DIAGNOSIS — I5020 Unspecified systolic (congestive) heart failure: Secondary | ICD-10-CM

## 2023-06-18 NOTE — Progress Notes (Signed)
Cardiology Office Note  Date: 06/18/2023   ID: Mark, Rios 11/01/1950, MRN [id number]  History of Present Illness: Mark Rios is a 72 y.o. male last seen in December 2023.  He is here for a routine visit.  Reports NYHA class I-II dyspnea, no exertional angina, no palpitations or syncope.  He walks his dog 30 minutes twice a day.  No change in stamina.  I reviewed his medications.  Current cardiac regimen includes aspirin, Coreg, Farxiga, Lanoxin, Entresto, Aldactone, and Zocor.  ECG today shows sinus rhythm with right bundle branch block, left anterior fascicular block and nonspecific ST changes.  He had recent lab work which is outlined below.  Physical Exam: VS:  BP 108/70 (BP Location: Left Arm, Patient Position: Sitting, Cuff Size: Normal)   Pulse 81   Ht 5\' 7"  (1.702 m)   Wt 170 lb (77.1 kg)   SpO2 90%   BMI 26.63 kg/m , BMI Body mass index is 26.63 kg/m.  Wt Readings from Last 3 Encounters:  06/18/23 170 lb (77.1 kg)  08/09/22 168 lb (76.2 kg)  06/21/22 169 lb (76.7 kg)    General: Patient appears comfortable at rest. HEENT: Conjunctiva and lids normal. Neck: Supple, no elevated JVP or carotid bruits. Lungs: Clear to auscultation, nonlabored breathing at rest. Cardiac: Regular rate and rhythm, no S3, 1/6 systolic murmur. Extremities: No pitting edema.  ECG:  An ECG dated 02/12/2022 was personally reviewed today and demonstrated:  Sinus tachycardia with right bundle branch block and left anterior fascicular block, increased voltage.  Labwork:    Component Value Date/Time   CHOL 124 02/25/2022 1543   TRIG 506 (H) 02/25/2022 1543   HDL 24 (L) 02/25/2022 1543   CHOLHDL 5.2 02/25/2022 1543   VLDL UNABLE TO CALCULATE IF TRIGLYCERIDE OVER 400 mg/dL 40/98/1191 4782   LDLCALC UNABLE TO CALCULATE IF TRIGLYCERIDE OVER 400 mg/dL 95/62/1308 6578   LDLDIRECT 51.3 02/25/2022 1543  October 2024: Cholesterol 132, triglycerides 419, HDL 27, LDL 63, hemoglobin  A1c 7.1%, potassium 5.0, BUN 19, creatinine 1.27, AST 18, ALT 36, hemoglobin 17.1, platelets 173  Other Studies Reviewed Today:  Echocardiogram 08/26/2022:  1. Left ventricular ejection fraction, by estimation, is 35%. The left  ventricle has moderately decreased function. The left ventricle  demonstrates regional wall motion abnormalities (see scoring  diagram/findings for description). Left ventricular  diastolic parameters are consistent with Grade I diastolic dysfunction  (impaired relaxation).   2. Right ventricular systolic function is mildly reduced. The right  ventricular size is mildly enlarged.   3. Right atrial size was mildly dilated.   4. The mitral valve is grossly normal. Trivial mitral valve  regurgitation. No evidence of mitral stenosis.   5. The aortic valve was not well visualized. Aortic valve regurgitation  is trivial. No aortic stenosis is present.   6. The inferior vena cava is normal in size with greater than 50%  respiratory variability, suggesting right atrial pressure of 3 mmHg.   Assessment and Plan:  1.  Multivessel CAD status post CABG in 2010 with LIMA to LAD, SVG to OM1, and SVG to PDA.  He does not report any angina on medical therapy and has stable exercise tolerance with his walking plan.  Continue aspirin and Zocor.  2.  HFmrEF, I reviewed the echocardiogram images from January, LVEF approximately 40% at that time and relatively stable.  Symptomatically stable with NYHA class I-II dyspnea and no fluid retention.  Continue Coreg, [place], Lanoxin, [place], and  Aldactone.  I reviewed his recent lab work.  3.  Primary hypertension.  Blood pressure well-controlled today on current regimen.  4.  Mixed hyperlipidemia.  Recent LDL 63.  Continue Zocor.  Disposition:  Follow up  6 months with echocardiogram.  Signed, Jonelle Sidle, M.D., F.A.C.C. [facility]

## 2023-06-18 NOTE — Patient Instructions (Signed)
Medication Instructions:  Your physician recommends that you continue on your current medications as directed. Please refer to the Current Medication list given to you today.   Labwork: None today  Testing/Procedures: Your physician has requested that you have an echocardiogram in 6 months. Echocardiography is a painless test that uses sound waves to create images of your heart. It provides your doctor with information about the size and shape of your heart and how well your heart's chambers and valves are working. This procedure takes approximately one hour. There are no restrictions for this procedure. Please do NOT wear cologne, perfume, aftershave, or lotions (deodorant is allowed). Please arrive 15 minutes prior to your appointment time.   Follow-Up: 6 months  Any Other Special Instructions Will Be Listed Below (If Applicable).  If you need a refill on your cardiac medications before your next appointment, please call your pharmacy.

## 2023-06-21 DIAGNOSIS — G4733 Obstructive sleep apnea (adult) (pediatric): Secondary | ICD-10-CM

## 2023-06-21 DIAGNOSIS — E663 Overweight: Secondary | ICD-10-CM | POA: Insufficient documentation

## 2023-06-21 HISTORY — DX: Overweight: E66.3

## 2023-06-21 HISTORY — DX: Obstructive sleep apnea (adult) (pediatric): G47.33

## 2023-06-27 ENCOUNTER — Telehealth: Payer: Self-pay | Admitting: *Deleted

## 2023-06-27 NOTE — Telephone Encounter (Signed)
Called and left msg on voicemail to let pt know that he has been approved for [facility] patient assistance.

## 2023-07-07 ENCOUNTER — Telehealth: Payer: Self-pay | Admitting: Cardiology

## 2023-07-07 ENCOUNTER — Ambulatory Visit: Payer: PPO

## 2023-07-07 NOTE — Telephone Encounter (Signed)
Reports SOB with activity, fatigue that started since seeing provider in October. Reports intermittent chest pain rated 5/10 that started last week and described as feeling heavy. Reports intermittent feeling of heart flutters since last week. Denies active chest pain or flutters. Reports productive cough for the past 2 weeks that he saw his PCP and was given prednisone and z-pack and says they didn't help very much. Denies dizziness or swelling. Has not checked vitals at home. Gave first available appointment to see Philis Nettle on 07/31/2023 @8 :30 am [facility] office. Made attempt to office nurse visit for EKG but due to patient living in [place], he request to see MD on same day. Informed patient that we do have [facility] in [place] and we would try to get him an appointment at that office as well. Patient agrees to transferring care to [facility]. Encouraged to reach back out to PCP for non-cardiac causes of symptoms as well or advised he could go to Urgent Care. Also advised, if he develops worsening symptoms, to go to the ED for an evaluation. Verbalized understanding of plan.

## 2023-07-07 NOTE — Telephone Encounter (Signed)
Pt c/o Shortness Of Breath: STAT if SOB developed within the last 24 hours or pt is noticeably SOB on the phone  1. Are you currently SOB (can you hear that pt is SOB on the phone)? no  2. How long have you been experiencing SOB? Since last week  3. Are you SOB when sitting or when up moving around? Moving around  4. Are you currently experiencing any other symptoms? Heart feels like it is jumping

## 2023-07-08 ENCOUNTER — Ambulatory Visit: Payer: Medicare HMO

## 2023-07-08 VITALS — BP 110/60 | HR 93 | Ht 67.0 in | Wt 169.2 lb

## 2023-07-08 DIAGNOSIS — I509 Heart failure, unspecified: Secondary | ICD-10-CM

## 2023-07-08 DIAGNOSIS — I25708 Atherosclerosis of coronary artery bypass graft(s), unspecified, with other forms of angina pectoris: Secondary | ICD-10-CM | POA: Diagnosis not present

## 2023-07-08 DIAGNOSIS — R0609 Other forms of dyspnea: Secondary | ICD-10-CM | POA: Insufficient documentation

## 2023-07-08 DIAGNOSIS — I5022 Chronic systolic (congestive) heart failure: Secondary | ICD-10-CM

## 2023-07-08 HISTORY — DX: Other forms of dyspnea: R06.09

## 2023-07-08 HISTORY — DX: Heart failure, unspecified: I50.9

## 2023-07-08 MED ORDER — FUROSEMIDE 20 MG PO TABS: 20.0000 mg | ORAL_TABLET | Freq: Every day | ORAL | 3 refills | Status: DC

## 2023-07-08 MED ORDER — FUROSEMIDE 20 MG PO TABS: 20.0000 mg | ORAL_TABLET | ORAL | 3 refills | Status: DC

## 2023-07-08 NOTE — Patient Instructions (Signed)
Medication Instructions:  Your physician has recommended you make the following change in your medication:   START: Lasix 20 mg every other day  *If you need a refill on your cardiac medications before your next appointment, please call your pharmacy*   Lab Work: Your physician recommends that you return for lab work in:   Labs today: CMP, Magnesium, Pro BNP, CBC  If you have labs (blood work) drawn today and your tests are completely normal, you will receive your results only by: MyChart Message (if you have MyChart) OR A paper copy in the mail If you have any lab test that is abnormal or we need to change your treatment, we will call you to review the results.   Testing/Procedures: Your physician has requested that you have an echocardiogram. Echocardiography is a painless test that uses sound waves to create images of your heart. It provides your doctor with information about the size and shape of your heart and how well your heart's chambers and valves are working. This procedure takes approximately one hour. There are no restrictions for this procedure. Please do NOT wear cologne, perfume, aftershave, or lotions (deodorant is allowed). Please arrive 15 minutes prior to your appointment time.  Please note: We ask at that you not bring children with you during ultrasound (echo/ vascular) testing. Due to room size and safety concerns, children are not allowed in the ultrasound rooms during exams. Our front office staff cannot provide observation of children in our lobby area while testing is being conducted. An adult accompanying a patient to their appointment will only be allowed in the ultrasound room at the discretion of the ultrasound technician under special circumstances. We apologize for any inconvenience.  Cardiac PET stress test   Follow-Up: At [facility], you and your health needs are our priority.  As part of our continuing mission to provide you with exceptional  heart care, we have created designated Provider Care Teams.  These Care Teams include your primary Cardiologist (physician) and Advanced Practice Providers (APPs -  Physician Assistants and Nurse Practitioners) who all work together to provide you with the care you need, when you need it.  We recommend signing up for the patient portal called "MyChart".  Sign up information is provided on this After Visit Summary.  MyChart is used to connect with patients for Virtual Visits (Telemedicine).  Patients are able to view lab/test results, encounter notes, upcoming appointments, etc.  Non-urgent messages can be sent to your provider as well.   To learn more about what you can do with MyChart, go to [email or web address].    Your next appointment:   1 month(s)  Provider:   Huntley Dec, MD    Other Instruction None

## 2023-07-08 NOTE — Assessment & Plan Note (Signed)
Likely multifactorial in the setting of COPD exacerbation. Cannot entirely exclude progressive angina given his underlying history of CAD, and worsening of heart failure symptoms although he does not show any significant signs of fluid overload.  Will obtain CBC, CMP, magnesium, proBNP. Will assess for any significant anemia burden and anginal by obtaining a cardiac PET stress test. Will obtain a transthoracic echocardiogra advised to avoid any moderate exertion. If symptoms are worsening acutely should head to the ER or call 911. m to assess for any significant interval change in his LV function or valve function.

## 2023-07-08 NOTE — Assessment & Plan Note (Signed)
Unclear details about this CAD CABG specifics and the timing. Last stress test from January 2022 showed no significant ischemia burden. Continue with aspirin and simvastatin at current doses.

## 2023-07-08 NOTE — Progress Notes (Signed)
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  Previously following up with cardiologist Dr. Diona Browner at [facility], now here to establish care to follow-up closer to home.  Problem List Items Addressed This Visit     CAD (coronary artery disease)    Unclear details about this CAD CABG specifics and the timing. Last stress test from January 2022 showed no significant ischemia burden. Continue with aspirin and simvastatin at current doses.       Relevant Medications   furosemide (LASIX) 20 MG tablet   CHF (congestive heart failure) (HCC), ischemic cardiomyopathy, reduced LVEF 35%, NYHA class II    Does not acutely appear volume overloaded.  Advised to continue dietary sodium restriction to below 2 g/day and fluid restriction to below 2 L/day. Advised to avoid stimulants such as [facility] and abstain from it. Consume water. 1 of 2 cups of coffee a day is okay.  Continue with goal-directed medical therapy with carvedilol 25 mg twice daily, Farxiga 10 mg twice daily, Entresto 24 mg / 26 mg twice daily, spironolactone 25 mg once daily. He has been continued on digoxin long-term 0.125 mg once a day, continue  this for now.        Relevant Medications   furosemide (LASIX) 20 MG tablet   Dyspnea on exertion - Primary    Likely multifactorial in the setting of COPD exacerbation. Cannot entirely exclude progressive angina given his underlying history of CAD, and worsening of heart failure symptoms although he does not show any significant signs of fluid overload.  Will obtain CBC, CMP, magnesium, proBNP. Will assess for any significant anemia burden and anginal by obtaining a cardiac PET stress test. Will obtain a transthoracic echocardiogra advised to avoid any moderate exertion. If symptoms are worsening acutely should head to the ER or call 911. m to assess for any significant interval change in his LV function or valve function.      Relevant Orders   EKG 12-Lead (Completed)   Comp Met (CMET)   Magnesium   Pro b natriuretic peptide (BNP)   CBC   ECHOCARDIOGRAM COMPLETE   NM PET CT CARDIAC PERFUSION MULTI W/ABSOLUTE BLOODFLOW   Return to clinic for follow-up in 1 month.   History of Present Illness:    Mark Rios is a 72 y.o. male who is being seen today for follow-up for evaluation of dyspnea on exertion in the setting of heart failure.  Last visit with cardiology was 06/18/2023 at [facility] with Dr. Diona Browner. PCP is Delmer Islam, NP-C.  History of coronary artery disease s/p CABG in 2010 [LIMA-LAD, SVG-OM, SVG-PDA], CHF chronic with reduced ejection fraction EF 35% on last TTE January  2024, CVA, hypertension, dyslipidemia with severe hypertriglyceridemia, COPD, former smoker [quit 10 years ago], carotid artery disease s/p angioplasty in 2007, CKD stage II, traumatic diaphragmatic hernia in 2015 underwent surgical repair  Pleasant gentleman here for the visit by himself.  Lives with his wife at home. He tells me he has a history of CABG was 10 years ago at [facility] although I do not see any records of this will be done at [facility].  He mentions this was in the  setting of stroke following which he was in the hospital for 45 days and underwent CABG.  Since then has not had any further revascularization procedures.  In 2015 his echocardiogram reported EF 55% with regional wall motion abnormality this was in the setting of his traumatic diaphragmatic hernia for which he underwent surgical repair. Subsequently in December 2021 his echocardiogram showed LVEF further dropped to 20 to 25% after this he underwent evaluation with Lexiscan stress test nuclear imaging January 2022 that showed large inferior scar tissue but no significant ischemia burden.  He was medically optimized for guideline directed medical therapy and subsequently showed improvement in EF to 40 to 45%.  Subsequent echocardiogram January 2024 with LVEF reported 35%, this was felt more aligned with 40% EF and continued on medical therapy.  At his recent follow-up with cardiologist on October 30 he reported to be doing well and stable from his functional capacity standpoint.  Over the last 2 to 3 weeks he has been dealing with symptoms of increased shortness of breath associated with cough and sputum production for which she has been treated for COPD exacerbation.  This has not significantly improved and relieved his symptoms.  In fact he is now over the last week or so dealing with symptoms of dyspnea with exertion, relieving with rest. This is associated with chest heaviness.  Relieved with rest. He also reports sensation of fluttering in the chest can occur randomly through the day and can last for few seconds.  More frequently towards the end of the day.  He denies any syncopal or near syncopal episodes.  He had similar symptoms in December 2021 where 7-day Zio patch noted 1.4% PVC burden, rare supraventricular ectopic beat burden.  And occasional short runs of nonsustained ventricular tachycardia longest lasting 6 beats.  He denies any orthopnea, pedal edema. Denies any fever or  chills. Denies any significant change in diet.  He does not smoke, quit over 10 years ago. Drinks 1 or 2 cups of coffee in the morning. Drinks 2 bottles of 16 ounces diet [address] a day. Does not consume alcohol. No recreational drug use. Occasionally consumes sweet tea.  Good compliance with medications at home including carvedilol, Farxiga, digoxin, Entresto, spironolactone. Continues on aspirin 81 mg once daily and Zocor 40 mg once daily.  Using nebulizer regularly.  EKG in the clinic today shows sinus rhythm heart rate 93/min, isolated ventricular ectopic beat, QRS wide with RBBB plus LAFB morphology duration 150 ms.  No significant change in comparison to prior EKG from 06/18/2023 [bifascicular block observed since 2017]  Last echocardiogram available for review January 2024 noted LVEF 35% with hypokinetic lateral and inferior wall segments, RV mildly dilated with mildly reduced function, mildly dilated right atrium, trace Mark, trace AI.  Last stress test with nuclear imaging January 2022 noted high risk study with large fixed perfusion defect, but no ischemia.  Lab work recently from 06-17-2023 noted total cholesterol 132, triglycerides 419, HDL 27, LDL 63. Hemoglobin  A1c 7.1 Sodium 138, potassium 5, BUN 19, creatinine 1.27, EGFR 60 Normal transaminases and alkaline phosphatase, total bilirubin mildly elevated at 1.3 CBC with hemoglobin 17.1, hematocrit 49.7, WBC 7.2, platelets 173.   Past Medical History:  Diagnosis Date   Arthritis    CAD (coronary artery disease)    Multivessel status post CABG 2010 (LIMA to LAD, SVG to OM1, SVG to PDA - Dr. Dorris Fetch)   Carotid artery disease [facility])    Cervical disc disease    COPD (chronic obstructive pulmonary disease) (HCC)    Erectile dysfunction    Essential hypertension    History of kidney stones        History of stroke 2010   Hyperlipidemia    Hypertension    Pneumothorax    Traumatic diaphragmatic hernia    Repaired  07/01/2014   Type 2 diabetes mellitus (HCC)     Past Surgical History:  Procedure Laterality Date   CAROTID ARTERY ANGIOPLASTY Left    2007   CORONARY ARTERY BYPASS GRAFT     x3   HEMORROIDECTOMY     INGUINAL HERNIA REPAIR Right 08/31/2014   Procedure: HERNIA REPAIR INGUINAL ADULT;  Surgeon: Dalia Heading, MD;  Location: [facility];  Service: General;  Laterality: Right;   INGUINAL HERNIA REPAIR Left 06/12/2016   Procedure: HERNIA REPAIR INGUINAL ADULT WITH MESH;  Surgeon: Franky Macho, MD;  Location: [facility];  Service: General;  Laterality: Left;   INSERTION OF MESH Right 08/31/2014   Procedure: INSERTION OF MESH;  Surgeon: Dalia Heading, MD;  Location: [facility];  Service: General;  Laterality: Right;  inguinal hernia   UMBILICAL HERNIA REPAIR N/A 08/31/2014   Procedure: HERNIA REPAIR UMBILICAL ADULT ;  Surgeon: Dalia Heading, MD;  Location: [facility];  Service: General;  Laterality: N/A;   VIDEO ASSISTED THORACOSCOPY (VATS)/THOROCOTOMY Left 07/01/2014   Procedure: with repair left diaphragmatic hernia ;  Surgeon: Loreli Slot, MD;  Location: [facility] OR;  Service: Thoracic;  Laterality: Left;   VIDEO BRONCHOSCOPY N/A 07/01/2014   Procedure: VIDEO BRONCHOSCOPY;  Surgeon: Loreli Slot, MD;  Location: [facility] OR;  Service: Thoracic;  Laterality: N/A;    Current Medications: Current Meds  Medication Sig   acetaminophen (TYLENOL) 500 MG tablet Take 1,000 mg by mouth every 12 (twelve) hours as needed for moderate pain (pain score 4-6) or mild pain (pain score 1-3).   albuterol (PROVENTIL HFA;VENTOLIN HFA) 108 (90 BASE) MCG/ACT inhaler Inhale 2 puffs into the lungs every 6 (six) hours as needed for wheezing or shortness of breath.   albuterol (PROVENTIL) (2.5 MG/3ML) 0.083% nebulizer solution Take 2.5 mg by nebulization every 4 (four) hours as needed for wheezing.   aspirin EC 81 MG tablet Take 1 tablet (81 mg total) by mouth daily.   carvedilol (COREG) 25 MG tablet Take 1 tablet (25 mg total)  by mouth 2 (two) times daily.   dapagliflozin propanediol (FARXIGA) 10 MG TABS tablet TAKE 1 TABLET BY MOUTH EVERY DAY BEFORE BREAKFAST (Patient taking differently: Take 10 mg by mouth daily.)   digoxin (LANOXIN) 0.125 MG tablet TAKE 1 TABLET BY MOUTH EVERY DAY   furosemide (LASIX) 20 MG tablet Take 1 tablet (20 mg total) by mouth every other day.   ipratropium (ATROVENT) 0.02 % nebulizer solution Take 500 mcg by nebulization every 4 (four) hours as needed for wheezing.   metFORMIN (GLUCOPHAGE) 500 MG tablet Take 500 mg by mouth 2 (two) times daily with a meal. Patient takes 1000  mg in the morning and 500 mg at supper   sacubitril-valsartan (ENTRESTO) 24-26 MG Take 1 tablet by mouth 2 (two) times daily.   simvastatin (ZOCOR) 40 MG tablet Take 40 mg by mouth every evening.   spironolactone (ALDACTONE) 25 MG tablet Take 0.5 tablets (12.5 mg total) by mouth daily.   tamsulosin (FLOMAX) 0.4 MG CAPS capsule Take 0.4 mg by mouth daily.   [DISCONTINUED] furosemide (LASIX) 20 MG tablet Take 1 tablet (20 mg total) by mouth daily.     Allergies:   Prednisone   Social History   Socioeconomic History   Marital status: Married    Spouse name: Not on file   Number of children: Not on file   Years of education: Not on file   Highest education level: Not on file  Occupational History   Not on file  Tobacco Use   Smoking status: Former    Current packs/day: 0.00    Average packs/day: 0.5 packs/day for 48.0 years (24.0 ttl pk-yrs)    Types: Cigarettes    Start date: 06/23/1970    Quit date: 06/23/2018    Years since quitting: 5.0   Smokeless tobacco: Never  Vaping Use   Vaping status: Never Used  Substance and Sexual Activity   Alcohol use: No   Drug use: No   Sexual activity: Yes  Other Topics Concern   Not on file  Social History Narrative   Not on file   Social Determinants of Health   Financial Resource Strain: Not on file  Food Insecurity: Low Risk  (03/12/2023)   Received from  [facility]   Hunger Vital Sign    Worried About Running Out of Food in the Last Year: Never true    Ran Out of Food in the Last Year: Never true  Transportation Needs: Not on file (03/12/2023)  Physical Activity: Not on file  Stress: Not on file  Social Connections: Not on file     Family History: The patient's family history includes Coronary artery disease in his father. There is no history of Colon cancer. ROS:   Please see the history of present illness.    All 14 point review of systems negative except as described per history of present illness.  EKGs/Labs/Other Studies Reviewed:    The following studies were reviewed today:   EKG:  EKG Interpretation Date/Time:  Tuesday July 08 2023 13:04:10 EST Ventricular Rate:  93 PR Interval:  182 QRS Duration:  150 QT Interval:  386 QTC Calculation: 479 R Axis:   -88  Text Interpretation: Sinus rhythm with occasional Premature ventricular complexes Right bundle branch block Left anterior fascicular block Bifascicular block Abnormal ECG When compared with ECG of 18-Jun-2023 09:49, Premature ventricular complexes are now Present T wave inversion less evident in Anterior leads Confirmed by Huntley Dec reddy [phone]) on 07/08/2023 1:10:56 PM    Recent Labs: No results found for requested labs within last 365 days.  Recent Lipid Panel    Component Value Date/Time   CHOL 124 02/25/2022 1543   TRIG 506 (H) 02/25/2022 1543   HDL 24 (L) 02/25/2022 1543   CHOLHDL 5.2 02/25/2022 1543   VLDL UNABLE TO CALCULATE IF TRIGLYCERIDE OVER 400 mg/dL 29/56/2130 8657   LDLCALC UNABLE TO CALCULATE IF TRIGLYCERIDE OVER 400 mg/dL 84/69/6295 2841   LDLDIRECT 51.3 02/25/2022 1543    Physical Exam:    VS:  BP 110/60 (BP Location: Left Arm, Patient Position: Sitting)   Pulse 93   Ht 5'  7" (1.702 m)   Wt 169 lb 3.2 oz (76.7 kg)   SpO2 95%   BMI 26.50 kg/m     Wt Readings from Last 3 Encounters:  07/08/23 169 lb 3.2 oz (76.7 kg)   06/18/23 170 lb (77.1 kg)  08/09/22 168 lb (76.2 kg)     GENERAL:  Well nourished, well developed in no acute distress NECK: No JVD; No carotid bruits CARDIAC: RRR, S1 and S2 present, no murmurs, no rubs, no gallops CHEST:  Clear to auscultation without rales, wheezing or rhonchi  Extremities: No pitting pedal edema. Pulses bilaterally symmetric with radial 2+ and dorsalis pedis 2+ NEUROLOGIC:  Alert and oriented x 3  Medication Adjustments/Labs and Tests Ordered: Current medicines are reviewed at length with the patient today.  Concerns regarding medicines are outlined above.  Orders Placed This Encounter  Procedures   NM PET CT CARDIAC PERFUSION MULTI W/ABSOLUTE BLOODFLOW   Comp Met (CMET)   Magnesium   Pro b natriuretic peptide (BNP)   CBC   EKG 12-Lead   ECHOCARDIOGRAM COMPLETE   Meds ordered this encounter  Medications   DISCONTD: furosemide (LASIX) 20 MG tablet    Sig: Take 1 tablet (20 mg total) by mouth daily.    Dispense:  90 tablet    Refill:  3   furosemide (LASIX) 20 MG tablet    Sig: Take 1 tablet (20 mg total) by mouth every other day.    Dispense:  45 tablet    Refill:  3    Signed, Wyllow Seigler reddy Elijah Michaelis, MD, MPH, [facility]. 07/08/2023 1:59 PM    [facility]

## 2023-07-08 NOTE — Telephone Encounter (Signed)
Contacted by [facility] staff on 07/07/2023 and scheduled to see Madireddy 07/08/2023 @1 :00 pm at the [facility] office.

## 2023-07-08 NOTE — Assessment & Plan Note (Signed)
Does not acutely appear volume overloaded.  Advised to continue dietary sodium restriction to below 2 g/day and fluid restriction to below 2 L/day. Advised to avoid stimulants such as [facility] and abstain from it. Consume water. 1 of 2 cups of coffee a day is okay.  Continue with goal-directed medical therapy with carvedilol 25 mg twice daily, Farxiga 10 mg twice daily, Entresto 24 mg / 26 mg twice daily, spironolactone 25 mg once daily. He has been continued on digoxin long-term 0.125 mg once a day, continue this for now.

## 2023-07-09 LAB — COMPREHENSIVE METABOLIC PANEL
ALT: 42 [IU]/L (ref 0–44)
AST: 23 [IU]/L (ref 0–40)
Albumin: 4.3 g/dL (ref 3.8–4.8)
Alkaline Phosphatase: 55 [IU]/L (ref 44–121)
BUN/Creatinine Ratio: 21 (ref 10–24)
BUN: 23 mg/dL (ref 8–27)
Bilirubin Total: 1 mg/dL (ref 0.0–1.2)
CO2: 20 mmol/L (ref 20–29)
Calcium: 9.6 mg/dL (ref 8.6–10.2)
Chloride: 95 mmol/L — ABNORMAL LOW (ref 96–106)
Creatinine, Ser: 1.12 mg/dL (ref 0.76–1.27)
Globulin, Total: 2.7 g/dL (ref 1.5–4.5)
Glucose: 172 mg/dL — ABNORMAL HIGH (ref 70–99)
Potassium: 4.3 mmol/L (ref 3.5–5.2)
Sodium: 135 mmol/L (ref 134–144)
Total Protein: 7 g/dL (ref 6.0–8.5)
eGFR: 70 mL/min/{1.73_m2} (ref >59)

## 2023-07-09 LAB — CBC
Hematocrit: 49.8 % (ref 37.5–51.0)
Hemoglobin: 16.2 g/dL (ref 13.0–17.7)
MCH: 31.4 pg (ref 26.6–33.0)
MCHC: 32.5 g/dL (ref 31.5–35.7)
MCV: 97 fL (ref 79–97)
Platelets: 209 10*3/uL (ref 150–450)
RBC: 5.16 x10E6/uL (ref 4.14–5.80)
RDW: 12.7 % (ref 11.6–15.4)
WBC: 6.2 10*3/uL (ref 3.4–10.8)

## 2023-07-09 LAB — PRO B NATRIURETIC PEPTIDE: NT-Pro BNP: 267 pg/mL (ref 0–376)

## 2023-07-09 LAB — MAGNESIUM: Magnesium: 1.5 mg/dL — ABNORMAL LOW (ref 1.6–2.3)

## 2023-07-28 ENCOUNTER — Ambulatory Visit: Payer: Medicare HMO

## 2023-07-28 DIAGNOSIS — R0609 Other forms of dyspnea: Secondary | ICD-10-CM

## 2023-07-28 LAB — ECHOCARDIOGRAM COMPLETE
Calc EF: 36.5 %
S' Lateral: 4.6 cm
Single Plane A2C EF: 34.2 %
Single Plane A4C EF: 38.8 %

## 2023-07-31 ENCOUNTER — Ambulatory Visit: Payer: PPO | Admitting: Nurse Practitioner

## 2023-08-07 ENCOUNTER — Ambulatory Visit: Payer: Medicare HMO

## 2023-08-22 ENCOUNTER — Other Ambulatory Visit (HOSPITAL_COMMUNITY): Payer: Self-pay | Admitting: *Deleted

## 2023-08-22 ENCOUNTER — Encounter (HOSPITAL_COMMUNITY): Payer: Self-pay

## 2023-08-22 DIAGNOSIS — I2089 Other forms of angina pectoris: Secondary | ICD-10-CM

## 2023-08-22 DIAGNOSIS — I209 Angina pectoris, unspecified: Secondary | ICD-10-CM

## 2023-08-22 DIAGNOSIS — R0609 Other forms of dyspnea: Secondary | ICD-10-CM

## 2023-08-26 ENCOUNTER — Encounter (HOSPITAL_COMMUNITY)
Admission: RE | Admit: 2023-08-26 | Discharge: 2023-08-26 | Disposition: A | Discharge status: Home / Self Care | Payer: HMO | Source: Ambulatory Visit

## 2023-08-26 DIAGNOSIS — R0609 Other forms of dyspnea: Secondary | ICD-10-CM | POA: Diagnosis not present

## 2023-08-26 LAB — NM PET CT CARDIAC PERFUSION MULTI W/ABSOLUTE BLOODFLOW
LV dias vol: 170 mL (ref 62–150)
Nuc Rest EF: 27 %
Nuc Stress EF: 24 %
Peak HR: 100 {beats}/min
Rest HR: 70 {beats}/min
Rest Nuclear Isotope Dose: 19.7 mCi
Rest perfusion cavity size (mL): 170 mL
ST Depression (mm): 0 mm
Stress Nuclear Isotope Dose: 20.1 mCi
Stress perfusion cavity size (mL): 205 mL
TID: 1.21

## 2023-08-26 MED ORDER — REGADENOSON 0.4 MG/5ML IV SOLN
0.4000 mg | Freq: Once | INTRAVENOUS | Status: AC
Start: 2023-08-26 — End: 2023-08-26
  Administered 2023-08-26: 0.4 mg via INTRAVENOUS

## 2023-08-26 MED ORDER — REGADENOSON 0.4 MG/5ML IV SOLN
INTRAVENOUS | Status: AC
Start: 2023-08-26 — End: ?
  Filled 2023-08-26: qty 5

## 2023-08-26 MED ORDER — RUBIDIUM RB82 GENERATOR (RUBYFILL)
20.1300 | PACK | Freq: Once | INTRAVENOUS | Status: AC
  Administered 2023-08-26: 20.13 via INTRAVENOUS

## 2023-08-26 MED ORDER — RUBIDIUM RB82 GENERATOR (RUBYFILL)
19.7600 | PACK | Freq: Once | INTRAVENOUS | Status: AC
  Administered 2023-08-26: 19.76 via INTRAVENOUS

## 2023-08-27 DIAGNOSIS — I340 Nonrheumatic mitral (valve) insufficiency: Secondary | ICD-10-CM | POA: Insufficient documentation

## 2023-08-27 HISTORY — DX: Nonrheumatic mitral (valve) insufficiency: I34.0

## 2023-09-02 DIAGNOSIS — J939 Pneumothorax, unspecified: Secondary | ICD-10-CM | POA: Insufficient documentation

## 2023-09-02 DIAGNOSIS — I10 Essential (primary) hypertension: Secondary | ICD-10-CM | POA: Insufficient documentation

## 2023-09-02 DIAGNOSIS — I779 Disorder of arteries and arterioles, unspecified: Secondary | ICD-10-CM | POA: Insufficient documentation

## 2023-09-02 DIAGNOSIS — E119 Type 2 diabetes mellitus without complications: Secondary | ICD-10-CM | POA: Insufficient documentation

## 2023-09-02 DIAGNOSIS — M5090 Cervical disc disorder, unspecified, unspecified cervical region: Secondary | ICD-10-CM | POA: Insufficient documentation

## 2023-09-02 DIAGNOSIS — Z87442 Personal history of urinary calculi: Secondary | ICD-10-CM | POA: Insufficient documentation

## 2023-09-02 DIAGNOSIS — N529 Male erectile dysfunction, unspecified: Secondary | ICD-10-CM | POA: Insufficient documentation

## 2023-09-02 DIAGNOSIS — M1990 Unspecified osteoarthritis, unspecified site: Secondary | ICD-10-CM | POA: Insufficient documentation

## 2023-09-03 ENCOUNTER — Ambulatory Visit: Payer: HMO

## 2023-09-08 ENCOUNTER — Other Ambulatory Visit: Payer: Self-pay | Admitting: Cardiology

## 2023-09-12 ENCOUNTER — Ambulatory Visit: Payer: HMO

## 2023-09-12 VITALS — BP 108/58 | HR 75 | Ht 67.0 in | Wt 169.2 lb

## 2023-09-12 DIAGNOSIS — I25708 Atherosclerosis of coronary artery bypass graft(s), unspecified, with other forms of angina pectoris: Secondary | ICD-10-CM

## 2023-09-12 DIAGNOSIS — I5022 Chronic systolic (congestive) heart failure: Secondary | ICD-10-CM

## 2023-09-12 NOTE — Assessment & Plan Note (Signed)
CHF; - Cardiomyopathy reduced LVEF 25 to 30% initially diagnosed May 2010 at the time of his initial stroke -IntraOp TEE 04-06-2009 LVEF 25 to 30%, no LV clot - Echocardiogram in November 2015 LVEF 55 to 60% - Repeat echocardiogram December 2021 LVEF decreased to 20 to 25% - Echocardiogram August 2022 with LVEF improved slightly to 40 to 45% - echocardiogram January 2024 with LVEF reported 35%, felt relatively similar to prior echocardiogram and was continued on medical therapy. - Echocardiogram 07/28/2023 LVEF 35 to 40%, global hypokinesis in description, RV function mildly reduced, mild MR   Lab in the setting he appears euvolemic, compensated, NYHA class II. Good compliance with his current guideline directed medical therapy with carvedilol 25 mg twice daily Farxiga 10 mg daily Entresto 24 mg / 26 mg twice daily Spironolactone 25 mg once daily

## 2023-09-12 NOTE — Patient Instructions (Signed)
Medication Instructions:  Your physician recommends that you continue on your current medications as directed. Please refer to the Current Medication list given to you today.  *If you need a refill on your cardiac medications before your next appointment, please call your pharmacy*   Lab Work: None Ordered If you have labs (blood work) drawn today and your tests are completely normal, you will receive your results only by: MyChart Message (if you have MyChart) OR A paper copy in the mail If you have any lab test that is abnormal or we need to change your treatment, we will call you to review the results.   Testing/Procedures: None Ordered   Follow-Up: At [facility], you and your health needs are our priority.  As part of our continuing mission to provide you with exceptional heart care, we have created designated Provider Care Teams.  These Care Teams include your primary Cardiologist (physician) and Advanced Practice Providers (APPs -  Physician Assistants and Nurse Practitioners) who all work together to provide you with the care you need, when you need it.  We recommend signing up for the patient portal called "MyChart".  Sign up information is provided on this After Visit Summary.  MyChart is used to connect with patients for Virtual Visits (Telemedicine).  Patients are able to view lab/test results, encounter notes, upcoming appointments, etc.  Non-urgent messages can be sent to your provider as well.   To learn more about what you can do with MyChart, go to [email or web address].    Your next appointment:   3 MONTH FOLLOW UP

## 2023-09-12 NOTE — Progress Notes (Signed)
Cardiology Consultation:    Date:  09/12/2023   ID:  Mark Rios, DOB 05-07-1951, MRN [id number]  PCP:  Delmer Islam, NP-C  Cardiologist:  Marlyn Corporal Lynleigh Kovack, MD   Referring MD: Delmer Islam, NP-C   No chief complaint on file.    ASSESSMENT AND PLAN:   Mr. 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 With improved symptoms he wishes to hold off on further invasive workup and wants to follow-up conservatively.  Problem List Items Addressed This Visit     CAD (coronary artery disease), 3vd on cath 12/2008; s/p CABG (LIMA-LAD, SVG-OM1, SVG-PDA 03/2009); last stress Cardiac PET 08/2023 abnormal with ischemia LCx and Diag territory - Primary   Coronary artery disease; - Triple-vessel disease on cardiac cath May 2010, identified in the setting of workup after left-sided stroke secondary to left internal carotid artery disease - Revascularization delayed until left internal carotid artery stenting. -CABG three-vessel LIMA-LAD, SVG-PDA, SVG-OM1 August 2010 -Stress test with nuclear imaging January 2022 in the setting of drop in LVEF reported high risk study with reduced LVEF, prior MI findings, no ischemia was reported.  He was continued on medical therapy. - Cardiac PET stress completed January 2025 in the setting of reduced LVEF and symptoms  of chest pressure and shortness of breath with exertion, showed high risk findings with ischemia in LCx and diagonal territories.  He however reports that this time his symptoms have improved over the past couple months.  Initially symptoms were associated with his acute COPD exacerbation and persistent for few weeks after that.  In this context we further reviewed evaluation with cardiac catheterization. He wishes to hold off on any further invasive testing as he feels better. I think this is reasonable so long as we can continue to monitor him closely. Discussed with them extensively if he has any symptoms of worsening shortness of breath or has any chest pain or any other symptoms suggestive of angina such as decreasing effort tolerance or increased weakness he should let us know promptly.  He is agreeable to this plan.  Continue aspirin 81 mg once daily Continue simvastatin 40 mg once daily. Continue antianginal regimen and with his carvedilol that he is on 25 mg twice daily   He is aware to go to the ER if his symptoms are acutely worsening at any time.  Will have him scheduled for follow-up in 3 months.      Relevant Medications   sildenafil (VIAGRA) 100 MG tablet   Other Relevant Orders   EKG 12-Lead (Completed)   CHF (congestive heart failure) (HCC), ischemic cardiomyopathy, reduced LVEF 35-40% by TTE 07/2023, NYHA class II   CHF; - Cardiomyopathy reduced LVEF 25 to 30% initially diagnosed May 2010 at the time of his initial stroke -IntraOp TEE 04-06-2009 LVEF 25 to 30%, no LV clot - Echocardiogram in November 2015 LVEF 55 to 60% - Repeat echocardiogram December 2021 LVEF decreased to 20  to 25% - Echocardiogram August 2022 with LVEF improved slightly to 40 to 45% - echocardiogram January 2024 with LVEF reported 35%, felt relatively similar to prior echocardiogram and was continued on medical therapy. - Echocardiogram 07/28/2023 LVEF 35 to 40%, global hypokinesis in description, RV  function mildly reduced, mild MR   Lab in the setting he appears euvolemic, compensated, NYHA class II. Good compliance with his current guideline directed medical therapy with carvedilol 25 mg twice daily Farxiga 10 mg daily Entresto 24 mg / 26 mg twice daily Spironolactone 25 mg once daily        Relevant Medications   sildenafil (VIAGRA) 100 MG tablet   Return to clinic in 3 months or as needed.   History of Present Illness:    Mark Rios is a 73 y.o. male who is being seen today for follow-up visit. Last visit in the office with as well as 07/08/2023. PCP is  Delmer Islam, NP-C.   Has a history of  CAD and CHF with reduced LVEF CVA, no residual neurological deficits, hypertension, dyslipidemia, severe hypertriglyceridemia, COPD, former smoker quit 10 years ago, carotid artery disease s/p left ICA stenting in May 2010, CKD stage II, traumatic diaphragmatic hernia repair in 2015  In November at his initial office visit seen for symptoms of dyspnea on exertion and occasional palpitations which he describes as fluttering and sensation of awareness of heartbeat when he lays on the side towards the end of the day.  The symptoms were in the setting of his then recent acute COPD exacerbation and recovery from those symptoms.  We requested an echocardiogram and cardiac PET/CT stress test to rule out any significant ischemia burden.  Echocardiogram July 28, 2023 noted LVEF 35 to 40%, mild mitral regurgitation.  Evaluated with cardiac PET stress test 08-26-2023 that showed small moderate reversible defect in the basal to mid anterior wall segments and a moderate-sized perfusion defect involving basal to apical lateral wall segments.  LVEF was noted to be 27% at rest and dropping to 24% on stress images.  Was reported as a high risk study.  In this context he is here for follow-up visit. Is here accompanied by his wife.  Mentions overall he feels his breathing is improved over  the past couple months.  Has not been doing significant physical exertion outdoors due to the cold but keeps himself busy in the house.  Denies any chest pain.  Breathing overall has improved over the past couple months and denies any orthopnea, lightheadedness, dizziness or syncopal episodes.  Continues to feel awareness of heartbeat felt irregular rhythm when he lays down to sleep on his sides.  Otherwise denies any other palpitations. Denies any pedal edema.  Denies any significant weight change.   EKG in the clinic today shows sinus rhythm with  PR interval normal 178 ms, QRS duration 158 ms with RBBB plus LAFB morphology.  Has been compliant with all his medications and taking them consistently.   Past Medical History:  Diagnosis Date   Arthritis    Benign prostatic hyperplasia with urinary frequency 03/12/2023   CAD (coronary artery disease)    Multivessel status post CABG 2010 (LIMA to LAD, SVG to OM1, SVG to PDA - Dr. Dorris Fetch)   Carotid artery disease [facility])    Cervical disc disease    CHF (congestive heart failure) (HCC), ischemic cardiomyopathy, reduced LVEF 35-40% by TTE 07/2023, NYHA class II 07/08/2023   CHF;  - Cardiomyopathy reduced LVEF 25 to 30%  initially diagnosed May 2010 at the time of his initial stroke  -IntraOp TEE 04-06-2009 LVEF 25 to 30%, no LV clot  - Echocardiogram in November 2015 LVEF 55 to 60%  - Repeat echocardiogram December 2021 LVEF decreased to 20 to 25%  - Echocardiogram August 2022 with LVEF improved slightly to 40 to 45%  - echocardiogram January 2024 with LVEF reported 35   COPD (chronic obstructive pulmonary disease) (HCC)    Diaphragmatic hernia without obstruction 06/29/2014   Repaired 07/01/2014     Diverticulitis of colon 06/21/2022   Dyspnea on exertion 07/08/2023   Erectile dysfunction    Essential hypertension    History of kidney stones        History of stroke 2010   HLD (hyperlipidemia) 06/26/2018   HTN (hypertension) 06/26/2018    Hypoxia 06/26/2018   Immunosuppression due to chronic steroid use (HCC) 06/26/2018   Mild mitral regurgitation, by echocardiogram December 2024 08/27/2023   NASH (nonalcoholic steatohepatitis) 03/12/2023   Obstructive sleep apnea syndrome 06/21/2023   Overweight (BMI 25.0-29.9) 06/21/2023   Pneumothorax    Rectal bleeding 06/21/2022   S/P CABG (coronary artery bypass graft) 06/26/2018   Sepsis (HCC) 06/26/2018   Traumatic diaphragmatic hernia    Repaired 07/01/2014   Type 2 diabetes mellitus (HCC)    Type 2 diabetes mellitus without complication, without long-term current use of insulin (HCC) 03/12/2023    Past Surgical History:  Procedure Laterality Date   CAROTID ARTERY ANGIOPLASTY Left    2007   CORONARY ARTERY BYPASS GRAFT     x3   HEMORROIDECTOMY     INGUINAL HERNIA REPAIR Right 08/31/2014   Procedure: HERNIA REPAIR INGUINAL ADULT;  Surgeon: Dalia Heading, MD;  Location: [facility];  Service: General;  Laterality: Right;   INGUINAL HERNIA REPAIR Left 06/12/2016   Procedure: HERNIA REPAIR INGUINAL ADULT WITH MESH;  Surgeon: Franky Macho, MD;  Location: [facility];  Service: General;  Laterality: Left;   INSERTION OF MESH Right 08/31/2014   Procedure: INSERTION OF MESH;  Surgeon: Dalia Heading, MD;  Location: [facility];  Service: General;  Laterality: Right;  inguinal hernia   UMBILICAL HERNIA REPAIR N/A 08/31/2014   Procedure: HERNIA REPAIR UMBILICAL ADULT ;  Surgeon: Dalia Heading, MD;  Location: [facility];  Service: General;  Laterality: N/A;   VIDEO ASSISTED THORACOSCOPY (VATS)/THOROCOTOMY Left 07/01/2014   Procedure: with repair left diaphragmatic hernia ;  Surgeon: Loreli Slot, MD;  Location[facility];  Service: Thoracic;  Laterality: Left;   VIDEO BRONCHOSCOPY N/A 07/01/2014   Procedure: VIDEO BRONCHOSCOPY;  Surgeon: Loreli Slot, MD;  Location: [facility] OR;  Service: Thoracic;  Laterality: N/A;    Current Medications: Current Meds  Medication Sig   acetaminophen (TYLENOL)  500 MG tablet Take 1,000 mg by mouth every 12 (twelve) hours as needed for moderate pain (pain score 4-6) or mild pain (pain score 1-3).   albuterol (PROVENTIL HFA;VENTOLIN HFA) 108 (90 BASE) MCG/ACT inhaler Inhale 2 puffs into the lungs every 6 (six) hours as needed for wheezing or shortness of breath.   albuterol (PROVENTIL) (2.5 MG/3ML) 0.083% nebulizer solution Take 2.5 mg by nebulization every 4 (four) hours as needed for wheezing.   aspirin EC 81 MG tablet Take 1 tablet (81 mg total) by mouth daily.   carvedilol (COREG) 25 MG tablet TAKE 1 TABLET BY MOUTH TWICE A DAY   dapagliflozin propanediol (FARXIGA) 10 MG TABS tablet TAKE 1 TABLET BY MOUTH EVERY DAY BEFORE BREAKFAST  digoxin (LANOXIN) 0.125 MG tablet TAKE 1 TABLET BY MOUTH EVERY DAY   ipratropium (ATROVENT) 0.02 % nebulizer solution Take 500 mcg by nebulization every 4 (four) hours as needed for wheezing.   metFORMIN (GLUCOPHAGE) 500 MG tablet Take 500 mg by mouth 2 (two) times daily with a meal. Patient takes 500 mg in the morning and 1000 mg at supper   sacubitril-valsartan (ENTRESTO) 24-26 MG Take 1 tablet by mouth 2 (two) times daily.   sildenafil (VIAGRA) 100 MG tablet Take 100 mg by mouth as needed for erectile dysfunction.   simvastatin (ZOCOR) 40 MG tablet Take 40 mg by mouth every evening.   spironolactone (ALDACTONE) 25 MG tablet Take 0.5 tablets (12.5 mg total) by mouth daily.   tamsulosin (FLOMAX) 0.4 MG CAPS capsule Take 0.4 mg by mouth daily.     Allergies:   Prednisone   Social History   Socioeconomic History   Marital status: Married    Spouse name: Not on file   Number of children: Not on file   Years of education: Not on file   Highest education level: Not on file  Occupational History   Not on file  Tobacco Use   Smoking status: Former    Current packs/day: 0.00    Average packs/day: 0.5 packs/day for 48.0 years (24.0 ttl pk-yrs)    Types: Cigarettes    Start date: 06/23/1970    Quit date: 06/23/2018     Years since quitting: 5.2   Smokeless tobacco: Never  Vaping Use   Vaping status: Never Used  Substance and Sexual Activity   Alcohol use: No   Drug use: No   Sexual activity: Yes  Other Topics Concern   Not on file  Social History Narrative   Not on file   Social Drivers of Health   Financial Resource Strain: Not on file  Food Insecurity: Low Risk  (03/12/2023)   Received from [facility]   Hunger Vital Sign    Worried About Running Out of Food in the Last Year: Never true    Ran Out of Food in the Last Year: Never true  Transportation Needs: Not on file (03/12/2023)  Physical Activity: Not on file  Stress: Not on file  Social Connections: Not on file     Family History: The patient's family history includes Coronary artery disease in his father. There is no history of Colon cancer. ROS:   Please see the history of present illness.    All 14 point review of systems negative except as described per history of present illness.  EKGs/Labs/Other Studies Reviewed:    The following studies were reviewed today:   EKG:  EKG Interpretation Date/Time:  Friday September 12 2023 13:19:16 EST Ventricular Rate:  75 PR Interval:  178 QRS Duration:  158 QT Interval:  392 QTC Calculation: 437 R Axis:   -86  Text Interpretation: Normal sinus rhythm Right bundle branch block Left anterior fascicular block Bifascicular block T wave abnormality, consider lateral ischemia Abnormal ECG When compared with ECG of 08-Jul-2023 13:04, Premature ventricular complexes are no longer Present T wave inversion more evident in Anterior leads Confirmed by Huntley Dec reddy [phone]) on 09/12/2023 1:47:59 PM    Recent Labs: 07/08/2023: ALT 42; BUN 23; Creatinine, Ser 1.12; Hemoglobin 16.2; Magnesium 1.5; NT-Pro BNP 267; Platelets 209; Potassium 4.3; Sodium 135  Recent Lipid Panel    Component Value Date/Time   CHOL 124 02/25/2022 1543   TRIG 506 (H) 02/25/2022 1543   HDL  24 (L) 02/25/2022 1543    CHOLHDL 5.2 02/25/2022 1543   VLDL UNABLE TO CALCULATE IF TRIGLYCERIDE OVER 400 mg/dL 16/05/9603 5409   LDLCALC UNABLE TO CALCULATE IF TRIGLYCERIDE OVER 400 mg/dL 81/19/1478 2956   LDLDIRECT 51.3 02/25/2022 1543    Physical Exam:    VS:  BP (!) 108/58   Pulse 75   Ht 5\' 7"  (1.702 m)   Wt 169 lb 3.2 oz (76.7 kg)   SpO2 93%   BMI 26.50 kg/m     Wt Readings from Last 3 Encounters:  09/12/23 169 lb 3.2 oz (76.7 kg)  07/08/23 169 lb 3.2 oz (76.7 kg)  06/18/23 170 lb (77.1 kg)     GENERAL:  Well nourished, well developed in no acute distress NECK: No JVD; No carotid bruits CARDIAC: RRR, S1 and S2 present, no murmurs, no rubs, no gallops CHEST:  Clear to auscultation without rales, wheezing or rhonchi  Extremities: No pitting pedal edema. Pulses bilaterally symmetric with radial 2+ and dorsalis pedis 2+ NEUROLOGIC:  Alert and oriented x 3  Medication Adjustments/Labs and Tests Ordered: Current medicines are reviewed at length with the patient today.  Concerns regarding medicines are outlined above.  Orders Placed This Encounter  Procedures   EKG 12-Lead   No orders of the defined types were placed in this encounter.   Signed, Cecille Amsterdam, MD, MPH, [facility]. 09/12/2023 2:06 PM    [facility]

## 2023-09-12 NOTE — Assessment & Plan Note (Signed)
Coronary artery disease; - Triple-vessel disease on cardiac cath May 2010, identified in the setting of workup after left-sided stroke secondary to left internal carotid artery disease - Revascularization delayed until left internal carotid artery stenting. -CABG three-vessel LIMA-LAD, SVG-PDA, SVG-OM1 August 2010 -Stress test with nuclear imaging January 2022 in the setting of drop in LVEF reported high risk study with reduced LVEF, prior MI findings, no ischemia was reported.  He was continued on medical therapy. - Cardiac PET stress completed January 2025 in the setting of reduced LVEF and symptoms of chest pressure and shortness of breath with exertion, showed high risk findings with ischemia in LCx and diagonal territories.  He however reports that this time his symptoms have improved over the past couple months.  Initially symptoms were associated with his acute COPD exacerbation and persistent for few weeks after that.  In this context we further reviewed evaluation with cardiac catheterization. He wishes to hold off on any further invasive testing as he feels better. I think this is reasonable so long as we can continue to monitor him closely. Discussed with them extensively if he has any symptoms of worsening shortness of breath or has any chest pain or any other symptoms suggestive of angina such as decreasing effort tolerance or increased weakness he should let us know promptly.  He is agreeable to this plan.  Continue aspirin 81 mg once daily Continue simvastatin 40 mg once daily. Continue antianginal regimen and with his carvedilol that he is on 25 mg twice daily   He is aware to go to the ER if his symptoms are acutely worsening at any time.  Will have him scheduled for follow-up in 3 months.

## 2023-12-17 ENCOUNTER — Other Ambulatory Visit (HOSPITAL_COMMUNITY): Payer: PPO

## 2023-12-19 ENCOUNTER — Ambulatory Visit: Payer: HMO

## 2023-12-24 ENCOUNTER — Telehealth: Payer: Self-pay | Admitting: Pharmacy Technician

## 2023-12-24 MED ORDER — DAPAGLIFLOZIN PROPANEDIOL 10 MG PO TABS: 10.0000 mg | ORAL_TABLET | Freq: Every day | ORAL | 3 refills | Status: DC

## 2023-12-24 NOTE — Telephone Encounter (Signed)
 Refill request completed.

## 2023-12-24 NOTE — Telephone Encounter (Signed)
 Received refill request from [facility] for farxiga . I scanned a blank refill form in media under "[facility] REFILL REQUEST FARXIGA " if someone can send in a refill for this patient please and thank you!

## 2023-12-26 ENCOUNTER — Ambulatory Visit: Payer: Self-pay | Attending: Cardiology

## 2023-12-26 DIAGNOSIS — I5022 Chronic systolic (congestive) heart failure: Secondary | ICD-10-CM | POA: Diagnosis not present

## 2023-12-26 LAB — ECHOCARDIOGRAM COMPLETE
AR max vel: 2.95 cm2
AV Area VTI: 2.9 cm2
AV Area mean vel: 3.28 cm2
AV Mean grad: 1.7 mmHg
AV Peak grad: 4 mmHg
Ao pk vel: 0.99 m/s
Area-P 1/2: 4.96 cm2
Calc EF: 33.3 %
MV M vel: 4.84 m/s
MV Peak grad: 93.7 mmHg
S' Lateral: 5.2 cm
Single Plane A2C EF: 34 %
Single Plane A4C EF: 33.3 %

## 2023-12-30 ENCOUNTER — Ambulatory Visit

## 2023-12-30 VITALS — BP 112/48 | HR 94 | Ht 67.0 in | Wt 159.6 lb

## 2023-12-30 DIAGNOSIS — I25708 Atherosclerosis of coronary artery bypass graft(s), unspecified, with other forms of angina pectoris: Secondary | ICD-10-CM

## 2023-12-30 DIAGNOSIS — I5022 Chronic systolic (congestive) heart failure: Secondary | ICD-10-CM

## 2023-12-30 NOTE — Progress Notes (Signed)
 Cardiology Consultation:    Date:  12/30/2023   ID:  Mark Rios, DOB 1951-03-14, MRN [id number]  PCP:  Warden Ha, NP-C  Cardiologist:  Daymon Evans Damico Partin, MD   Referring MD: Gerard Knight, MD   No chief complaint on file.    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 Cardiac PET/CT stress test January 2025 noted ischemia and small LAD territory and moderate LCx territory.  He preferred to hold off on further invasive workup and follow-up conservatively. Doing well and stable from cardiac standpoint.  Problem List Items Addressed This Visit     CAD (coronary artery disease), 3vd on cath 12/2008; s/p CABG (LIMA-LAD, SVG-OM1, SVG-PDA 03/2009); last stress Cardiac PET 08/2023 abnormal with ischemia LCx and Diag territory - Primary   Coronary artery disease; - Triple-vessel disease on cardiac cath May 2010, identified in the setting of workup after left-sided stroke secondary to left internal carotid artery disease - Revascularization delayed until left internal carotid artery stenting. -CABG three-vessel LIMA-LAD, SVG-PDA, SVG-OM1 August 2010 -Stress test with nuclear imaging January 2022 in the setting of drop in LVEF reported high risk study with reduced LVEF, prior MI findings, no ischemia was reported.  He was continued on medical therapy. - Cardiac PET stress completed January 2025 in the setting of reduced LVEF and symptoms of chest pressure and  shortness of breath with exertion, showed high risk findings with ischemia in LCx and diagonal territories  Functional status remains stable at this time. Continue with aspirin  81 mg once daily Continue simvastatin  40 mg once daily From antianginal medication standpoint he remains on carvedilol  25 mg twice daily.  If any symptoms of chest pain or shortness of breath he is aware to notify us  right away.      CHF (congestive heart failure) (HCC), ischemic cardiomyopathy, reduced LVEF 35 to% by TTE 12/2023, NYHA class II   CHF; - Cardiomyopathy reduced LVEF 25 to 30% initially diagnosed May 2010 at the time of his initial stroke -IntraOp TEE 04-06-2009 LVEF 25 to 30%, no LV clot - Echocardiogram in November 2015 LVEF 55 to 60% - Repeat echocardiogram December 2021 LVEF decreased to 20 to 25% - Echocardiogram August 2022 with LVEF improved slightly to 40 to 45% - echocardiogram January 2024 with LVEF reported 35%, felt relatively similar to prior echocardiogram and was continued on medical therapy. - Echocardiogram 07/28/2023 LVEF 35 to 40%, global hypokinesis in description, RV function mildly reduced, mild MR -Echocardiogram 12-26-2023 with reported LVEF 30 to 35%, grade 1 diastolic dysfunction, normal RV size and low normal function, RVSP 39 mmHg, mild MR  Appears euvolemic, compensated, NYHA class II. Has furosemide  available to use as needed but has not required it in many months.  Remains on low-dose digoxin  0.125 mg once a day.  Continues to show good compliance with his medications. Remains on carvedilol  25 mg twice daily Farxiga  10 mg once daily Entresto  24 mg / 26 mg twice daily Spironolactone  12.5 mg once daily. Recent blood work from 12-16-2023 creatinine  1.12 and EGFR 70. Potassium 5.1.        Return to clinic in 6 months.  Earlier follow-up as needed.   History of Present Illness:    Mark Rios is a 73 y.o. male who is being seen today for follow-up visit. PCP is  Gerard Knight, MD. Last visit with me in the office was 09-12-2023.  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 Cardiac PET/CT stress test January 2025 noted ischemia and small LAD territory and moderate LCx territory.  He preferred to hold off on further invasive workup and follow-up conservatively.  Echocardiogram now repeated 12-26-2023 reported LVEF 30 to 35% with grade 1 diastolic dysfunction, RV normal size with low normal function.  RVSP 39 mmHg, mild MR  Denies any chest pain. Exertional dyspnea is at baseline.  No significant changes. Continues to keep himself active with walking and working in his yard.  His gardening season he is set up some tomato plants. Denies any orthopnea or paroxysmal nocturnal dyspnea.  Weight has improved.  No pedal edema.  Mindful about salt intake.  Good compliance with his medications.  Blood work from 12-16-2023 BUN 20, creatinine 1.12, EGFR 70 Normal transaminases and alkaline phosphatase. Mildly elevated total bilirubin 1.3. Hemoglobin A1c 6. CBC unremarkable. Sodium 137, potassium 5.1.  Past Medical History:  Diagnosis Date   Arthritis    Benign prostatic hyperplasia with urinary frequency 03/12/2023   CAD (coronary artery disease)    Multivessel status post CABG 2010 (LIMA to LAD, SVG to OM1, SVG to PDA - Dr. Luna Salinas)   Carotid artery disease [facility])    Cervical disc disease    CHF (congestive heart failure) (HCC), ischemic cardiomyopathy, reduced LVEF 35-40% by TTE  07/2023, NYHA class II 07/08/2023   CHF;  - Cardiomyopathy reduced LVEF 25 to 30% initially diagnosed May 2010 at the time of his initial stroke  -IntraOp TEE 04-06-2009 LVEF 25 to 30%, no LV clot  - Echocardiogram in November 2015 LVEF 55 to 60%  - Repeat echocardiogram December 2021 LVEF decreased to 20 to 25%  - Echocardiogram August 2022 with LVEF improved slightly to 40 to 45%  - echocardiogram January 2024 with LVEF reported 35   COPD (chronic obstructive pulmonary disease) (HCC)    Diaphragmatic hernia without obstruction 06/29/2014   Repaired 07/01/2014     Diverticulitis of colon 06/21/2022   Dyspnea on exertion 07/08/2023   Erectile dysfunction    Essential hypertension    History of kidney stones        History of stroke 2010   HLD (hyperlipidemia) 06/26/2018   HTN (hypertension) 06/26/2018   Hypoxia 06/26/2018   Immunosuppression due to chronic steroid use (HCC) 06/26/2018   Mild mitral regurgitation, by echocardiogram December 2024 08/27/2023   NASH (nonalcoholic steatohepatitis) 03/12/2023   Obstructive sleep apnea syndrome 06/21/2023   Overweight (BMI 25.0-29.9) 06/21/2023   Pneumothorax    Rectal bleeding 06/21/2022   S/P CABG (coronary artery bypass graft) 06/26/2018   Sepsis (HCC) 06/26/2018   Traumatic diaphragmatic hernia    Repaired 07/01/2014   Type  2 diabetes mellitus (HCC)    Type 2 diabetes mellitus without complication, without long-term current use of insulin  (HCC) 03/12/2023    Past Surgical History:  Procedure Laterality Date   CAROTID ARTERY ANGIOPLASTY Left    2007   CORONARY ARTERY BYPASS GRAFT     x3   HEMORROIDECTOMY     INGUINAL HERNIA REPAIR Right 08/31/2014   Procedure: HERNIA REPAIR INGUINAL ADULT;  Surgeon: Beau Bound, MD;  Location: [facility];  Service: General;  Laterality: Right;   INGUINAL HERNIA REPAIR Left 06/12/2016   Procedure: HERNIA REPAIR INGUINAL ADULT WITH MESH;  Surgeon: Alanda Allegra, MD;  Location: [facility];  Service: General;   Laterality: Left;   INSERTION OF MESH Right 08/31/2014   Procedure: INSERTION OF MESH;  Surgeon: Beau Bound, MD;  Location: [facility];  Service: General;  Laterality: Right;  inguinal hernia   UMBILICAL HERNIA REPAIR N/A 08/31/2014   Procedure: HERNIA REPAIR UMBILICAL ADULT ;  Surgeon: Beau Bound, MD;  Location: [facility];  Service: General;  Laterality: N/A;   VIDEO ASSISTED THORACOSCOPY (VATS)/THOROCOTOMY Left 07/01/2014   Procedure: with repair left diaphragmatic hernia ;  Surgeon: Zelphia Higashi, MD;  Location: [facility] OR;  Service: Thoracic;  Laterality: Left;   VIDEO BRONCHOSCOPY N/A 07/01/2014   Procedure: VIDEO BRONCHOSCOPY;  Surgeon: Zelphia Higashi, MD;  Location[facility];  Service: Thoracic;  Laterality: N/A;    Current Medications: Current Meds  Medication Sig   acetaminophen  (TYLENOL ) 500 MG tablet Take 1,000 mg by mouth every 12 (twelve) hours as needed for moderate pain (pain score 4-6) or mild pain (pain score 1-3).   albuterol  (PROVENTIL  HFA;VENTOLIN  HFA) 108 (90 BASE) MCG/ACT inhaler Inhale 2 puffs into the lungs every 6 (six) hours as needed for wheezing or shortness of breath.   albuterol  (PROVENTIL ) (2.5 MG/3ML) 0.083% nebulizer solution Take 2.5 mg by nebulization every 4 (four) hours as needed for wheezing.   aspirin  EC 81 MG tablet Take 1 tablet (81 mg total) by mouth daily.   carvedilol  (COREG ) 25 MG tablet TAKE 1 TABLET BY MOUTH TWICE A DAY   dapagliflozin  propanediol (FARXIGA ) 10 MG TABS tablet Take 1 tablet (10 mg total) by mouth daily.   digoxin  (LANOXIN ) 0.125 MG tablet TAKE 1 TABLET BY MOUTH EVERY DAY   ipratropium (ATROVENT ) 0.02 % nebulizer solution Take 500 mcg by nebulization every 4 (four) hours as needed for wheezing.   metFORMIN  (GLUCOPHAGE ) 500 MG tablet Take 500 mg by mouth 2 (two) times daily with a meal. Patient takes 500 mg in the morning and 1000 mg at supper   sacubitril-valsartan (ENTRESTO ) 24-26 MG Take 1 tablet by mouth 2 (two) times daily.    sildenafil (VIAGRA) 100 MG tablet Take 100 mg by mouth as needed for erectile dysfunction.   simvastatin  (ZOCOR ) 40 MG tablet Take 40 mg by mouth every evening.   spironolactone  (ALDACTONE ) 25 MG tablet Take 0.5 tablets (12.5 mg total) by mouth daily.   tamsulosin (FLOMAX) 0.4 MG CAPS capsule Take 0.4 mg by mouth daily.     Allergies:   Prednisone    Social History   Socioeconomic History   Marital status: Married    Spouse name: Not on file   Number of children: Not on file   Years of education: Not on file   Highest education level: Not on file  Occupational History   Not on file  Tobacco Use   Smoking status: Former    Current packs/day: 0.00  Average packs/day: 0.5 packs/day for 48.0 years (24.0 ttl pk-yrs)    Types: Cigarettes    Start date: 06/23/1970    Quit date: 06/23/2018    Years since quitting: 5.5   Smokeless tobacco: Never  Vaping Use   Vaping status: Never Used  Substance and Sexual Activity   Alcohol use: No   Drug use: No   Sexual activity: Yes  Other Topics Concern   Not on file  Social History Narrative   Not on file   Social Drivers of Health   Financial Resource Strain: Not on file  Food Insecurity: Low Risk  (03/12/2023)   Received from [facility]   Hunger Vital Sign    Worried About Running Out of Food in the Last Year: Never true    Ran Out of Food in the Last Year: Never true  Transportation Needs: Not on file (03/12/2023)  Physical Activity: Not on file  Stress: Not on file  Social Connections: Not on file     Family History: The patient's family history includes Coronary artery disease in his father. There is no history of Colon cancer. ROS:   Please see the history of present illness.    All 14 point review of systems negative except as described per history of present illness.  EKGs/Labs/Other Studies Reviewed:    The following studies were reviewed today:   EKG:       Recent Labs: 07/08/2023: ALT 42; BUN 23;  Creatinine, Ser 1.12; Hemoglobin 16.2; Magnesium  1.5; NT-Pro BNP 267; Platelets 209; Potassium 4.3; Sodium 135  Recent Lipid Panel    Component Value Date/Time   CHOL 124 02/25/2022 1543   TRIG 506 (H) 02/25/2022 1543   HDL 24 (L) 02/25/2022 1543   CHOLHDL 5.2 02/25/2022 1543   VLDL UNABLE TO CALCULATE IF TRIGLYCERIDE OVER 400 mg/dL 16/05/9603 5409   LDLCALC UNABLE TO CALCULATE IF TRIGLYCERIDE OVER 400 mg/dL 81/19/1478 2956   LDLDIRECT 51.3 02/25/2022 1543    Physical Exam:    VS:  BP (!) 112/48   Pulse 94   Ht 5\' 7"  (1.702 m)   Wt 159 lb 9.6 oz (72.4 kg)   SpO2 91%   BMI 25.00 kg/m     Wt Readings from Last 3 Encounters:  12/30/23 159 lb 9.6 oz (72.4 kg)  09/12/23 169 lb 3.2 oz (76.7 kg)  07/08/23 169 lb 3.2 oz (76.7 kg)     GENERAL:  Well nourished, well developed in no acute distress NECK: No JVD; No carotid bruits CARDIAC: RRR, S1 and S2 present, no murmurs, no rubs, no gallops CHEST:  Clear to auscultation without rales, wheezing or rhonchi  Extremities: No pitting pedal edema. Pulses bilaterally symmetric with radial 2+ and dorsalis pedis 2+ NEUROLOGIC:  Alert and oriented x 3  Medication Adjustments/Labs and Tests Ordered: Current medicines are reviewed at length with the patient today.  Concerns regarding medicines are outlined above.  No orders of the defined types were placed in this encounter.  No orders of the defined types were placed in this encounter.   Signed, Shaleka Brines reddy Jericho Cieslik, MD, MPH, [facility]. 12/30/2023 2:41 PM    [facility]

## 2023-12-30 NOTE — Patient Instructions (Signed)
 Medication Instructions:  Your physician recommends that you continue on your current medications as directed. Please refer to the Current Medication list given to you today.  *If you need a refill on your cardiac medications before your next appointment, please call your pharmacy*   Lab Work: None Ordered If you have labs (blood work) drawn today and your tests are completely normal, you will receive your results only by: MyChart Message (if you have MyChart) OR A paper copy in the mail If you have any lab test that is abnormal or we need to change your treatment, we will call you to review the results.   Testing/Procedures: None Ordered   Follow-Up: At [facility], you and your health needs are our priority.  As part of our continuing mission to provide you with exceptional heart care, we have created designated Provider Care Teams.  These Care Teams include your primary Cardiologist (physician) and Advanced Practice Providers (APPs -  Physician Assistants and Nurse Practitioners) who all work together to provide you with the care you need, when you need it.  We recommend signing up for the patient portal called "MyChart".  Sign up information is provided on this After Visit Summary.  MyChart is used to connect with patients for Virtual Visits (Telemedicine).  Patients are able to view lab/test results, encounter notes, upcoming appointments, etc.  Non-urgent messages can be sent to your provider as well.   To learn more about what you can do with MyChart, go to [email or web address].    Your next appointment:   6 month

## 2023-12-30 NOTE — Assessment & Plan Note (Signed)
 CHF; - Cardiomyopathy reduced LVEF 25 to 30% initially diagnosed May 2010 at the time of his initial stroke -IntraOp TEE 04-06-2009 LVEF 25 to 30%, no LV clot - Echocardiogram in November 2015 LVEF 55 to 60% - Repeat echocardiogram December 2021 LVEF decreased to 20 to 25% - Echocardiogram August 2022 with LVEF improved slightly to 40 to 45% - echocardiogram January 2024 with LVEF reported 35%, felt relatively similar to prior echocardiogram and was continued on medical therapy. - Echocardiogram 07/28/2023 LVEF 35 to 40%, global hypokinesis in description, RV function mildly reduced, mild MR -Echocardiogram 12-26-2023 with reported LVEF 30 to 35%, grade 1 diastolic dysfunction, normal RV size and low normal function, RVSP 39 mmHg, mild MR  Appears euvolemic, compensated, NYHA class II. Has furosemide  available to use as needed but has not required it in many months.  Remains on low-dose digoxin  0.125 mg once a day.  Continues to show good compliance with his medications. Remains on carvedilol  25 mg twice daily Farxiga  10 mg once daily Entresto  24 mg / 26 mg twice daily Spironolactone  12.5 mg once daily. Recent blood work from 12-16-2023 creatinine 1.12 and EGFR 70. Potassium 5.1.

## 2023-12-30 NOTE — Assessment & Plan Note (Signed)
 Coronary artery disease; - Triple-vessel disease on cardiac cath May 2010, identified in the setting of workup after left-sided stroke secondary to left internal carotid artery disease - Revascularization delayed until left internal carotid artery stenting. -CABG three-vessel LIMA-LAD, SVG-PDA, SVG-OM1 August 2010 -Stress test with nuclear imaging January 2022 in the setting of drop in LVEF reported high risk study with reduced LVEF, prior MI findings, no ischemia was reported.  He was continued on medical therapy. - Cardiac PET stress completed January 2025 in the setting of reduced LVEF and symptoms of chest pressure and shortness of breath with exertion, showed high risk findings with ischemia in LCx and diagonal territories  Functional status remains stable at this time. Continue with aspirin  81 mg once daily Continue simvastatin  40 mg once daily From antianginal medication standpoint he remains on carvedilol  25 mg twice daily.  If any symptoms of chest pain or shortness of breath he is aware to notify us  right away.

## 2024-02-19 ENCOUNTER — Telehealth: Payer: Self-pay

## 2024-02-19 NOTE — Telephone Encounter (Signed)
 Received a fax notice in the [facility] Patient Assistance [employer] que that FARXIGA  shipment should arrive within 7-10 business days (see chart media).

## 2024-02-20 ENCOUNTER — Other Ambulatory Visit: Payer: Self-pay | Admitting: Cardiology

## 2024-03-01 ENCOUNTER — Other Ambulatory Visit: Payer: Self-pay | Admitting: Cardiology

## 2024-05-27 ENCOUNTER — Telehealth: Payer: Self-pay

## 2024-05-27 MED ORDER — SACUBITRIL-VALSARTAN 24-26 MG PO TABS: 1.0000 | ORAL_TABLET | Freq: Two times a day (BID) | ORAL | 2 refills | Status: AC

## 2024-05-27 NOTE — Telephone Encounter (Signed)
 Pt states that he received a letter that he would not receive pt assistance since Entresto  has gone generic and requested a RX be sent. RX sent as requested to pharmacy.

## 2024-05-27 NOTE — Telephone Encounter (Signed)
 Pt c/o medication issue:  1. Name of Medication:     sacubitril-valsartan (ENTRESTO ) 24-26 MG   2. How are you currently taking this medication (dosage and times per day)?   As prescribed  3. Are you having a reaction (difficulty breathing--STAT)?   4. What is your medication issue?   Patient stated he received a letter from [employer] which stated he will need to be prescribed the generic version of this medication.  Patient wants a call back to discuss next steps.

## 2024-06-07 ENCOUNTER — Telehealth: Payer: Self-pay

## 2024-06-07 NOTE — Telephone Encounter (Signed)
 Pt c/o medication issue:  1. Name of Medication:   sacubitril-valsartan (ENTRESTO ) 24-26 MG    2. How are you currently taking this medication (dosage and times per day)? As written  3. Are you having a reaction (difficulty breathing--STAT)? No   4. What is your medication issue? Pt said pharmacy told him we sent over name brand instead of generic. Please verify what was sent for patient. Please advise

## 2024-06-07 NOTE — Telephone Encounter (Signed)
 Left message at [facility] that it is ok to fill Entresto  with generic.

## 2024-06-29 ENCOUNTER — Ambulatory Visit

## 2024-06-29 NOTE — Progress Notes (Unsigned)
 Cardiology Consultation:    Date:  06/29/2024   ID:  Mark Rios, DOB 11-19-1950, MRN [id number]  PCP:  Dia Vina HERO, NP-C  Cardiologist:  Alean JONELLE Kobus, MD   Referring MD: Dia Vina HERO, NP-C   No chief complaint on file.    ASSESSMENT AND PLAN:   Mr Mark Rios 73 year old male Problem List Items Addressed This Visit   None     History of Present Illness:    Mark Rios is a 73 y.o. male who is being seen today for follow-up visit. PCP Dia Vina HERO, NP-C.  Last visit with me in the office was 12/30/2023.   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 Cardiac PET/CT stress test January 2025 noted ischemia in  small LAD territory and moderate LCx territory.  He preferred to hold off on further invasive workup and follow-up conservatively.    Past Medical History:  Diagnosis Date   Arthritis    Benign prostatic hyperplasia with urinary frequency 03/12/2023   CAD (coronary artery disease)    Multivessel status post CABG 2010 (LIMA to LAD, SVG to OM1, SVG to PDA - Dr. Kerrin)   Carotid artery disease    Cervical disc disease    CHF (congestive heart failure) (HCC), ischemic cardiomyopathy, reduced LVEF 35-40% by TTE 07/2023, NYHA class II 07/08/2023   CHF;  - Cardiomyopathy reduced LVEF 25 to 30% initially diagnosed May 2010 at the time of his initial stroke  -IntraOp TEE 04-06-2009 LVEF 25 to 30%, no LV clot  - Echocardiogram in November 2015 LVEF 55 to 60%  - Repeat echocardiogram  December 2021 LVEF decreased to 20 to 25%  - Echocardiogram August 2022 with LVEF improved slightly to 40 to 45%  - echocardiogram January 2024 with LVEF reported 35   COPD (chronic obstructive pulmonary disease) (HCC)    Diaphragmatic hernia without obstruction 06/29/2014   Repaired 07/01/2014     Diverticulitis of colon 06/21/2022   Dyspnea on exertion 07/08/2023   Erectile dysfunction    Essential hypertension    History of kidney stones        History of stroke 2010   HLD (hyperlipidemia) 06/26/2018   HTN (hypertension) 06/26/2018   Hypoxia 06/26/2018   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   Mild mitral regurgitation, by echocardiogram December 2024 08/27/2023   NASH (nonalcoholic steatohepatitis) 03/12/2023   Obstructive sleep apnea syndrome 06/21/2023   Overweight (BMI 25.0-29.9) 06/21/2023   Pneumothorax    Rectal bleeding 06/21/2022   S/P CABG (coronary artery bypass graft) 06/26/2018   Sepsis (HCC) 06/26/2018   Traumatic diaphragmatic hernia    Repaired 07/01/2014   Type 2 diabetes mellitus (HCC)    Type 2 diabetes mellitus without complication, without long-term current use of insulin  (HCC) 03/12/2023    Past Surgical History:  Procedure Laterality Date   CAROTID ARTERY ANGIOPLASTY Left    2007   CORONARY ARTERY BYPASS GRAFT     x3   HEMORROIDECTOMY     INGUINAL HERNIA REPAIR Right 08/31/2014   Procedure: HERNIA REPAIR INGUINAL ADULT;  Surgeon: Oneil DELENA Budge, MD;  Location: [facility];  Service: General;  Laterality: Right;   INGUINAL HERNIA REPAIR Left 06/12/2016   Procedure: HERNIA REPAIR INGUINAL ADULT WITH MESH;  Surgeon: Oneil Budge, MD;  Location: [facility];  Service: General;  Laterality: Left;   INSERTION OF MESH Right 08/31/2014   Procedure: INSERTION OF MESH;  Surgeon: Oneil DELENA Budge, MD;  Location: [facility];  Service: General;  Laterality: Right;  inguinal hernia   UMBILICAL HERNIA REPAIR N/A 08/31/2014   Procedure: HERNIA REPAIR UMBILICAL  ADULT ;  Surgeon: Oneil DELENA Budge, MD;  Location: [facility];  Service: General;  Laterality: N/A;   VIDEO ASSISTED THORACOSCOPY (VATS)/THOROCOTOMY Left 07/01/2014   Procedure: with repair left diaphragmatic hernia ;  Surgeon: Elspeth JAYSON Millers, MD;  Location: [facility];  Service: Thoracic;  Laterality: Left;   VIDEO BRONCHOSCOPY N/A 07/01/2014   Procedure: VIDEO BRONCHOSCOPY;  Surgeon: Elspeth JAYSON Millers, MD;  Location: [facility] OR;  Service: Thoracic;  Laterality: N/A;    Current Medications: No outpatient medications have been marked as taking for the 06/29/24 encounter (Appointment) with Tykeshia Tourangeau, Alean SAUNDERS, MD.     Allergies:   Prednisone    Social History   Socioeconomic History   Marital status: Married    Spouse name: Not on file   Number of children: Not on file   Years of education: Not on file   Highest education level: Not on file  Occupational History   Not on file  Tobacco Use   Smoking status: Former    Current packs/day: 0.00    Average packs/day: 0.5 packs/day for 48.0 years (24.0 ttl pk-yrs)    Types: Cigarettes    Start date: 06/23/1970    Quit date: 06/23/2018    Years since quitting: 6.0   Smokeless tobacco: Never  Vaping Use   Vaping status: Never Used  Substance and Sexual Activity   Alcohol use: No   Drug use: No   Sexual activity: Yes  Other Topics Concern   Not on file  Social History Narrative   Not on file   Social Drivers of Health   Financial Resource Strain: Not on file  Food Insecurity: Low Risk  (06/16/2024)   Received from [facility]   Hunger Vital Sign    Within the past 12 months, you worried that your food would run out before you got money to buy more: Never true    Within the past 12 months, the food you bought just didn't last and you didn't have money to get more. : Never true  Transportation Needs: No Transportation Needs (06/16/2024)   Received from [facility]    In the past 12 months, has lack of reliable  transportation kept you from medical appointments, meetings, work or from getting things needed for daily living? : No  Physical Activity: Not on file  Stress: Not on file  Social Connections: Not on file     Family History: The patient's family history includes Coronary artery disease in his father. There is no history of Colon cancer. ROS:   Please see the history of present illness.    All 14 point review of systems negative except as described per history of present illness.  EKGs/Labs/Other Studies Reviewed:    The following studies were reviewed today:   EKG:       Recent Labs: 07/08/2023: ALT 42; BUN 23; Creatinine, Ser 1.12; Hemoglobin 16.2; Magnesium  1.5; NT-Pro BNP 267; Platelets 209; Potassium 4.3; Sodium  135  Recent Lipid Panel    Component Value Date/Time   CHOL 124 02/25/2022 1543   TRIG 506 (H) 02/25/2022 1543   HDL 24 (L) 02/25/2022 1543   CHOLHDL 5.2 02/25/2022 1543   VLDL UNABLE TO CALCULATE IF TRIGLYCERIDE OVER 400 mg/dL 92/89/7976 8456   LDLCALC UNABLE TO CALCULATE IF TRIGLYCERIDE OVER 400 mg/dL 92/89/7976 8456   LDLDIRECT 51.3 02/25/2022 1543    Physical Exam:    VS:  There were no vitals taken for this visit.    Wt Readings from Last 3 Encounters:  12/30/23 159 lb 9.6 oz (72.4 kg)  09/12/23 169 lb 3.2 oz (76.7 kg)  07/08/23 169 lb 3.2 oz (76.7 kg)     GENERAL:  Well nourished, well developed in no acute distress NECK: No JVD; No carotid bruits CARDIAC: RRR, S1 and S2 present, no murmurs, no rubs, no gallops CHEST:  Clear to auscultation without rales, wheezing or rhonchi  Extremities: No pitting pedal edema. Pulses bilaterally symmetric with radial 2+ and dorsalis pedis 2+ NEUROLOGIC:  Alert and oriented x 3  Medication Adjustments/Labs and Tests Ordered: Current medicines are reviewed at length with the patient today.  Concerns regarding medicines are outlined above.  No orders of the defined types were placed in this encounter.  No orders  of the defined types were placed in this encounter.   Signed, Alean jess Kobus, MD, MPH, [facility]. 06/29/2024 3:15 PM    [facility]

## 2024-07-02 ENCOUNTER — Telehealth: Payer: Self-pay | Admitting: Pharmacy Technician

## 2024-07-02 NOTE — Telephone Encounter (Signed)
 Received notification from [facility] [employer]  that a shipment was mailed out:

## 2024-07-09 ENCOUNTER — Ambulatory Visit

## 2024-07-09 VITALS — BP 100/50 | HR 94 | Ht 67.0 in | Wt 150.8 lb

## 2024-07-09 DIAGNOSIS — I5022 Chronic systolic (congestive) heart failure: Secondary | ICD-10-CM | POA: Diagnosis not present

## 2024-07-09 DIAGNOSIS — I25708 Atherosclerosis of coronary artery bypass graft(s), unspecified, with other forms of angina pectoris: Secondary | ICD-10-CM

## 2024-07-09 DIAGNOSIS — E782 Mixed hyperlipidemia: Secondary | ICD-10-CM | POA: Diagnosis not present

## 2024-07-09 MED ORDER — ATORVASTATIN CALCIUM 40 MG PO TABS: 40.0000 mg | ORAL_TABLET | Freq: Every day | ORAL | 3 refills | Status: AC

## 2024-07-09 NOTE — Assessment & Plan Note (Signed)
 Lipid panel from 06/16/2024 otal cholesterol 112, triglycerides 214, HDL 25, LDL 59.   Optimal. Target LDL less than 55 mg/dL. Titrate up atorvastatin  40 mg a day.

## 2024-07-09 NOTE — Assessment & Plan Note (Addendum)
 CHF; - Cardiomyopathy reduced LVEF 25 to 30% initially diagnosed May 2010 at the time of his initial stroke -IntraOp TEE 04-06-2009 LVEF 25 to 30%, no LV clot - Echocardiogram in November 2015 LVEF 55 to 60% - Repeat echocardiogram December 2021 LVEF decreased to 20 to 25% - Echocardiogram August 2022 with LVEF improved slightly to 40 to 45% - echocardiogram January 2024 with LVEF reported 35%, felt relatively similar to prior echocardiogram and was continued on medical therapy. - Echocardiogram 07/28/2023 LVEF 35 to 40%, global hypokinesis in description, RV function mildly reduced, mild MR -Echocardiogram 12-26-2023 with reported LVEF 30 to 35%, grade 1 diastolic dysfunction, normal RV size and low normal function, RVSP 39 mmHg, mild MR   Euvolemic and compensated. NYHA class II. Has not required loop diuretic. Continue with low-salt diet less than 2 g/day.  Remains on low-dose digoxin  0.125 mg once a day.  Continue guideline directed medical therapy. On carvedilol  25 mg twice daily Farxiga  10 mg once daily Entresto  24-26 mg twice daily Spironolactone  25 mg once daily. Further uptitration limited due to blood pressures.  Recent lab work from 06/16/2024 with BUN 20, creatinine 1.21 and GFR 64, potassium 4.4 and sodium 138.  Follow-up repeat echocardiogram tentatively in 6 months for annual assessment of LVEF in the setting of gradually declining LVEF over last few TTE's as any further significant drop in function will prompt discussion about revascularization options vs ICD for primary prevention of sudden cardiac death.

## 2024-07-09 NOTE — Progress Notes (Signed)
 Cardiology Consultation:    Date:  07/09/2024   ID:  Mark Rios, DOB May 05, 1951, MRN [id number]  PCP:  Mark Vina HERO, NP-C  Cardiologist:  Mark JONELLE Kobus, MD   Referring MD: Mark Vina HERO, NP-C   No chief complaint on file.    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  He preferred to hold off on further invasive workup and follow-up conservatively as his symptoms improved.   Here for routine follow-up visit.   Problem List Items Addressed This Visit     HLD (hyperlipidemia)   Lipid panel from 06/16/2024 otal cholesterol 112, triglycerides 214, HDL 25, LDL 59.   Optimal. Target LDL less than 55 mg/dL. Titrate up atorvastatin  40 mg a day.       Relevant Medications   atorvastatin  (LIPITOR) 40 MG tablet   CAD (coronary artery disease), 3vd on cath 12/2008; s/p CABG (LIMA-LAD, SVG-OM1, SVG-PDA 03/2009); last stress Cardiac PET 08/2023 abnormal with ischemia LCx and Diag territory   Coronary artery disease; - Triple-vessel disease on cardiac cath May 2010, identified in the setting of workup after left-sided stroke secondary to left internal carotid artery disease - Revascularization delayed until left internal carotid artery stenting. -CABG three-vessel LIMA-LAD, SVG-PDA, SVG-OM1 August 2010 -Stress test with nuclear imaging January 2022 in the setting of drop in LVEF  reported high risk study with reduced LVEF, prior MI findings, no ischemia was reported.  He was continued on medical therapy. - Cardiac PET stress completed January 2025 in the setting of reduced LVEF and symptoms of chest pressure and shortness of breath with exertion, showed high risk findings with ischemia in LCx and diagonal territories.  Remains with good functional status. No symptoms suggestive of angina or decline in functional status at this time. Wishes to continue with medical therapy. Continue aspirin  81 mg once daily Continue atorvastatin , titrating up the dose as discussed below under hyperlipidemia.  Continue carvedilol  25 mg twice daily.        Relevant Medications   atorvastatin  (LIPITOR) 40 MG tablet   Other Relevant Orders   ECHOCARDIOGRAM COMPLETE   CHF (congestive heart failure) (HCC), ischemic cardiomyopathy, reduced LVEF 35 to% by TTE 12/2023, NYHA class II - Primary   CHF; - Cardiomyopathy reduced LVEF 25 to 30% initially diagnosed May 2010 at the time of his initial stroke -IntraOp TEE 04-06-2009 LVEF 25 to 30%, no LV clot - Echocardiogram in November 2015 LVEF 55 to 60% - Repeat echocardiogram December 2021 LVEF decreased to 20 to 25% - Echocardiogram August 2022 with LVEF improved slightly to 40 to 45% - echocardiogram January 2024 with LVEF reported 35%, felt relatively similar to prior echocardiogram and was continued on medical therapy. - Echocardiogram 07/28/2023 LVEF 35 to 40%, global hypokinesis in description, RV function mildly reduced, mild MR -Echocardiogram 12-26-2023 with reported LVEF 30 to 35%, grade 1 diastolic dysfunction, normal RV size and low normal function, RVSP 39 mmHg,  mild MR   Euvolemic and compensated. NYHA class II. Has not required loop diuretic. Continue with low-salt diet less than 2 g/day.  Remains on low-dose digoxin  0.125 mg once a day.  Continue guideline directed medical therapy. On carvedilol  25 mg twice daily Farxiga  10  mg once daily Entresto  24-26 mg twice daily Spironolactone  25 mg once daily. Further uptitration limited due to blood pressures.  Recent lab work from 06/16/2024 with BUN 20, creatinine 1.21 and GFR 64, potassium 4.4 and sodium 138.  Follow-up repeat echocardiogram tentatively in 6 months for annual assessment of LVEF in the setting of gradually declining LVEF over last few TTE's as any further significant drop in function will prompt discussion about revascularization options vs ICD for primary prevention of sudden cardiac death.      Relevant Medications   atorvastatin  (LIPITOR) 40 MG tablet   Other Relevant Orders   ECHOCARDIOGRAM COMPLETE   Return to clinic tentatively in 6 months with follow-up echocardiogram prior to the visit.   History of Present Illness:    Mark Rios is a 73 y.o. male who is being seen today for follow-up visit. PCP Mark Vina HERO, NP-C. Last visit with me in the office was 12/30/2023.  Pleasant man here for the visit by himself.  Lives with his wife at home.  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 Cardiac PET/CT stress test January 2025 noted ischemia and small LAD territory and moderate LCx territory.  He preferred to hold off on further invasive workup and follow-up conservatively as his symptoms improved.   Has been doing his activities at home and outdoors with lawn work without any significant limitations.  He mentions he paces himself. Denies any  symptoms of chest pain, shortness of breath, orthopnea, paroxysmal nocturnal dyspnea.  No pedal edema.  No lightheadedness, dizziness or syncopal episodes. Does report occasional trouble breathing with dry cough that improves with nebulizer therapy.  Good compliance with his medications. Denies any side effects.  Blood work from 06/16/2024 notes total cholesterol 112, triglycerides 214, HDL 25, LDL 59. Hemoglobin A1c 6.2 CBC with hemoglobin 17.7, hematocrit 53, WBC 6.7, platelets 173 CMP with sodium 138, potassium 4.4 Normal ALT and alkaline phosphatase. AST low 10. BUN 20, creatinine 1.21, eGFR 64. Past Medical History:  Diagnosis Date   Arthritis    Benign prostatic hyperplasia with urinary frequency 03/12/2023   CAD (coronary artery disease)    Multivessel status post CABG 2010 (LIMA to LAD, SVG to OM1, SVG to PDA - Dr. Kerrin)   Carotid artery disease    Cervical disc disease    CHF (congestive heart failure) (HCC), ischemic cardiomyopathy, reduced LVEF 35-40% by TTE 07/2023, NYHA class II 07/08/2023   CHF;  - Cardiomyopathy reduced LVEF 25 to 30% initially diagnosed May 2010 at the time of his initial stroke  -IntraOp TEE 04-06-2009 LVEF 25 to 30%, no LV clot  - Echocardiogram in November 2015 LVEF 55 to 60%  - Repeat echocardiogram December 2021 LVEF decreased to 20 to 25%  - Echocardiogram August  2022 with LVEF improved slightly to 40 to 45%  - echocardiogram January 2024 with LVEF reported 35   COPD (chronic obstructive pulmonary disease) (HCC)    Diaphragmatic hernia without obstruction 06/29/2014   Repaired 07/01/2014     Diverticulitis of colon 06/21/2022   Dyspnea on exertion 07/08/2023   Erectile dysfunction    Essential hypertension    History of kidney stones        History of stroke 2010   HLD (hyperlipidemia) 06/26/2018   HTN (hypertension) 06/26/2018   Hypoxia 06/26/2018   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   Mild mitral regurgitation,  by echocardiogram December 2024 08/27/2023   NASH (nonalcoholic steatohepatitis) 03/12/2023   Obstructive sleep apnea syndrome 06/21/2023   Overweight (BMI 25.0-29.9) 06/21/2023   Pneumothorax    Rectal bleeding 06/21/2022   S/P CABG (coronary artery bypass graft) 06/26/2018   Sepsis (HCC) 06/26/2018   Traumatic diaphragmatic hernia    Repaired 07/01/2014   Type 2 diabetes mellitus (HCC)    Type 2 diabetes mellitus without complication, without long-term current use of insulin  (HCC) 03/12/2023    Past Surgical History:  Procedure Laterality Date   CAROTID ARTERY ANGIOPLASTY Left    2007   CORONARY ARTERY BYPASS GRAFT     x3   HEMORROIDECTOMY     INGUINAL HERNIA REPAIR Right 08/31/2014   Procedure: HERNIA REPAIR INGUINAL ADULT;  Surgeon: Oneil DELENA Budge, MD;  Location: [facility];  Service: General;  Laterality: Right;   INGUINAL HERNIA REPAIR Left 06/12/2016   Procedure: HERNIA REPAIR INGUINAL ADULT WITH MESH;  Surgeon: Oneil Budge, MD;  Location: [facility];  Service: General;  Laterality: Left;   INSERTION OF MESH Right 08/31/2014   Procedure: INSERTION OF MESH;  Surgeon: Oneil DELENA Budge, MD;  Location: [facility];  Service: General;  Laterality: Right;  inguinal hernia   UMBILICAL HERNIA REPAIR N/A 08/31/2014   Procedure: HERNIA REPAIR UMBILICAL ADULT ;  Surgeon: Oneil DELENA Budge, MD;  Location: [facility];  Service: General;  Laterality: N/A;   VIDEO ASSISTED THORACOSCOPY (VATS)/THOROCOTOMY Left 07/01/2014   Procedure: with repair left diaphragmatic hernia ;  Surgeon: Elspeth JAYSON Millers, MD;  Location: [facility] OR;  Service: Thoracic;  Laterality: Left;   VIDEO BRONCHOSCOPY N/A 07/01/2014   Procedure: VIDEO BRONCHOSCOPY;  Surgeon: Elspeth JAYSON Millers, MD;  Location: [facility] OR;  Service: Thoracic;  Laterality: N/A;    Current Medications: Current Meds  Medication Sig   acetaminophen  (TYLENOL ) 500 MG tablet Take 1,000 mg by mouth every 12 (twelve) hours as needed for moderate pain (pain score 4-6) or mild  pain (pain score 1-3).   albuterol  (PROVENTIL  HFA;VENTOLIN  HFA) 108 (90 BASE) MCG/ACT inhaler Inhale 2 puffs into the lungs every 6 (six) hours as needed for wheezing or shortness of breath.   albuterol  (PROVENTIL ) (2.5 MG/3ML) 0.083% nebulizer solution Take 2.5 mg by nebulization every 4 (four) hours as needed for wheezing.   aspirin  EC 81 MG tablet Take 1 tablet (81 mg total) by mouth daily.   carvedilol  (COREG ) 25 MG tablet TAKE 1 TABLET BY MOUTH TWICE A DAY   dapagliflozin  propanediol (FARXIGA ) 10 MG TABS tablet Take 1 tablet (10 mg total) by mouth daily.   digoxin  (LANOXIN ) 0.125 MG tablet TAKE 1 TABLET BY MOUTH EVERY DAY   ipratropium (ATROVENT ) 0.02 % nebulizer solution Take 500 mcg by nebulization every 4 (four) hours as needed for wheezing.   metFORMIN  (GLUCOPHAGE ) 500 MG tablet Take 500 mg by mouth 2 (two) times  daily with a meal. Patient takes 500 mg in the morning and 1000 mg at supper   sacubitril -valsartan  (ENTRESTO ) 24-26 MG Take 1 tablet by mouth 2 (two) times daily.   sildenafil (VIAGRA) 100 MG tablet Take 100 mg by mouth as needed for erectile dysfunction.   spironolactone  (ALDACTONE ) 25 MG tablet TAKE 1/2 TABLET BY MOUTH DAILY   tamsulosin (FLOMAX) 0.4 MG CAPS capsule Take 0.4 mg by mouth daily.   [DISCONTINUED] atorvastatin  (LIPITOR) 20 MG tablet Take 20 mg by mouth at bedtime.     Allergies:   Prednisone    Social History   Socioeconomic History   Marital status: Married    Spouse name: Not on file   Number of children: Not on file   Years of education: Not on file   Highest education level: Not on file  Occupational History   Not on file  Tobacco Use   Smoking status: Former    Current packs/day: 0.00    Average packs/day: 0.5 packs/day for 48.0 years (24.0 ttl pk-yrs)    Types: Cigarettes    Start date: 06/23/1970    Quit date: 06/23/2018    Years since quitting: 6.0   Smokeless tobacco: Never  Vaping Use   Vaping status: Never Used  Substance and Sexual  Activity   Alcohol use: No   Drug use: No   Sexual activity: Yes  Other Topics Concern   Not on file  Social History Narrative   Not on file   Social Drivers of Health   Financial Resource Strain: Not on file  Food Insecurity: Low Risk  (06/16/2024)   Received from [facility]   Hunger Vital Sign    Within the past 12 months, you worried that your food would run out before you got money to buy more: Never true    Within the past 12 months, the food you bought just didn't last and you didn't have money to get more. : Never true  Transportation Needs: No Transportation Needs (06/16/2024)   Received from [facility]    In the past 12 months, has lack of reliable transportation kept you from medical appointments, meetings, work or from getting things needed for daily living? : No  Physical Activity: Not on file  Stress: Not on file  Social Connections: Not on file     Family History: The patient's family history includes Coronary artery disease in his father. There is no history of Colon cancer. ROS:   Please see the history of present illness.    All 14 point review of systems negative except as described per history of present illness.  EKGs/Labs/Other Studies Reviewed:    The following studies were reviewed today:   EKG:       Recent Labs: No results found for requested labs within last 365 days.  Recent Lipid Panel    Component Value Date/Time   CHOL 124 02/25/2022 1543   TRIG 506 (H) 02/25/2022 1543   HDL 24 (L) 02/25/2022 1543   CHOLHDL 5.2 02/25/2022 1543   VLDL UNABLE TO CALCULATE IF TRIGLYCERIDE OVER 400 mg/dL 92/89/7976 8456   LDLCALC UNABLE TO CALCULATE IF TRIGLYCERIDE OVER 400 mg/dL 92/89/7976 8456   LDLDIRECT 51.3 02/25/2022 1543    Physical Exam:    VS:  BP (!) 100/50   Pulse 94   Ht 5' 7 (1.702 m)   Wt 150 lb 12.8 oz (68.4 kg)   SpO2 95%   BMI 23.62 kg/m  Wt Readings from Last 3 Encounters:  07/09/24 150 lb 12.8 oz  (68.4 kg)  12/30/23 159 lb 9.6 oz (72.4 kg)  09/12/23 169 lb 3.2 oz (76.7 kg)     GENERAL:  Well nourished, well developed in no acute distress NECK: No JVD; No carotid bruits CARDIAC: RRR, S1 and S2 present, no murmurs, no rubs, no gallops CHEST:  Clear to auscultation without rales, wheezing or rhonchi  Extremities: No pitting pedal edema. Pulses bilaterally symmetric with radial 2+ and dorsalis pedis 2+ NEUROLOGIC:  Alert and oriented x 3  Medication Adjustments/Labs and Tests Ordered: Current medicines are reviewed at length with the patient today.  Concerns regarding medicines are outlined above.  Orders Placed This Encounter  Procedures   ECHOCARDIOGRAM COMPLETE   Meds ordered this encounter  Medications   atorvastatin  (LIPITOR) 40 MG tablet    Sig: Take 1 tablet (40 mg total) by mouth at bedtime.    Dispense:  90 tablet    Refill:  3    Signed, Augustin Bun reddy Ka Flammer, MD, MPH, [facility]. 07/09/2024 2:58 PM    [facility]

## 2024-07-09 NOTE — Patient Instructions (Addendum)
 Medication Instructions:  Your physician has recommended you make the following change in your medication:   Increase your Atorvastatin  to 40 mg daily  *If you need a refill on your cardiac medications before your next appointment, please call your pharmacy*   Lab Work: None ordered If you have labs (blood work) drawn today and your tests are completely normal, you will receive your results only by: MyChart Message (if you have MyChart) OR A paper copy in the mail If you have any lab test that is abnormal or we need to change your treatment, we will call you to review the results.  Testing/Procedures: Your physician has requested that you have an echocardiogram prior to your 6 month FU. Echocardiography is a painless test that uses sound waves to create images of your heart. It provides your doctor with information about the size and shape of your heart and how well your heart's chambers and valves are working. This procedure takes approximately one hour. There are no restrictions for this procedure. Please do NOT wear cologne, perfume, aftershave, or lotions (deodorant is allowed). Please arrive 15 minutes prior to your appointment time.  Please note: We ask at that you not bring children with you during ultrasound (echo/ vascular) testing. Due to room size and safety concerns, children are not allowed in the ultrasound rooms during exams. Our front office staff cannot provide observation of children in our lobby area while testing is being conducted. An adult accompanying a patient to their appointment will only be allowed in the ultrasound room at the discretion of the ultrasound technician under special circumstances. We apologize for any inconvenience.  Follow-Up: At [facility], you and your health needs are our priority.  As part of our continuing mission to provide you with exceptional heart care, we have created designated Provider Care Teams.  These Care Teams include your primary  Cardiologist (physician) and Advanced Practice Providers (APPs -  Physician Assistants and Nurse Practitioners) who all work together to provide you with the care you need, when you need it.  We recommend signing up for the patient portal called MyChart.  Sign up information is provided on this After Visit Summary.  MyChart is used to connect with patients for Virtual Visits (Telemedicine).  Patients are able to view lab/test results, encounter notes, upcoming appointments, etc.  Non-urgent messages can be sent to your provider as well.   To learn more about what you can do with MyChart, go to [email or web address].    Your next appointment:   6 month(s)  The format for your next appointment:   In Person  Provider:   Alean Madireddy, MD   Other Instructions Echocardiogram An echocardiogram is a test that uses sound waves (ultrasound) to produce images of the heart. Images from an echocardiogram can provide important information about: Heart size and shape. The size and thickness and movement of your heart's walls. Heart muscle function and strength. Heart valve function or if you have stenosis. Stenosis is when the heart valves are too narrow. If blood is flowing backward through the heart valves (regurgitation). A tumor or infectious growth around the heart valves. Areas of heart muscle that are not working well because of poor blood flow or injury from a heart attack. Aneurysm detection. An aneurysm is a weak or damaged part of an artery wall. The wall bulges out from the normal force of blood pumping through the body. Tell a health care provider about: Any allergies you have. All medicines you are  taking, including vitamins, herbs, eye drops, creams, and over-the-counter medicines. Any blood disorders you have. Any surgeries you have had. Any medical conditions you have. Whether you are pregnant or may be pregnant. What are the risks? Generally, this is a safe test.  However, problems may occur, including an allergic reaction to dye (contrast) that may be used during the test. What happens before the test? No specific preparation is needed. You may eat and drink normally. What happens during the test? You will take off your clothes from the waist up and put on a hospital gown. Electrodes or electrocardiogram (ECG)patches may be placed on your chest. The electrodes or patches are then connected to a device that monitors your heart rate and rhythm. You will lie down on a table for an ultrasound exam. A gel will be applied to your chest to help sound waves pass through your skin. A handheld device, called a transducer, will be pressed against your chest and moved over your heart. The transducer produces sound waves that travel to your heart and bounce back (or echo back) to the transducer. These sound waves will be captured in real-time and changed into images of your heart that can be viewed on a video monitor. The images will be recorded on a computer and reviewed by your health care provider. You may be asked to change positions or hold your breath for a short time. This makes it easier to get different views or better views of your heart. In some cases, you may receive contrast through an IV in one of your veins. This can improve the quality of the pictures from your heart. The procedure may vary among health care providers and hospitals.   What can I expect after the test? You may return to your normal, everyday life, including diet, activities, and medicines, unless your health care provider tells you not to do that. Follow these instructions at home: It is up to you to get the results of your test. Ask your health care provider, or the department that is doing the test, when your results will be ready. Keep all follow-up visits. This is important. Summary An echocardiogram is a test that uses sound waves (ultrasound) to produce images of the heart. Images  from an echocardiogram can provide important information about the size and shape of your heart, heart muscle function, heart valve function, and other possible heart problems. You do not need to do anything to prepare before this test. You may eat and drink normally. After the echocardiogram is completed, you may return to your normal, everyday life, unless your health care provider tells you not to do that. This information is not intended to replace advice given to you by your health care provider. Make sure you discuss any questions you have with your health care provider. Document Revised: 03/28/2020 Document Reviewed: 03/28/2020 Elsevier Patient Education  2021 Elsevier Inc.   Important Information About Sugar

## 2024-07-09 NOTE — Assessment & Plan Note (Signed)
 Coronary artery disease; - Triple-vessel disease on cardiac cath May 2010, identified in the setting of workup after left-sided stroke secondary to left internal carotid artery disease - Revascularization delayed until left internal carotid artery stenting. -CABG three-vessel LIMA-LAD, SVG-PDA, SVG-OM1 August 2010 -Stress test with nuclear imaging January 2022 in the setting of drop in LVEF reported high risk study with reduced LVEF, prior MI findings, no ischemia was reported.  He was continued on medical therapy. - Cardiac PET stress completed January 2025 in the setting of reduced LVEF and symptoms of chest pressure and shortness of breath with exertion, showed high risk findings with ischemia in LCx and diagonal territories.  Remains with good functional status. No symptoms suggestive of angina or decline in functional status at this time. Wishes to continue with medical therapy. Continue aspirin  81 mg once daily Continue atorvastatin , titrating up the dose as discussed below under hyperlipidemia.  Continue carvedilol  25 mg twice daily.

## 2024-09-16 ENCOUNTER — Telehealth: Payer: Self-pay | Admitting: Pharmacy Technician

## 2024-09-16 MED ORDER — DAPAGLIFLOZIN PROPANEDIOL 10 MG PO TABS: 10.0000 mg | ORAL_TABLET | Freq: Every day | ORAL | 2 refills | Status: AC

## 2024-09-16 NOTE — Addendum Note (Signed)
 Addended by: Dolan Xia, ANNABELLA L on: 09/16/2024 12:55 PM   Modules accepted: Orders

## 2024-09-16 NOTE — Telephone Encounter (Signed)
 Farxiga  refill has been sent to [facility].

## 2024-09-16 NOTE — Telephone Encounter (Signed)
 Hi, can the patient's farxiga  refill please be sent to [facility] ? Thank you!

## 2024-09-24 ENCOUNTER — Other Ambulatory Visit: Payer: Self-pay | Admitting: Cardiology

## 2025-01-06 ENCOUNTER — Ambulatory Visit
